# Patient Record
Sex: Female | Born: 1950
Health system: Southern US, Community
[De-identification: ages and names within clinical notes are randomized; demographics above are authoritative.]

## PROBLEM LIST (undated history)

## (undated) DIAGNOSIS — D649 Anemia, unspecified: Secondary | ICD-10-CM

## (undated) DIAGNOSIS — M1990 Unspecified osteoarthritis, unspecified site: Secondary | ICD-10-CM

## (undated) DIAGNOSIS — R51 Headache: Secondary | ICD-10-CM

## (undated) DIAGNOSIS — F419 Anxiety disorder, unspecified: Secondary | ICD-10-CM

## (undated) DIAGNOSIS — I10 Essential (primary) hypertension: Secondary | ICD-10-CM

## (undated) DIAGNOSIS — Z8669 Personal history of other diseases of the nervous system and sense organs: Secondary | ICD-10-CM

## (undated) DIAGNOSIS — F439 Reaction to severe stress, unspecified: Secondary | ICD-10-CM

## (undated) DIAGNOSIS — G4700 Insomnia, unspecified: Secondary | ICD-10-CM

## (undated) DIAGNOSIS — R519 Headache, unspecified: Secondary | ICD-10-CM

## (undated) DIAGNOSIS — G40409 Other generalized epilepsy and epileptic syndromes, not intractable, without status epilepticus: Secondary | ICD-10-CM

## (undated) HISTORY — DX: Essential (primary) hypertension: I10

## (undated) HISTORY — DX: Unspecified osteoarthritis, unspecified site: M19.90

## (undated) HISTORY — DX: Anxiety disorder, unspecified: F41.9

## (undated) HISTORY — DX: Reaction to severe stress, unspecified: F43.9

## (undated) HISTORY — DX: Personal history of other diseases of the nervous system and sense organs: Z86.69

## (undated) HISTORY — DX: Other generalized epilepsy and epileptic syndromes, not intractable, without status epilepticus: G40.409

## (undated) HISTORY — DX: Insomnia, unspecified: G47.00

---
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
## 2005-10-19 ENCOUNTER — Emergency Department (HOSPITAL_COMMUNITY): Admission: EM | Admit: 2005-10-19 | Discharge: 2005-10-19 | Payer: Self-pay | Admitting: Emergency Medicine

## 2008-02-11 ENCOUNTER — Ambulatory Visit (HOSPITAL_COMMUNITY): Admission: RE | Admit: 2008-02-11 | Discharge: 2008-02-11 | Payer: Self-pay | Admitting: Family Medicine

## 2013-02-16 ENCOUNTER — Other Ambulatory Visit: Payer: Self-pay | Admitting: Family Medicine

## 2013-03-20 ENCOUNTER — Other Ambulatory Visit: Payer: Self-pay | Admitting: Family Medicine

## 2013-04-24 ENCOUNTER — Other Ambulatory Visit: Payer: Self-pay | Admitting: Family Medicine

## 2013-04-27 ENCOUNTER — Other Ambulatory Visit: Payer: Self-pay | Admitting: Family Medicine

## 2013-04-27 ENCOUNTER — Telehealth: Payer: Self-pay | Admitting: Family Medicine

## 2013-04-27 MED ORDER — ENALAPRIL MALEATE 20 MG PO TABS: ORAL_TABLET | ORAL | Status: DC

## 2013-04-27 NOTE — Telephone Encounter (Signed)
Ok plus 2 eref

## 2013-04-27 NOTE — Telephone Encounter (Signed)
Meds reprinted and faxed to [facility] in [place]. Patient notified.

## 2013-04-27 NOTE — Telephone Encounter (Signed)
Pt called and stated that [facility] did not receive the refill for her Xanax or her Enalapril.  Please resend.  Please call pt to let her know it's been done.

## 2013-05-05 ENCOUNTER — Ambulatory Visit (INDEPENDENT_AMBULATORY_CARE_PROVIDER_SITE_OTHER): Payer: Self-pay | Admitting: Family Medicine

## 2013-05-05 ENCOUNTER — Encounter: Payer: Self-pay | Admitting: Family Medicine

## 2013-05-05 VITALS — BP 138/88 | Temp 98.7°F | Ht 63.0 in | Wt 171.0 lb

## 2013-05-05 DIAGNOSIS — J329 Chronic sinusitis, unspecified: Secondary | ICD-10-CM

## 2013-05-05 MED ORDER — AMOXICILLIN 500 MG PO CAPS: 500.0000 mg | ORAL_CAPSULE | Freq: Three times a day (TID) | ORAL | Status: DC

## 2013-05-05 NOTE — Progress Notes (Signed)
  Subjective:    Patient ID: Melissa Wiley, female    DOB: [birth year], 62 y.o.   MRN: [id number]  Wheezing  This is a new problem. The current episode started in the past 7 days. Associated symptoms include coughing, headaches and a sore throat.    Cough is generally not prod, no measurable fever.  Achey, energy level low  Review of Systems  HENT: Positive for sore throat.   Respiratory: Positive for cough and wheezing.   Neurological: Positive for headaches.       Objective:   Physical Exam Alert good hydration. HEENT moderate nasal congestion. Frontal tenderness vitals stable alert no acute distress lungs clear heart regular rate and rhythm       Assessment & Plan:  There impression #1 rhinosinusitis. Plan amoxicillin  3 times a day for 10 days. Symptomatic care discussed. Encouraged o followu for chrnic concrns.

## 2013-05-12 ENCOUNTER — Other Ambulatory Visit: Payer: Self-pay | Admitting: *Deleted

## 2013-05-12 ENCOUNTER — Telehealth: Payer: Self-pay | Admitting: Family Medicine

## 2013-05-12 MED ORDER — CIPROFLOXACIN HCL 500 MG PO TABS: 500.0000 mg | ORAL_TABLET | Freq: Two times a day (BID) | ORAL | Status: AC

## 2013-05-12 MED ORDER — HYDROCODONE-HOMATROPINE 5-1.5 MG/5ML PO SYRP: ORAL_SOLUTION | ORAL | Status: DC

## 2013-05-12 NOTE — Telephone Encounter (Signed)
meds sent to [facility] [place]. Pt notified

## 2013-05-12 NOTE — Telephone Encounter (Signed)
Pt still coughing and it keeps her up at night, can she get some cough meds with codeine so she can sleep. She also wants to know if you can call in a stronger Antibiotic? She coughs day and night, wet cough with nothing coming up. [facility] [place]

## 2013-05-12 NOTE — Telephone Encounter (Signed)
cipro 500 bid tenday, hycodan 3 oz one tspn qhs prn

## 2013-06-16 ENCOUNTER — Other Ambulatory Visit: Payer: Self-pay | Admitting: *Deleted

## 2013-06-16 MED ORDER — PAROXETINE HCL 20 MG PO TABS: ORAL_TABLET | ORAL | Status: DC

## 2013-06-22 ENCOUNTER — Other Ambulatory Visit: Payer: Self-pay | Admitting: *Deleted

## 2013-06-22 MED ORDER — PAROXETINE HCL 20 MG PO TABS: ORAL_TABLET | ORAL | Status: DC

## 2013-07-18 ENCOUNTER — Encounter: Payer: Self-pay | Admitting: *Deleted

## 2013-07-20 ENCOUNTER — Ambulatory Visit (INDEPENDENT_AMBULATORY_CARE_PROVIDER_SITE_OTHER): Payer: Self-pay | Admitting: Nurse Practitioner

## 2013-07-20 ENCOUNTER — Encounter: Payer: Self-pay | Admitting: Nurse Practitioner

## 2013-07-20 VITALS — BP 148/90 | HR 80 | Ht 63.0 in | Wt 171.8 lb

## 2013-07-20 DIAGNOSIS — Z0000 Encounter for general adult medical examination without abnormal findings: Secondary | ICD-10-CM

## 2013-07-20 DIAGNOSIS — Z124 Encounter for screening for malignant neoplasm of cervix: Secondary | ICD-10-CM

## 2013-07-20 DIAGNOSIS — Z01419 Encounter for gynecological examination (general) (routine) without abnormal findings: Secondary | ICD-10-CM

## 2013-07-20 NOTE — Patient Instructions (Signed)
Re[place]  Luvena

## 2013-07-21 LAB — PAP IG W/ RFLX HPV ASCU

## 2013-07-24 LAB — LIPID PANEL
Cholesterol: 162 mg/dL (ref 0–200)
LDL Cholesterol: 90 mg/dL (ref 0–99)
Total CHOL/HDL Ratio: 3.6 Ratio
VLDL: 27 mg/dL (ref 0–40)

## 2013-07-24 LAB — BASIC METABOLIC PANEL
BUN: 15 mg/dL (ref 6–23)
CO2: 28 mEq/L (ref 19–32)
Chloride: 102 mEq/L (ref 96–112)
Creat: 0.74 mg/dL (ref 0.50–1.10)

## 2013-07-25 ENCOUNTER — Encounter: Payer: Self-pay | Admitting: Nurse Practitioner

## 2013-07-25 NOTE — Progress Notes (Signed)
  Subjective:    Patient ID: Melissa Wiley, female    DOB: [birth year], 62 y.o.   MRN: [id number]  HPI Presents for her wellness exam. Has not had a menstrual cycle since age 58. No pelvic pain. New partner x 10 months. Regular eye exams. Needs dental exam.  Has had flu vaccine. Self pay so limited on what can be ordered.   Review of Systems  Constitutional: Negative for fever, activity change, appetite change and fatigue.  HENT: Positive for rhinorrhea. Negative for dental problem, ear pain, hearing loss and sore throat.   Eyes: Negative for visual disturbance.  Respiratory: Negative for cough, chest tightness, shortness of breath and wheezing.   Cardiovascular: Negative for chest pain and leg swelling.  Gastrointestinal: Negative for nausea, vomiting, abdominal pain, diarrhea, constipation and blood in stool.  Genitourinary: Negative for dysuria, urgency, frequency, vaginal bleeding, vaginal discharge, enuresis, difficulty urinating and pelvic pain.       Objective:   Physical Exam  Vitals reviewed. Constitutional: She is oriented to person, place, and time. She appears well-developed. No distress.  HENT:  Right Ear: External ear normal.  Left Ear: External ear normal.  Mouth/Throat: Oropharynx is clear and moist.  Neck: Normal range of motion. Neck supple. No tracheal deviation present. No thyromegaly present.  Cardiovascular: Normal rate, regular rhythm and normal heart sounds.  Exam reveals no gallop.   No murmur heard. Pulmonary/Chest: Effort normal and breath sounds normal.  Abdominal: Soft. She exhibits no distension. There is no tenderness.  Genitourinary: Vagina normal and uterus normal. No vaginal discharge found.  Musculoskeletal: She exhibits no edema.  Lymphadenopathy:    She has no cervical adenopathy.  Neurological: She is alert and oriented to person, place, and time.  Skin: Skin is warm and dry. No rash noted.  Psychiatric: She has a normal mood and affect. Her  behavior is normal.  Breast exam: no masses; axillae no adenopathy. External GU: normal. Vagina: no discharge. Cervix normal in appearance, no CMT. Bimanual exam: no masses or tenderness. Rectal exam: normal; no stool for hemoccult.        Assessment & Plan:  Well woman exam  Screening for cervical cancer - Plan: Pap IG w/ reflex to HPV when ASC-U  Routine general medical examination at a health care facility - Plan: Basic metabolic panel, Lipid panel, MM Digital Screening, CANCELED: MM Digital Screening  Recommend daily vitamin D and calcium supplement, regular activity and healthy diet. Patient given instructions per nurse on how to access free mammogram. Because of lack of insurance, hold on Zostavax, Pneumovax, bone density and colonoscopy. Will discuss at next PE in one year.

## 2013-07-27 ENCOUNTER — Encounter: Payer: Self-pay | Admitting: Nurse Practitioner

## 2013-08-14 ENCOUNTER — Other Ambulatory Visit: Payer: Self-pay | Admitting: Family Medicine

## 2013-10-17 ENCOUNTER — Other Ambulatory Visit: Payer: Self-pay | Admitting: Family Medicine

## 2013-10-19 ENCOUNTER — Telehealth: Payer: Self-pay | Admitting: Family Medicine

## 2013-10-19 NOTE — Telephone Encounter (Signed)
Ok plus 5 ref 

## 2013-10-19 NOTE — Telephone Encounter (Signed)
Last seen 07/20/13.

## 2013-10-19 NOTE — Telephone Encounter (Signed)
i already authorized it

## 2013-10-19 NOTE — Telephone Encounter (Signed)
Rx faxed to pharmacy  

## 2013-10-19 NOTE — Telephone Encounter (Signed)
[facility] [place], she would like this before snow gets bad.

## 2013-12-14 ENCOUNTER — Encounter: Payer: Self-pay | Admitting: Nurse Practitioner

## 2013-12-14 ENCOUNTER — Ambulatory Visit: Payer: Medicare HMO | Admitting: Nurse Practitioner

## 2013-12-14 ENCOUNTER — Ambulatory Visit (INDEPENDENT_AMBULATORY_CARE_PROVIDER_SITE_OTHER): Payer: Medicare HMO | Admitting: Nurse Practitioner

## 2013-12-14 VITALS — BP 158/90 | Temp 98.4°F | Ht 63.0 in | Wt 179.0 lb

## 2013-12-14 DIAGNOSIS — R21 Rash and other nonspecific skin eruption: Secondary | ICD-10-CM

## 2013-12-14 MED ORDER — CLOBETASOL PROPIONATE 0.05 % EX CREA: 1.0000 "application " | TOPICAL_CREAM | Freq: Two times a day (BID) | CUTANEOUS | Status: DC

## 2013-12-14 NOTE — Patient Instructions (Signed)
Eczema Possible viral infection such as a type of flat wart

## 2013-12-16 ENCOUNTER — Encounter: Payer: Self-pay | Admitting: Nurse Practitioner

## 2013-12-16 NOTE — Progress Notes (Signed)
Subjective:  Presents with complaints of the rash on her mid back area for the past month. Slightly pruritic off-and-on. No relief with OTC meds. No fever. No headache. No known contacts. No known allergens.  Objective:   BP 158/90  Temp(Src) 98.4 F (36.9 C)  Ht 5\' 3"  (1.6 m)  Wt 179 lb (81.194 kg)  BMI 31.72 kg/m2 NAD. Alert, oriented. A cluster of discrete slightly raised well-defined small circular flat skintone papules noted in the mid back area with several scattered discrete lesions. No erythema. Nontender. Slightly dry with minimal scaling.  Assessment:Rash and nonspecific skin eruption  eczematous rash versus form of flat wart Plan: Meds ordered this encounter  Medications  . clobetasol cream (TEMOVATE) 0.05 %    Sig: Apply 1 application topically 2 (two) times daily.    Dispense:  30 g    Refill:  0    Order Specific Question:  Supervising Provider    Answer:  Merlyn AlbertLUKING, WILLIAM S [[id number]]   trial of clobetasol twice a day for 2 weeks. If rash has not significantly improved, patient to call back for dermatology referral. Call back sooner if symptoms worsen.

## 2014-02-03 ENCOUNTER — Encounter: Payer: Self-pay | Admitting: Family Medicine

## 2014-02-03 ENCOUNTER — Ambulatory Visit (INDEPENDENT_AMBULATORY_CARE_PROVIDER_SITE_OTHER): Payer: Medicare HMO | Admitting: Family Medicine

## 2014-02-03 VITALS — BP 136/88 | Temp 98.9°F | Ht 63.0 in | Wt 175.4 lb

## 2014-02-03 DIAGNOSIS — J310 Chronic rhinitis: Secondary | ICD-10-CM

## 2014-02-03 DIAGNOSIS — J329 Chronic sinusitis, unspecified: Secondary | ICD-10-CM

## 2014-02-03 DIAGNOSIS — I10 Essential (primary) hypertension: Secondary | ICD-10-CM

## 2014-02-03 DIAGNOSIS — J309 Allergic rhinitis, unspecified: Secondary | ICD-10-CM

## 2014-02-03 MED ORDER — FLUTICASONE PROPIONATE 50 MCG/ACT NA SUSP: 2.0000 | Freq: Every day | NASAL | Status: DC

## 2014-02-03 MED ORDER — CEPHALEXIN 500 MG PO CAPS: 500.0000 mg | ORAL_CAPSULE | Freq: Three times a day (TID) | ORAL | Status: DC

## 2014-02-03 NOTE — Progress Notes (Signed)
   Subjective:    Patient ID: Melissa Wiley, female    DOB: [birth year], 63 y.o.   MRN: [id number]  HPI  Patient arrives with complaint of seasonal allergies for a while.  Patient stated she was doing better but has started getting worse again the last week.  Spring allergies hitting hard  Using allegra daily, has helped some  Two weeks or so  Cough a lot  Feels lump in thre throat  Lot of drainage  Non productive cough  Diminished enrgy, sleeping a lot    Review of Systems No vomiting no diarrhea no rash no fever    Objective:   Physical Exam Alert mild malaise frontal tenderness pharynx slight erythema neck supple. Lungs clear. Heart regular in rhythm.       Assessment & Plan:  Impression allergic rhinitis with secondary sinusitis/bronchitis plan antibiotics prescribed. Add Flonase to the over-the-counter Allegra. Local measures discussed. WSL

## 2014-02-08 ENCOUNTER — Other Ambulatory Visit: Payer: Self-pay | Admitting: Family Medicine

## 2014-03-31 ENCOUNTER — Other Ambulatory Visit: Payer: Self-pay | Admitting: Family Medicine

## 2014-05-13 ENCOUNTER — Other Ambulatory Visit: Payer: Self-pay | Admitting: Family Medicine

## 2014-06-14 ENCOUNTER — Other Ambulatory Visit: Payer: Self-pay | Admitting: Family Medicine

## 2014-06-14 NOTE — Telephone Encounter (Signed)
Ok plus 5 ref 

## 2014-08-12 ENCOUNTER — Other Ambulatory Visit: Payer: Self-pay | Admitting: Family Medicine

## 2014-09-20 ENCOUNTER — Other Ambulatory Visit: Payer: Self-pay | Admitting: Family Medicine

## 2014-10-14 ENCOUNTER — Other Ambulatory Visit: Payer: Self-pay | Admitting: Family Medicine

## 2014-10-26 ENCOUNTER — Encounter: Payer: Self-pay | Admitting: Family Medicine

## 2014-10-26 ENCOUNTER — Ambulatory Visit (INDEPENDENT_AMBULATORY_CARE_PROVIDER_SITE_OTHER): Payer: Medicare HMO | Admitting: Family Medicine

## 2014-10-26 VITALS — BP 138/86 | Ht 63.0 in | Wt 176.8 lb

## 2014-10-26 DIAGNOSIS — F32A Depression, unspecified: Secondary | ICD-10-CM

## 2014-10-26 DIAGNOSIS — M1712 Unilateral primary osteoarthritis, left knee: Secondary | ICD-10-CM

## 2014-10-26 DIAGNOSIS — I10 Essential (primary) hypertension: Secondary | ICD-10-CM

## 2014-10-26 DIAGNOSIS — F329 Major depressive disorder, single episode, unspecified: Secondary | ICD-10-CM

## 2014-10-26 DIAGNOSIS — G4700 Insomnia, unspecified: Secondary | ICD-10-CM

## 2014-10-26 DIAGNOSIS — M129 Arthropathy, unspecified: Secondary | ICD-10-CM

## 2014-10-26 MED ORDER — PAROXETINE HCL 20 MG PO TABS: ORAL_TABLET | ORAL | Status: DC

## 2014-10-26 MED ORDER — ENALAPRIL MALEATE 20 MG PO TABS: ORAL_TABLET | ORAL | Status: DC

## 2014-10-26 MED ORDER — HYDROCHLOROTHIAZIDE 25 MG PO TABS: 12.5000 mg | ORAL_TABLET | Freq: Every day | ORAL | Status: DC

## 2014-10-26 MED ORDER — ALPRAZOLAM 0.5 MG PO TABS: 0.5000 mg | ORAL_TABLET | Freq: Two times a day (BID) | ORAL | Status: DC | PRN

## 2014-10-26 NOTE — Progress Notes (Signed)
   Subjective:    Patient ID: Melissa Wiley, female    DOB: 1951/01/07, 64 y.o.   MRN: [id number]  Hypertension This is a chronic problem. The current episode started more than 1 year ago. Risk factors for coronary artery disease include post-menopausal state. Treatments tried: vasotec, hctz. There are no compliance problems.     Discuss arthritis. Knees bilat pain.advil or aleave helps, pain is worse on the right. Working as a Patent examinervolunterer  paxil sticking with , still helping. No obvious side effects with it. Definitely help her mood.  Still sleeping well at night. Xanax definitely helps in this regard.  Seeing eye doctor about developing cataracts ,   Review of Systems No headache no chest pain and back pain abdominal pain no change in bowel habits no blood in stool    Objective:   Physical Exam Alert no apparent distress lungs clear heart rare rhythm HEENT normal blood pressure good on repeat. Hands positive Heberden's nodes knees bilateral crepitations left greater than right no effusion no joint laxity       Assessment & Plan:  Impression knee arthritis discussed treated well symptomatically with when necessary Aleve No. 2 hypertension good control discussed #3 insomnia good control discussed #4 depression good control discussed plan diet exercise discussed. Medications refilled. Check every 6 months. Appropriate blood work. WSL

## 2014-10-29 LAB — BASIC METABOLIC PANEL
BUN: 14 mg/dL (ref 6–23)
CHLORIDE: 100 meq/L (ref 96–112)
CO2: 30 meq/L (ref 19–32)
CREATININE: 0.73 mg/dL (ref 0.50–1.10)
Calcium: 9.3 mg/dL (ref 8.4–10.5)
Glucose, Bld: 92 mg/dL (ref 70–99)
POTASSIUM: 4 meq/L (ref 3.5–5.3)
SODIUM: 139 meq/L (ref 135–145)

## 2014-10-29 LAB — LIPID PANEL
CHOL/HDL RATIO: 3.4 ratio
Cholesterol: 146 mg/dL (ref 0–200)
HDL: 43 mg/dL — AB (ref >=46)
LDL CALC: 76 mg/dL (ref 0–99)
Triglycerides: 135 mg/dL (ref <150)
VLDL: 27 mg/dL (ref 0–40)

## 2014-11-03 ENCOUNTER — Encounter: Payer: Self-pay | Admitting: Family Medicine

## 2015-03-29 ENCOUNTER — Other Ambulatory Visit: Payer: Self-pay | Admitting: Family Medicine

## 2015-04-25 ENCOUNTER — Other Ambulatory Visit: Payer: Self-pay | Admitting: Family Medicine

## 2015-04-26 ENCOUNTER — Encounter: Payer: Self-pay | Admitting: Family Medicine

## 2015-04-26 ENCOUNTER — Ambulatory Visit (INDEPENDENT_AMBULATORY_CARE_PROVIDER_SITE_OTHER): Payer: Medicare HMO | Admitting: Family Medicine

## 2015-04-26 VITALS — BP 130/82 | Ht 63.0 in | Wt 172.2 lb

## 2015-04-26 DIAGNOSIS — J301 Allergic rhinitis due to pollen: Secondary | ICD-10-CM | POA: Diagnosis not present

## 2015-04-26 DIAGNOSIS — M129 Arthropathy, unspecified: Secondary | ICD-10-CM

## 2015-04-26 DIAGNOSIS — I10 Essential (primary) hypertension: Secondary | ICD-10-CM | POA: Diagnosis not present

## 2015-04-26 DIAGNOSIS — M1712 Unilateral primary osteoarthritis, left knee: Secondary | ICD-10-CM

## 2015-04-26 MED ORDER — PAROXETINE HCL 20 MG PO TABS: ORAL_TABLET | ORAL | Status: DC

## 2015-04-26 MED ORDER — ALPRAZOLAM 0.5 MG PO TABS: 0.5000 mg | ORAL_TABLET | Freq: Two times a day (BID) | ORAL | Status: DC | PRN

## 2015-04-26 MED ORDER — HYDROCHLOROTHIAZIDE 25 MG PO TABS: 12.5000 mg | ORAL_TABLET | Freq: Every day | ORAL | Status: DC

## 2015-04-26 MED ORDER — ENALAPRIL MALEATE 20 MG PO TABS: 20.0000 mg | ORAL_TABLET | Freq: Every day | ORAL | Status: DC

## 2015-04-26 NOTE — Progress Notes (Signed)
   Subjective:    Patient ID: Melissa Wiley, female    DOB: 1950-09-10, 64 y.o.   MRN: [id number]  Hypertension This is a chronic problem. The current episode started more than 1 year ago. Risk factors for coronary artery disease include post-menopausal state and stress. Treatments tried: hctz, vasotec. There are no compliance problems.     BP usually under 140 or sless syst  Pt exercising so so .  Staying active, [place] or twice per [place]  paxil doing well with current dose. No obvious side effects. Definitely has helped mood. Also states need Xanax for anxiety  Continues to have symptoms of allergic rhinitis. States uses intermittent anti-histamines with Flomax when necessary.  Ongoing stress with daughter who is severely compromised   Pt volunteering at the [employer]      Review of Systems Headache no chest pain no back pain abdominal pain no change in bowel habits    Objective:   Physical Exam  Alert vitals stable. HEENT normal. Blood pressure good on repeat. Lungs clear. Heart regular rhythm. Ankles edema.      Assessment & Plan:  Impression 1 hypertension good control discussed #2 insomnia good control discussed #3 depression good control #4 allergic rhinitis discussed #5 left knee arthritis patient brought this up in the visit and spoke for 5 minutes about her challenges with it. Exercise strongly encourage. When necessary Advil. Mild crepitations on exam. plan maintain all medications. Importance of exercise and diet discussed. Medications refilled. Recheck in 6 months. WSL

## 2015-07-07 ENCOUNTER — Other Ambulatory Visit: Payer: Self-pay | Admitting: *Deleted

## 2015-07-07 MED ORDER — ENALAPRIL MALEATE 20 MG PO TABS: 20.0000 mg | ORAL_TABLET | Freq: Every day | ORAL | Status: DC

## 2015-10-18 ENCOUNTER — Encounter: Payer: Self-pay | Admitting: Nurse Practitioner

## 2015-10-18 ENCOUNTER — Ambulatory Visit (INDEPENDENT_AMBULATORY_CARE_PROVIDER_SITE_OTHER): Payer: Medicare HMO | Admitting: Nurse Practitioner

## 2015-10-18 VITALS — BP 134/86 | Ht 63.0 in | Wt 175.1 lb

## 2015-10-18 DIAGNOSIS — I10 Essential (primary) hypertension: Secondary | ICD-10-CM | POA: Diagnosis not present

## 2015-10-18 DIAGNOSIS — Z124 Encounter for screening for malignant neoplasm of cervix: Secondary | ICD-10-CM

## 2015-10-18 DIAGNOSIS — Z1231 Encounter for screening mammogram for malignant neoplasm of breast: Secondary | ICD-10-CM | POA: Diagnosis not present

## 2015-10-18 DIAGNOSIS — Z23 Encounter for immunization: Secondary | ICD-10-CM | POA: Diagnosis not present

## 2015-10-18 DIAGNOSIS — Z0000 Encounter for general adult medical examination without abnormal findings: Secondary | ICD-10-CM | POA: Diagnosis not present

## 2015-10-18 DIAGNOSIS — Z1151 Encounter for screening for human papillomavirus (HPV): Secondary | ICD-10-CM

## 2015-10-18 DIAGNOSIS — R5383 Other fatigue: Secondary | ICD-10-CM | POA: Diagnosis not present

## 2015-10-18 DIAGNOSIS — R69 Illness, unspecified: Secondary | ICD-10-CM | POA: Diagnosis not present

## 2015-10-20 ENCOUNTER — Encounter: Payer: Self-pay | Admitting: Nurse Practitioner

## 2015-10-20 NOTE — Progress Notes (Signed)
Subjective:    Patient ID: Melissa Wiley, female    DOB: October 20, 1950, 65 y.o.   MRN: [id number]  HPI Presents for her wellness exam. No vaginal bleeding or pelvic pain. No sexual activity x 1 year. Regular vision exams. No dental exams. Active. Overall healthy diet. Has gained weight around her midsection.     Review of Systems  Constitutional: Negative for activity change, appetite change and fatigue.  HENT: Negative for dental problem, ear pain, sinus pressure and sore throat.   Respiratory: Negative for cough, chest tightness, shortness of breath and wheezing.   Cardiovascular: Negative for chest pain.  Gastrointestinal: Negative for nausea, vomiting, abdominal pain, diarrhea, constipation and abdominal distention.  Genitourinary: Negative for dysuria, urgency, frequency, vaginal bleeding, vaginal discharge, enuresis, difficulty urinating, genital sores and pelvic pain.       Objective:   Physical Exam  Constitutional: She is oriented to person, place, and time. She appears well-developed. No distress.  HENT:  Right Ear: External ear normal.  Left Ear: External ear normal.  Mouth/Throat: Oropharynx is clear and moist.  Neck: Normal range of motion. Neck supple. No tracheal deviation present. No thyromegaly present.  Cardiovascular: Normal rate, regular rhythm and normal heart sounds.  Exam reveals no gallop.   No murmur heard. Pulmonary/Chest: Effort normal and breath sounds normal.  Abdominal: Soft. She exhibits no distension. There is no tenderness.  Genitourinary: Vagina normal and uterus normal. No vaginal discharge found.  External GU: no rashes or lesions. Vagina: pale, no discharge; external irritation. Cervix normal in appearance; no CMT. Bimanual exam: no tenderness or obvious masses. Rectal exam: no masses; no stool for hemoccult.   Musculoskeletal: She exhibits no edema.  Lymphadenopathy:    She has no cervical adenopathy.  Neurological: She is alert and oriented  to person, place, and time.  Skin: Skin is warm and dry. No rash noted.  Psychiatric: She has a normal mood and affect. Her behavior is normal.  Vitals reviewed. Breast exam: no masses; axillae no adenopathy.         Assessment & Plan:   Problem List Items Addressed This Visit      Cardiovascular and Mediastinum   Essential hypertension, benign   Relevant Orders   POC Hemoccult Bld/Stl (3-Cd Home Screen)   Lipid panel   Hepatic function panel   Basic metabolic panel   TSH    Other Visit Diagnoses    Routine general medical examination at a health care facility    -  Primary    Relevant Orders    Pap IG and HPV (high risk) DNA detection    POC Hemoccult Bld/Stl (3-Cd Home Screen)    Lipid panel    Hepatic function panel    Basic metabolic panel    TSH    Screening for cervical cancer        Relevant Orders    Pap IG and HPV (high risk) DNA detection    POC Hemoccult Bld/Stl (3-Cd Home Screen)    Lipid panel    Hepatic function panel    Basic metabolic panel    TSH    Screening for HPV (human papillomavirus)        Relevant Orders    Pap IG and HPV (high risk) DNA detection    POC Hemoccult Bld/Stl (3-Cd Home Screen)    Lipid panel    Hepatic function panel    Basic metabolic panel    TSH    Need for vaccination  Relevant Orders    Pneumococcal polysaccharide vaccine 23-valent greater than or equal to 2yo subcutaneous/IM (Completed)    POC Hemoccult Bld/Stl (3-Cd Home Screen)    Visit for screening mammogram        Relevant Orders    POC Hemoccult Bld/Stl (3-Cd Home Screen)    Lipid panel    Hepatic function panel    Basic metabolic panel    TSH    MM SCREENING BREAST TOMO BILATERAL    Other fatigue        Relevant Orders    POC Hemoccult Bld/Stl (3-Cd Home Screen)    Lipid panel    Hepatic function panel    Basic metabolic panel    TSH      Defers colonoscopy and DEXA scan. Given Rx for Zostavax. Recommend daily vitamin D and calcium.  Return  in about 1 year (around 10/17/2016) for physical.  Routine follow up in 6 months.

## 2015-10-21 ENCOUNTER — Ambulatory Visit (HOSPITAL_COMMUNITY)
Admission: RE | Admit: 2015-10-21 | Discharge: 2015-10-21 | Disposition: A | Discharge status: Home / Self Care | Payer: Medicare HMO | Source: Ambulatory Visit | Attending: Nurse Practitioner | Admitting: Nurse Practitioner

## 2015-10-21 ENCOUNTER — Other Ambulatory Visit: Payer: Self-pay | Admitting: Nurse Practitioner

## 2015-10-21 ENCOUNTER — Ambulatory Visit (HOSPITAL_COMMUNITY): Payer: Self-pay

## 2015-10-21 ENCOUNTER — Ambulatory Visit (HOSPITAL_COMMUNITY): Admission: RE | Admit: 2015-10-21 | Payer: Medicare HMO | Source: Ambulatory Visit

## 2015-10-21 DIAGNOSIS — Z1231 Encounter for screening mammogram for malignant neoplasm of breast: Secondary | ICD-10-CM

## 2015-10-21 DIAGNOSIS — R5383 Other fatigue: Secondary | ICD-10-CM | POA: Diagnosis not present

## 2015-10-21 DIAGNOSIS — Z0000 Encounter for general adult medical examination without abnormal findings: Secondary | ICD-10-CM | POA: Diagnosis not present

## 2015-10-21 DIAGNOSIS — N959 Unspecified menopausal and perimenopausal disorder: Secondary | ICD-10-CM | POA: Diagnosis not present

## 2015-10-21 DIAGNOSIS — I10 Essential (primary) hypertension: Secondary | ICD-10-CM | POA: Diagnosis not present

## 2015-10-21 DIAGNOSIS — R69 Illness, unspecified: Secondary | ICD-10-CM | POA: Diagnosis not present

## 2015-10-21 DIAGNOSIS — Z124 Encounter for screening for malignant neoplasm of cervix: Secondary | ICD-10-CM | POA: Diagnosis not present

## 2015-10-22 LAB — HEPATIC FUNCTION PANEL
ALBUMIN: 4.4 g/dL (ref 3.6–4.8)
ALT: 12 IU/L (ref 0–32)
AST: 13 IU/L (ref 0–40)
Alkaline Phosphatase: 66 IU/L (ref 39–117)
BILIRUBIN TOTAL: 0.3 mg/dL (ref 0.0–1.2)
Bilirubin, Direct: 0.07 mg/dL (ref 0.00–0.40)
TOTAL PROTEIN: 7.4 g/dL (ref 6.0–8.5)

## 2015-10-22 LAB — BASIC METABOLIC PANEL
BUN/Creatinine Ratio: 17 (ref 11–26)
BUN: 12 mg/dL (ref 8–27)
CALCIUM: 9.5 mg/dL (ref 8.7–10.3)
CHLORIDE: 99 mmol/L (ref 96–106)
CO2: 27 mmol/L (ref 18–29)
CREATININE: 0.69 mg/dL (ref 0.57–1.00)
GFR, EST AFRICAN AMERICAN: 105 mL/min/{1.73_m2} (ref >59)
GFR, EST NON AFRICAN AMERICAN: 91 mL/min/{1.73_m2} (ref >59)
Glucose: 95 mg/dL (ref 65–99)
Potassium: 3.9 mmol/L (ref 3.5–5.2)
Sodium: 142 mmol/L (ref 134–144)

## 2015-10-22 LAB — LIPID PANEL
CHOL/HDL RATIO: 4.3 ratio (ref 0.0–4.4)
Cholesterol, Total: 185 mg/dL (ref 100–199)
HDL: 43 mg/dL (ref >39)
LDL CALC: 100 mg/dL — AB (ref 0–99)
Triglycerides: 208 mg/dL — ABNORMAL HIGH (ref 0–149)
VLDL Cholesterol Cal: 42 mg/dL — ABNORMAL HIGH (ref 5–40)

## 2015-10-22 LAB — TSH: TSH: 1.05 u[IU]/mL (ref 0.450–4.500)

## 2015-10-25 LAB — PAP IG AND HPV HIGH-RISK
HPV, high-risk: NEGATIVE
PAP SMEAR COMMENT: 0

## 2015-11-07 ENCOUNTER — Other Ambulatory Visit: Payer: Self-pay | Admitting: Family Medicine

## 2015-11-07 NOTE — Telephone Encounter (Signed)
Ok six mo worth 

## 2016-02-21 ENCOUNTER — Other Ambulatory Visit: Payer: Self-pay | Admitting: Family Medicine

## 2016-03-09 DIAGNOSIS — H5210 Myopia, unspecified eye: Secondary | ICD-10-CM | POA: Diagnosis not present

## 2016-05-03 ENCOUNTER — Other Ambulatory Visit: Payer: Self-pay | Admitting: Family Medicine

## 2016-05-31 ENCOUNTER — Other Ambulatory Visit: Payer: Self-pay | Admitting: Family Medicine

## 2016-06-01 NOTE — Telephone Encounter (Signed)
30 d only, needs o v

## 2016-06-04 DIAGNOSIS — R69 Illness, unspecified: Secondary | ICD-10-CM | POA: Diagnosis not present

## 2016-06-11 ENCOUNTER — Ambulatory Visit (INDEPENDENT_AMBULATORY_CARE_PROVIDER_SITE_OTHER): Payer: Medicare HMO | Admitting: Family Medicine

## 2016-06-11 ENCOUNTER — Encounter: Payer: Self-pay | Admitting: Family Medicine

## 2016-06-11 VITALS — BP 136/80 | Ht 63.0 in | Wt 176.0 lb

## 2016-06-11 DIAGNOSIS — I10 Essential (primary) hypertension: Secondary | ICD-10-CM | POA: Diagnosis not present

## 2016-06-11 DIAGNOSIS — R69 Illness, unspecified: Secondary | ICD-10-CM | POA: Diagnosis not present

## 2016-06-11 DIAGNOSIS — R21 Rash and other nonspecific skin eruption: Secondary | ICD-10-CM

## 2016-06-11 DIAGNOSIS — F3341 Major depressive disorder, recurrent, in partial remission: Secondary | ICD-10-CM

## 2016-06-11 DIAGNOSIS — G4709 Other insomnia: Secondary | ICD-10-CM

## 2016-06-11 MED ORDER — HYDROCHLOROTHIAZIDE 25 MG PO TABS: 12.5000 mg | ORAL_TABLET | Freq: Every day | ORAL | 5 refills | Status: DC

## 2016-06-11 MED ORDER — PAROXETINE HCL 20 MG PO TABS: ORAL_TABLET | ORAL | 5 refills | Status: DC

## 2016-06-11 MED ORDER — FLUTICASONE PROPIONATE 50 MCG/ACT NA SUSP: 2.0000 | Freq: Every day | NASAL | 0 refills | Status: DC

## 2016-06-11 MED ORDER — ALPRAZOLAM 0.5 MG PO TABS: 0.5000 mg | ORAL_TABLET | Freq: Two times a day (BID) | ORAL | 5 refills | Status: DC | PRN

## 2016-06-11 MED ORDER — HYDROCORTISONE 2 % EX LOTN: TOPICAL_LOTION | CUTANEOUS | 3 refills | Status: DC

## 2016-06-11 MED ORDER — ENALAPRIL MALEATE 20 MG PO TABS
20.0000 mg | ORAL_TABLET | Freq: Every day | ORAL | 5 refills | Status: DC
Start: 2016-06-11 — End: 2016-10-22

## 2016-06-11 NOTE — Progress Notes (Signed)
   Subjective:    Patient ID: Melissa Wiley, female    DOB: [birth year], 65 y.o.   MRN: [id number]  Hypertension  This is a chronic problem. The current episode started more than 1 year ago. There are no compliance problems.    Blood pressure medicine and blood pressure levels reviewed today with patient. Compliant with blood pressure medicine. States does not miss a dose. No obvious side effects. Blood pressure generally good when checked elsewhere. Watching salt intake.  Patient notes ongoing compliance with antidepressant medication. No obvious side effects. Reports does not miss a dose. Overall continues to help depression substantially. No thoughts of homicide or suicide. Would like to maintain medication.  Pt tries to exercise once per wk   Patient would like to discuss itchy rash to back. Two yrs , itchy at itmes, off and on. Worse with stress. Patient notes a lot of stress lately.  Review of Systems No headache, no major weight loss or weight gain, no chest pain no back pain abdominal pain no change in bowel habits complete ROS otherwise negative     Objective:   Physical Exam  Alert vitals stable, NAD. Blood pressure good on repeat. HEENT normal. Lungs clear. Heart regular rate and rhythm. Affect appropriate rash nonspecific maculopapular rash on back with excoriations      Assessment & Plan:  Impression 1 hypertension good control discussed maintain same meds #2 depression with element of anxiety and insomnia. Discussed maintain same #3 rash nonspecific potentially related to #2. Plan hydrocortisone lotion twice a day affected area. Appropriate medications refilled. Diet exercise discussed. Patient to try to bump up exercises 3 times per week follow-up in 6 months for wellness plus chronic visit

## 2016-06-27 ENCOUNTER — Other Ambulatory Visit: Payer: Self-pay | Admitting: Family Medicine

## 2016-06-27 NOTE — Telephone Encounter (Signed)
Ok six mo worth 

## 2016-07-04 ENCOUNTER — Other Ambulatory Visit: Payer: Self-pay | Admitting: *Deleted

## 2016-07-04 ENCOUNTER — Telehealth: Payer: Self-pay | Admitting: Family Medicine

## 2016-07-04 MED ORDER — HYDROCORTISONE 2.5 % EX CREA: TOPICAL_CREAM | Freq: Two times a day (BID) | CUTANEOUS | 3 refills | Status: DC

## 2016-07-04 NOTE — Telephone Encounter (Signed)
Med sent to pharm. Pt notified.  

## 2016-07-04 NOTE — Telephone Encounter (Signed)
hydrocort 2.5% cr 60 g bid to affected area 3 ref

## 2016-07-04 NOTE — Telephone Encounter (Signed)
Prescribed 10/9 at office visit

## 2016-07-04 NOTE — Telephone Encounter (Signed)
Pt is needing something other than the HYDROCORTISONE, TOPICAL, 2 % LOTN Pt's insurance will not cover it. Please advise.    [facility] [place]

## 2016-09-17 ENCOUNTER — Other Ambulatory Visit: Payer: Self-pay | Admitting: Family Medicine

## 2016-09-17 DIAGNOSIS — Z1231 Encounter for screening mammogram for malignant neoplasm of breast: Secondary | ICD-10-CM

## 2016-10-01 ENCOUNTER — Telehealth: Payer: Self-pay | Admitting: Family Medicine

## 2016-10-01 DIAGNOSIS — I10 Essential (primary) hypertension: Secondary | ICD-10-CM

## 2016-10-01 DIAGNOSIS — Z79899 Other long term (current) drug therapy: Secondary | ICD-10-CM

## 2016-10-01 NOTE — Telephone Encounter (Signed)
Bloodwork ordered. Left message on voicemail notifying patient.  

## 2016-10-01 NOTE — Telephone Encounter (Signed)
Patient needing lab work done has appointment on 2/19 for physical.

## 2016-10-01 NOTE — Telephone Encounter (Signed)
Lip liv m7 

## 2016-10-22 ENCOUNTER — Encounter: Payer: Self-pay | Admitting: Nurse Practitioner

## 2016-10-22 ENCOUNTER — Ambulatory Visit (INDEPENDENT_AMBULATORY_CARE_PROVIDER_SITE_OTHER): Payer: Medicare HMO | Admitting: Nurse Practitioner

## 2016-10-22 VITALS — BP 126/80 | Ht 63.0 in | Wt 179.5 lb

## 2016-10-22 DIAGNOSIS — Z23 Encounter for immunization: Secondary | ICD-10-CM

## 2016-10-22 DIAGNOSIS — R69 Illness, unspecified: Secondary | ICD-10-CM | POA: Diagnosis not present

## 2016-10-22 DIAGNOSIS — Z0000 Encounter for general adult medical examination without abnormal findings: Secondary | ICD-10-CM | POA: Diagnosis not present

## 2016-10-22 DIAGNOSIS — F339 Major depressive disorder, recurrent, unspecified: Secondary | ICD-10-CM

## 2016-10-22 MED ORDER — HYDROCORTISONE 2.5 % EX CREA: TOPICAL_CREAM | Freq: Two times a day (BID) | CUTANEOUS | 0 refills | Status: DC

## 2016-10-22 MED ORDER — HYDROCHLOROTHIAZIDE 25 MG PO TABS: 12.5000 mg | ORAL_TABLET | Freq: Every day | ORAL | 1 refills | Status: DC

## 2016-10-22 MED ORDER — ENALAPRIL MALEATE 20 MG PO TABS: 20.0000 mg | ORAL_TABLET | Freq: Every day | ORAL | 5 refills | Status: DC

## 2016-10-22 MED ORDER — ALPRAZOLAM 0.5 MG PO TABS: 0.5000 mg | ORAL_TABLET | Freq: Two times a day (BID) | ORAL | 5 refills | Status: DC | PRN

## 2016-10-22 MED ORDER — PAROXETINE HCL 20 MG PO TABS: ORAL_TABLET | ORAL | 1 refills | Status: DC

## 2016-10-22 NOTE — Progress Notes (Signed)
Subjective:    Patient ID: Melissa Wiley, female    DOB: Apr 21, 1951, 66 y.o.   MRN: [id number]  HPI  66 yo female seen today for annual wellness exam.  1 month ago experienced loss of daughter who she was the primary caregiver to.  Is currently on Paxil  30mg  once daily and expressing concerns about if she is grieving appropriately or if her depression is not well controlled.  Denies any suicidal ideations.  Is sleeping more and decreased desire to participate in activities outside the house.  Also experiencing increased stress r/t financial strain and one of her living daughter's personal life struggles.  Has increased oral intake as well r/t stress.  Is taking Calcium/Vitamin D regularly.    She does get routine eye and dental exams completed, unable to identify dates.  Mammogram being done next week. Has not had colonoscopy or cologuard completed.  No significant family hx of colon cancer or other GI issues.  Had Pneumovax 23 completed last year, and due for Prevnar 13.  Due for other preventive health testing, including Hep C screen, & dexa scan.  Received flu shot this season.  Last PAP done last year, no new sexual partners.      Review of Systems  Constitutional: Negative for activity change, appetite change, fatigue and unexpected weight change.  HENT: Negative for dental problem, ear pain, sinus pressure and sore throat.   Respiratory: Negative for cough, chest tightness, shortness of breath and wheezing.   Cardiovascular: Negative for chest pain, palpitations and leg swelling.  Gastrointestinal: Negative for abdominal distention, abdominal pain, blood in stool, constipation, diarrhea, nausea and vomiting.  Genitourinary: Negative for difficulty urinating, dysuria, enuresis, frequency, genital sores, pelvic pain, urgency, vaginal bleeding and vaginal discharge.  Neurological: Negative for weakness and light-headedness.       Objective:   Physical Exam  Constitutional: She is  oriented to person, place, and time. She appears well-developed. No distress.  HENT:  Right Ear: External ear normal.  Left Ear: External ear normal.  Mouth/Throat: Oropharynx is clear and moist.  Neck: Normal range of motion. Neck supple. No tracheal deviation present. No thyromegaly present.  Cardiovascular: Normal rate, regular rhythm and normal heart sounds.  Exam reveals no gallop.   No murmur heard. Pulmonary/Chest: Effort normal and breath sounds normal.  Abdominal: Soft. She exhibits no distension. There is no tenderness.  Genitourinary:  Genitourinary Comments: Defers GU exam at this time.   Musculoskeletal: She exhibits no edema.  Lymphadenopathy:    She has no cervical adenopathy.  Neurological: She is alert and oriented to person, place, and time.  Skin: Skin is warm and dry. No rash noted.  Psychiatric: Her behavior is normal.  Calm affect, tearful at times   Breast:  Defers breast exam at this time.        Assessment & Plan:   Problem List Items Addressed This Visit      Other   Depression   Relevant Medications   PARoxetine (PAXIL) 20 MG tablet   ALPRAZolam (XANAX) 0.5 MG tablet    Other Visit Diagnoses    Routine general medical examination at a health care facility    -  Primary   Need for vaccination       Relevant Orders   Pneumococcal conjugate vaccine 13-valent IM (Completed)       Meds ordered this encounter  Medications  . hydrocortisone 2.5 % cream    Sig: Apply topically 2 (two) times daily.  Dispense:  60 g    Refill:  0    Order Specific Question:   Supervising Provider    Answer:   Merlyn AlbertLUKING, WILLIAM S [[id number]]  . hydrochlorothiazide (HYDRODIURIL) 25 MG tablet    Sig: Take 0.5 tablets (12.5 mg total) by mouth daily.    Dispense:  45 tablet    Refill:  1    Order Specific Question:   Supervising Provider    Answer:   Merlyn AlbertLUKING, WILLIAM S [[id number]]  . enalapril (VASOTEC) 20 MG tablet    Sig: Take 1 tablet (20 mg total) by mouth daily.     Dispense:  30 tablet    Refill:  5    Order Specific Question:   Supervising Provider    Answer:   Merlyn AlbertLUKING, WILLIAM S [[id number]]  . PARoxetine (PAXIL) 20 MG tablet    Sig: TAKE ONE AND ONE-HALF TABLET BY MOUTH ONCE DAILY    Dispense:  135 tablet    Refill:  1    Order Specific Question:   Supervising Provider    Answer:   Merlyn AlbertLUKING, WILLIAM S [[id number]]  . ALPRAZolam (XANAX) 0.5 MG tablet    Sig: Take 1 tablet (0.5 mg total) by mouth 2 (two) times daily as needed.    Dispense:  60 tablet    Refill:  5    Order Specific Question:   Supervising Provider    Answer:   Merlyn AlbertLUKING, WILLIAM S [[id number]]   Discussed routine screening and health maintenance in detail with patient.  Agrees to get Prevnar 13 today.  Chooses to defer other tests/exams today due to emotional and financial strain and is willing to revisit them in the near future.  Deferred both vaginal and breast exam at today's visit as well and again educated and agreed to complete in future.    Lab work pending.   Refills for medications provided.  Warning signs reviewed with patient.  Notify office for any changes in mood or behavior or any other questions or concerns.   Return in about 6 months (around 04/21/2017) for recheck.

## 2016-10-23 DIAGNOSIS — Z79899 Other long term (current) drug therapy: Secondary | ICD-10-CM | POA: Diagnosis not present

## 2016-10-23 DIAGNOSIS — I10 Essential (primary) hypertension: Secondary | ICD-10-CM | POA: Diagnosis not present

## 2016-10-24 ENCOUNTER — Ambulatory Visit (HOSPITAL_COMMUNITY)
Admission: RE | Admit: 2016-10-24 | Discharge: 2016-10-24 | Disposition: A | Discharge status: Home / Self Care | Payer: Medicare HMO | Source: Ambulatory Visit | Attending: Family Medicine | Admitting: Family Medicine

## 2016-10-24 ENCOUNTER — Encounter: Payer: Self-pay | Admitting: Family Medicine

## 2016-10-24 DIAGNOSIS — Z1231 Encounter for screening mammogram for malignant neoplasm of breast: Secondary | ICD-10-CM | POA: Diagnosis not present

## 2016-10-24 LAB — BASIC METABOLIC PANEL
BUN/Creatinine Ratio: 19 (ref 12–28)
BUN: 14 mg/dL (ref 8–27)
CALCIUM: 9.4 mg/dL (ref 8.7–10.3)
CO2: 25 mmol/L (ref 18–29)
CREATININE: 0.72 mg/dL (ref 0.57–1.00)
Chloride: 98 mmol/L (ref 96–106)
GFR calc Af Amer: 100 (ref >59)
GFR, EST NON AFRICAN AMERICAN: 87 (ref >59)
Glucose: 96 mg/dL (ref 65–99)
POTASSIUM: 3.9 mmol/L (ref 3.5–5.2)
Sodium: 141 mmol/L (ref 134–144)

## 2016-10-24 LAB — LIPID PANEL
Chol/HDL Ratio: 3.8 (ref 0.0–4.4)
Cholesterol, Total: 179 mg/dL (ref 100–199)
HDL: 47 mg/dL (ref >39)
LDL CALC: 100 — AB (ref 0–99)
Triglycerides: 161 mg/dL — ABNORMAL HIGH (ref 0–149)
VLDL Cholesterol Cal: 32 (ref 5–40)

## 2016-10-24 LAB — HEPATIC FUNCTION PANEL
ALBUMIN: 4.2 g/dL (ref 3.6–4.8)
ALT: 13 IU/L (ref 0–32)
AST: 15 IU/L (ref 0–40)
Alkaline Phosphatase: 71 IU/L (ref 39–117)
Bilirubin Total: 0.3 mg/dL (ref 0.0–1.2)
Bilirubin, Direct: 0.1 mg/dL (ref 0.00–0.40)
TOTAL PROTEIN: 7.1 g/dL (ref 6.0–8.5)

## 2016-11-22 ENCOUNTER — Encounter: Payer: Self-pay | Admitting: Family Medicine

## 2016-11-22 ENCOUNTER — Ambulatory Visit (INDEPENDENT_AMBULATORY_CARE_PROVIDER_SITE_OTHER): Payer: Medicare HMO | Admitting: Family Medicine

## 2016-11-22 VITALS — BP 144/90 | Temp 99.3°F | Ht 63.0 in | Wt 181.0 lb

## 2016-11-22 DIAGNOSIS — J029 Acute pharyngitis, unspecified: Secondary | ICD-10-CM | POA: Diagnosis not present

## 2016-11-22 DIAGNOSIS — J329 Chronic sinusitis, unspecified: Secondary | ICD-10-CM | POA: Diagnosis not present

## 2016-11-22 LAB — POCT RAPID STREP A (OFFICE): Rapid Strep A Screen: NEGATIVE

## 2016-11-22 MED ORDER — AMOXICILLIN-POT CLAVULANATE 875-125 MG PO TABS: 1.0000 | ORAL_TABLET | Freq: Two times a day (BID) | ORAL | 0 refills | Status: DC

## 2016-11-22 NOTE — Progress Notes (Signed)
   Subjective:    Patient ID: Lonna Cobbatricia J Larose, female    DOB: 07-Aug-1951, 66 y.o.   MRN: [id number]  Sore Throat   This is a new problem. Episode onset: 2 weeks. Maximum temperature: low grade fever. Associated symptoms include coughing and headaches. Treatments tried: salt water, allergy meds, robitussin     hit hard initially couple weeks ago  Felt bad achey, cough, some headache body aches  Then improved,  Returned this past week, cough and cong headache frontal, off and on, worse with cough, achey at times sharp at times   Review of Systems  Respiratory: Positive for cough.   Neurological: Positive for headaches.       Objective:   Physical Exam  Alert, mild malaise. Hydration good Vitals stable. frontal/ maxillary tenderness evident positive nasal congestion. pharynx normal neck supple  lungs clear/no crackles or wheezes. heart regular in rhythm       Assessment & Plan:  Impression rhinosinusitis likely post viral,spec post flu,  discussed with patient. plan antibiotics prescribed. Questions answered. Symptomatic care discussed. warning signs discussed. WSL

## 2017-01-09 ENCOUNTER — Other Ambulatory Visit: Payer: Self-pay

## 2017-01-09 MED ORDER — HYDROCORTISONE 2.5 % EX CREA: TOPICAL_CREAM | Freq: Two times a day (BID) | CUTANEOUS | 0 refills | Status: DC

## 2017-01-09 NOTE — Progress Notes (Signed)
Prescription sent electronically to pharmacy. 

## 2017-01-09 NOTE — Progress Notes (Signed)
Ok

## 2017-02-25 ENCOUNTER — Telehealth: Payer: Self-pay | Admitting: Nurse Practitioner

## 2017-02-25 NOTE — Telephone Encounter (Signed)
Left message return call 02/25/2017 

## 2017-02-25 NOTE — Telephone Encounter (Signed)
Patient has an appointment on 04/29/17 with Melissa Wiley.  She wants to know if she is due for labwork before or if she should wait until the visit?

## 2017-02-25 NOTE — Telephone Encounter (Signed)
No labs needed at this point. Will discuss more at visit.

## 2017-02-26 NOTE — Telephone Encounter (Signed)
Spoke with patient and informed her per Nathaneil Canaryarolyn Hoskins,NP- No labs needed at this point. Will discuss more visit at visit. Patient verbalized understanding.

## 2017-04-22 ENCOUNTER — Other Ambulatory Visit: Payer: Self-pay | Admitting: *Deleted

## 2017-04-24 MED ORDER — ENALAPRIL MALEATE 20 MG PO TABS: 20.0000 mg | ORAL_TABLET | Freq: Every day | ORAL | 0 refills | Status: DC

## 2017-04-24 NOTE — Telephone Encounter (Signed)
sched o v, 30 d worth

## 2017-04-29 ENCOUNTER — Ambulatory Visit (INDEPENDENT_AMBULATORY_CARE_PROVIDER_SITE_OTHER): Payer: Medicare HMO | Admitting: Nurse Practitioner

## 2017-04-29 ENCOUNTER — Encounter: Payer: Self-pay | Admitting: Nurse Practitioner

## 2017-04-29 VITALS — BP 118/76 | Ht 63.0 in | Wt 178.0 lb

## 2017-04-29 DIAGNOSIS — I10 Essential (primary) hypertension: Secondary | ICD-10-CM

## 2017-04-29 DIAGNOSIS — Z634 Disappearance and death of family member: Secondary | ICD-10-CM

## 2017-04-29 DIAGNOSIS — R69 Illness, unspecified: Secondary | ICD-10-CM | POA: Diagnosis not present

## 2017-04-29 MED ORDER — PHENTERMINE HCL 37.5 MG PO TABS: 37.5000 mg | ORAL_TABLET | Freq: Every day | ORAL | 0 refills | Status: DC

## 2017-04-30 ENCOUNTER — Encounter: Payer: Self-pay | Admitting: Nurse Practitioner

## 2017-04-30 NOTE — Progress Notes (Signed)
Subjective:  Patient presents for recheck on HTN. Compliant with medication. Concerned about weight gain especially around the waist area. No CP/ischemic type pain or SOB. Limited exercise. Has started sitting with elderly patients which she enjoys. Still dealing with the loss of her daughter a few months ago. Overall doing well.    Objective:   BP 118/76   Ht 5\' 3"  (1.6 m)   Wt 178 lb (80.7 kg)   BMI 31.53 kg/m  NAD. Alert, oriented. Lungs clear. Heart RRR. Carotids no bruits or thrills. LE: no edema. Waist circumfernce 39.5 in.  Assessment:   Problem List Items Addressed This Visit      Cardiovascular and Mediastinum   Essential hypertension, benign - Primary    Other Visit Diagnoses    Morbid obesity (HCC)       Relevant Medications   phentermine (ADIPEX-P) 37.5 MG tablet   Bereavement           Plan:   Meds ordered this encounter  Medications  . phentermine (ADIPEX-P) 37.5 MG tablet    Sig: Take 1 tablet (37.5 mg total) by mouth daily before breakfast.    Dispense:  30 tablet    Refill:  0    Order Specific Question:   Supervising Provider    Answer:   Riccardo Dubin   Wishes to try Phentermine. Discussed potential adverse effects. DC med and call if any problems. Increase activity. Limit sugar and simple carbs in diet.  Return in about 1 month (around 05/30/2017) for recheck.

## 2017-05-31 ENCOUNTER — Ambulatory Visit: Payer: Medicare HMO | Admitting: Nurse Practitioner

## 2017-06-03 ENCOUNTER — Other Ambulatory Visit: Payer: Self-pay | Admitting: Nurse Practitioner

## 2017-06-14 ENCOUNTER — Ambulatory Visit: Payer: Medicare HMO | Admitting: Nurse Practitioner

## 2017-07-05 ENCOUNTER — Other Ambulatory Visit: Payer: Self-pay | Admitting: Nurse Practitioner

## 2017-07-05 ENCOUNTER — Other Ambulatory Visit: Payer: Self-pay | Admitting: Family Medicine

## 2017-07-08 DIAGNOSIS — R69 Illness, unspecified: Secondary | ICD-10-CM | POA: Diagnosis not present

## 2017-07-16 ENCOUNTER — Encounter: Payer: Self-pay | Admitting: Family Medicine

## 2017-07-16 ENCOUNTER — Ambulatory Visit: Payer: Medicare HMO | Admitting: Family Medicine

## 2017-07-16 VITALS — BP 150/90 | HR 94 | Temp 100.5°F | Ht 63.0 in | Wt 176.1 lb

## 2017-07-16 DIAGNOSIS — R509 Fever, unspecified: Secondary | ICD-10-CM

## 2017-07-16 DIAGNOSIS — J019 Acute sinusitis, unspecified: Secondary | ICD-10-CM

## 2017-07-16 DIAGNOSIS — J029 Acute pharyngitis, unspecified: Secondary | ICD-10-CM | POA: Diagnosis not present

## 2017-07-16 LAB — POCT RAPID STREP A (OFFICE): RAPID STREP A SCREEN: NEGATIVE

## 2017-07-16 MED ORDER — AMOXICILLIN-POT CLAVULANATE 875-125 MG PO TABS: 1.0000 | ORAL_TABLET | Freq: Two times a day (BID) | ORAL | 0 refills | Status: DC

## 2017-07-16 NOTE — Progress Notes (Signed)
   Subjective:    Patient ID: Melissa Wiley, female    DOB: 31-Mar-1951, 66 y.o.   MRN: [id number]  HPI Patient is here today complaining of low grade fever,cough,scratchy throat,headache,body aches for a week now. Has tried zycam,cough drops.Has had some increased pulse rates in the last few days.Had flu injection two weeks ago. Results for orders placed or performed in visit on 07/16/17  POCT rapid strep A  Result Value Ref Range   Rapid Strep A Screen Negative Negative     Review of Systems  Constitutional: Negative for activity change and fever.  HENT: Positive for congestion and rhinorrhea. Negative for ear pain.   Eyes: Negative for discharge.  Respiratory: Positive for cough. Negative for shortness of breath and wheezing.   Cardiovascular: Negative for chest pain.       Objective:   Physical Exam  Constitutional: She appears well-developed.  HENT:  Head: Normocephalic.  Right Ear: External ear normal.  Left Ear: External ear normal.  Nose: Nose normal.  Mouth/Throat: Oropharynx is clear and moist. No oropharyngeal exudate.  Eyes: Right eye exhibits no discharge. Left eye exhibits no discharge.  Neck: Neck supple. No tracheal deviation present.  Cardiovascular: Normal rate and normal heart sounds.  No murmur heard. Pulmonary/Chest: Effort normal and breath sounds normal. She has no wheezes. She has no rales.  Lymphadenopathy:    She has no cervical adenopathy.  Skin: Skin is warm and dry.  Nursing note and vitals reviewed.  15 minutes spent with patient evaluating her discussing with her greater than half in discussion       Assessment & Plan:  Patient was seen today for upper respiratory illness. It is felt that the patient is dealing with sinusitis. Antibiotics were prescribed today. Importance of compliance with medication was discussed. Symptoms should gradually resolve over the course of the next several days. If high fevers, progressive illness, difficulty  breathing, worsening condition or failure for symptoms to improve over the next several days then the patient is to follow-up. If any emergent conditions the patient is to follow-up in the emergency department otherwise to follow-up in the office.   Patient should get better with antibiotics patient not toxic lab work x-rays not indicated warning signs were discussed in detail

## 2017-07-17 LAB — STREP A DNA PROBE: STREP GP A DIRECT, DNA PROBE: NEGATIVE

## 2017-07-29 ENCOUNTER — Encounter: Payer: Self-pay | Admitting: Nurse Practitioner

## 2017-07-29 ENCOUNTER — Ambulatory Visit: Payer: Medicare HMO | Admitting: Nurse Practitioner

## 2017-07-29 VITALS — BP 130/80 | Temp 98.9°F | Ht 63.0 in | Wt 179.0 lb

## 2017-07-29 DIAGNOSIS — R309 Painful micturition, unspecified: Secondary | ICD-10-CM

## 2017-07-29 DIAGNOSIS — B373 Candidiasis of vulva and vagina: Secondary | ICD-10-CM | POA: Diagnosis not present

## 2017-07-29 DIAGNOSIS — B3731 Acute candidiasis of vulva and vagina: Secondary | ICD-10-CM

## 2017-07-29 DIAGNOSIS — R350 Frequency of micturition: Secondary | ICD-10-CM

## 2017-07-29 LAB — POCT UA - MICROSCOPIC ONLY
Bacteria, U Microscopic: NEGATIVE
EPITHELIAL CELLS, URINE PER MICROSCOPY: NEGATIVE

## 2017-07-29 LAB — POCT GLUCOSE (DEVICE FOR HOME USE): POC GLUCOSE: 88 mg/dL (ref 70–99)

## 2017-07-29 LAB — POCT URINALYSIS DIPSTICK
PH UA: 5 (ref 5.0–8.0)
RBC UA: NEGATIVE
Spec Grav, UA: 1.025 (ref 1.010–1.025)

## 2017-07-29 MED ORDER — TERCONAZOLE 0.4 % VA CREA: TOPICAL_CREAM | VAGINAL | 0 refills | Status: DC

## 2017-07-29 MED ORDER — FLUCONAZOLE 100 MG PO TABS: ORAL_TABLET | ORAL | 0 refills | Status: DC

## 2017-07-29 NOTE — Patient Instructions (Signed)

## 2017-07-30 ENCOUNTER — Other Ambulatory Visit: Payer: Self-pay | Admitting: *Deleted

## 2017-07-30 ENCOUNTER — Telehealth: Payer: Self-pay | Admitting: *Deleted

## 2017-07-30 ENCOUNTER — Other Ambulatory Visit: Payer: Self-pay | Admitting: Nurse Practitioner

## 2017-07-30 DIAGNOSIS — R309 Painful micturition, unspecified: Secondary | ICD-10-CM

## 2017-07-30 DIAGNOSIS — R300 Dysuria: Secondary | ICD-10-CM

## 2017-07-30 LAB — POCT URINALYSIS DIPSTICK
Protein, UA: 30
pH, UA: 5 (ref 5.0–8.0)

## 2017-07-30 NOTE — Telephone Encounter (Signed)
Pt seen yesterday by Melissa Wiley. Could not get a urine at visit. Dropped one off this morning. Dipstick is in epic and urine ready to be looked at under microscope.  Results for orders placed or performed in visit on 07/29/17  POCT urinalysis dipstick  Result Value Ref Range   Color, UA yellow    Clarity, UA Clear    Glucose, UA     Bilirubin, UA     Ketones, UA     Spec Grav, UA 1.025 1.010 - 1.025   Blood, UA Negative    pH, UA 5.0 5.0 - 8.0   Protein, UA     Urobilinogen, UA  0.2 or 1.0 E.U./dL   Nitrite, UA     Leukocytes, UA Large (3+) (A) Negative  POCT Glucose (Device for Home Use)  Result Value Ref Range   Glucose Fasting, POC  70 - 99 mg/dL   POC Glucose 88 70 - 99 mg/dl  POCT UA - Microscopic Only  Result Value Ref Range   WBC, Ur, HPF, POC 1-5    RBC, urine, microscopic rare    Bacteria, U Microscopic neg    Mucus, UA     Epithelial cells, urine per micros neg    Crystals, Ur, HPF, POC     Casts, Ur, LPF, POC     Yeast, UA occas

## 2017-07-31 ENCOUNTER — Encounter: Payer: Self-pay | Admitting: Nurse Practitioner

## 2017-07-31 NOTE — Progress Notes (Signed)
Subjective: Presents for complaints of pain in the genital area after completing Augmentin on 11/13.  Has steadily gotten worse.  Now having severe pain externally with itching.  Some discharge.  Swelling in the genital area.  No new sexual partners.  No abdominal or pelvic pain.  No fever.  No nausea or vomiting.  No back or flank pain.  Some dysuria but unsure if it is external.  Some urinary frequency.  Objective:   BP 130/80   Temp 98.9 F (37.2 C) (Oral)   Ht 5\' 3"  (1.6 m)   Wt 179 lb (81.2 kg)   BMI 31.71 kg/m  NAD.  Alert, oriented.  Lungs clear.  Heart regular rate and rhythm.  No CVA or flank tenderness.  Abdomen soft nondistended nontender.  External GU vulvar area extremely swollen and tender in general with superficial maceration and white tissue noted.  Unable to do a vaginal exam due to extreme tenderness.  No lesions or nodules noted. Results for orders placed or performed in visit on 07/29/17  POCT urinalysis dipstick  Result Value Ref Range   Color, UA yellow    Clarity, UA Clear    Glucose, UA     Bilirubin, UA     Ketones, UA     Spec Grav, UA 1.025 1.010 - 1.025   Blood, UA Negative    pH, UA 5.0 5.0 - 8.0   Protein, UA     Urobilinogen, UA  0.2 or 1.0 E.U./dL   Nitrite, UA     Leukocytes, UA Large (3+) (A) Negative  POCT Glucose (Device for Home Use)  Result Value Ref Range   Glucose Fasting, POC  70 - 99 mg/dL   POC Glucose 88 70 - 99 mg/dl  POCT UA - Microscopic Only  Result Value Ref Range   WBC, Ur, HPF, POC 1-5    RBC, urine, microscopic rare    Bacteria, U Microscopic neg    Mucus, UA     Epithelial cells, urine per micros neg    Crystals, Ur, HPF, POC     Casts, Ur, LPF, POC     Yeast, UA occas      Assessment:  Vulvar candidiasis  Painful urination - Plan: POCT urinalysis dipstick, POCT UA - Microscopic Only  Urinary frequency - Plan: POCT Glucose (Device for Home Use), POCT UA - Microscopic Only    Plan:   Meds ordered this encounter   Medications  . fluconazole (DIFLUCAN) 100 MG tablet    Sig: 2 po today then one po qd x 14 d    Dispense:  16 tablet    Refill:  0    Order Specific Question:   Supervising Provider    Answer:   Merlyn AlbertLUKING, WILLIAM S [[id number]]  . terconazole (TERAZOL 7) 0.4 % vaginal cream    Sig: Apply internally and externally at night x 7 days    Dispense:  45 g    Refill:  0    Order Specific Question:   Supervising Provider    Answer:   Riccardo DubinLUKING, WILLIAM S [[id number]]   May also use some hydrocortisone cream externally for the next 2-3 days if needed.  Warning signs reviewed.  Unable to give an adequate sample for urine culture.  Patient to bring a sample back tomorrow.  Call back in 48-72 hours if no improvement, sooner if worse.  Explained that symptoms are most likely related to illness and use of antibiotics.  Note her blood sugar is normal.

## 2017-07-31 NOTE — Telephone Encounter (Signed)
Urine culture sent.

## 2017-08-01 ENCOUNTER — Telehealth: Payer: Self-pay | Admitting: Family Medicine

## 2017-08-01 LAB — URINE CULTURE

## 2017-08-01 NOTE — Telephone Encounter (Signed)
Spoke with patient and informed her that urine culture was negative. Please see lab results.

## 2017-08-01 NOTE — Telephone Encounter (Signed)
Pt is requesting a call when urine culture results are received.

## 2017-08-01 NOTE — Telephone Encounter (Signed)
Culture just came in. See lab results.

## 2017-08-02 ENCOUNTER — Other Ambulatory Visit: Payer: Self-pay | Admitting: Family Medicine

## 2017-09-16 ENCOUNTER — Other Ambulatory Visit: Payer: Self-pay | Admitting: Family Medicine

## 2017-10-08 ENCOUNTER — Other Ambulatory Visit: Payer: Self-pay | Admitting: Nurse Practitioner

## 2017-10-08 DIAGNOSIS — Z1231 Encounter for screening mammogram for malignant neoplasm of breast: Secondary | ICD-10-CM

## 2017-10-30 ENCOUNTER — Encounter (HOSPITAL_COMMUNITY): Payer: Self-pay

## 2017-10-30 ENCOUNTER — Ambulatory Visit (HOSPITAL_COMMUNITY)
Admission: RE | Admit: 2017-10-30 | Discharge: 2017-10-30 | Disposition: A | Discharge status: Home / Self Care | Payer: Medicare HMO | Source: Ambulatory Visit | Attending: Nurse Practitioner | Admitting: Nurse Practitioner

## 2017-10-30 DIAGNOSIS — Z1231 Encounter for screening mammogram for malignant neoplasm of breast: Secondary | ICD-10-CM | POA: Diagnosis not present

## 2017-11-29 ENCOUNTER — Ambulatory Visit (INDEPENDENT_AMBULATORY_CARE_PROVIDER_SITE_OTHER): Payer: Medicare HMO | Admitting: Nurse Practitioner

## 2017-11-29 ENCOUNTER — Encounter: Payer: Self-pay | Admitting: Nurse Practitioner

## 2017-11-29 VITALS — BP 130/84 | Ht 63.0 in | Wt 182.4 lb

## 2017-11-29 DIAGNOSIS — F430 Acute stress reaction: Secondary | ICD-10-CM

## 2017-11-29 DIAGNOSIS — R079 Chest pain, unspecified: Secondary | ICD-10-CM

## 2017-11-29 DIAGNOSIS — I10 Essential (primary) hypertension: Secondary | ICD-10-CM | POA: Diagnosis not present

## 2017-11-29 DIAGNOSIS — F411 Generalized anxiety disorder: Secondary | ICD-10-CM

## 2017-11-29 DIAGNOSIS — R69 Illness, unspecified: Secondary | ICD-10-CM | POA: Diagnosis not present

## 2017-11-29 DIAGNOSIS — R5383 Other fatigue: Secondary | ICD-10-CM | POA: Diagnosis not present

## 2017-11-29 NOTE — Patient Instructions (Signed)
Increase Paxil to 1 1/2 tabs (30 mg) per day Take BP 3-4 times per week and call back in 2 weeks with BP results

## 2017-11-30 ENCOUNTER — Encounter: Payer: Self-pay | Admitting: Nurse Practitioner

## 2017-11-30 DIAGNOSIS — I10 Essential (primary) hypertension: Secondary | ICD-10-CM | POA: Diagnosis not present

## 2017-11-30 DIAGNOSIS — R5383 Other fatigue: Secondary | ICD-10-CM | POA: Diagnosis not present

## 2017-11-30 NOTE — Progress Notes (Signed)
Subjective:  Presents for c/o elevation in her BP for the past few days. Checks her BP at home: running 160-180/80 with pulse 93. Has not been on Phentermine in months. Stopped due to increased BP. Has been under increased stress. Her grandson, his girlfriend and their 2 small children are living with her and will be there for the next few months. Tried Xanax and rested last night but minimal improvement in BP. Adherent to medications. No SOB. Experienced an episode of dull pain in the left neck area followed by a heaviness in her left arm last night. No edema. No visual changes. No difficulty speaking or swallowing. No numbness or weakness of the face, arms or legs.  FMH: sister with heart disease.   Objective:   BP 130/84   Ht 5\' 3"  (1.6 m)   Wt 182 lb 6.4 oz (82.7 kg)   BMI 32.31 kg/m  NAD. Alert, oriented. Lungs clear. Heart RRR. Carotids: no bruits or thrills. LE: no edema. EKG: NSR. No previous readings for comparison.,    Assessment:   Problem List Items Addressed This Visit      Cardiovascular and Mediastinum   Essential hypertension, benign - Primary   Relevant Orders   Basic metabolic panel   Lipid panel   Hepatic function panel    Other Visit Diagnoses    Chest pain, unspecified type       Relevant Orders   PR ELECTROCARDIOGRAM, COMPLETE   Fatigue, unspecified type       Relevant Orders   CBC with Differential/Platelet   TSH   Anxiety as acute reaction to exceptional stress           Plan:  Discussed options. Increase Paxil to 1 1/2 tabs (30 mg) per day Take BP 3-4 times per week and call back in 2 weeks with BP results. Refer to cardiology for evaluation. Warning signs reviewed. Call or go to ED sooner if any problems. Lengthy discussion regarding stress reduction.  25 minutes was spent with the patient.  This statement verifies that 25 minutes was indeed spent with the patient. Greater than half the time was spent in discussion, counseling and answering questions   regarding the issues that the patient came in for today as reflected in the diagnosis (s) please refer to documentation for further details.

## 2017-11-30 NOTE — Addendum Note (Signed)
Addended by: Campbell RichesHOSKINS, Anna Livers C on: 11/30/2017 07:30 PM   Modules accepted: Orders

## 2017-12-01 LAB — LIPID PANEL
CHOLESTEROL TOTAL: 164 mg/dL (ref 100–199)
Chol/HDL Ratio: 3.6 ratio (ref 0.0–4.4)
HDL: 45 mg/dL (ref >39)
LDL Calculated: 88 mg/dL (ref 0–99)
TRIGLYCERIDES: 157 mg/dL — AB (ref 0–149)
VLDL CHOLESTEROL CAL: 31 mg/dL (ref 5–40)

## 2017-12-01 LAB — CBC WITH DIFFERENTIAL/PLATELET
Basophils Absolute: 0 10*3/uL (ref 0.0–0.2)
Basos: 0 %
EOS (ABSOLUTE): 0.2 10*3/uL (ref 0.0–0.4)
EOS: 1 %
HEMATOCRIT: 34.9 % (ref 34.0–46.6)
HEMOGLOBIN: 10.4 g/dL — AB (ref 11.1–15.9)
Immature Grans (Abs): 0 10*3/uL (ref 0.0–0.1)
Immature Granulocytes: 0 %
LYMPHS ABS: 2 10*3/uL (ref 0.7–3.1)
Lymphs: 17 %
MCH: 24.5 pg — AB (ref 26.6–33.0)
MCHC: 29.8 g/dL — AB (ref 31.5–35.7)
MCV: 82 fL (ref 79–97)
MONOCYTES: 5 %
MONOS ABS: 0.5 10*3/uL (ref 0.1–0.9)
NEUTROS ABS: 9 10*3/uL — AB (ref 1.4–7.0)
Neutrophils: 77 %
Platelets: 281 10*3/uL (ref 150–379)
RBC: 4.25 x10E6/uL (ref 3.77–5.28)
RDW: 14 % (ref 12.3–15.4)
WBC: 11.7 10*3/uL — ABNORMAL HIGH (ref 3.4–10.8)

## 2017-12-01 LAB — BASIC METABOLIC PANEL
BUN / CREAT RATIO: 16 (ref 12–28)
BUN: 11 mg/dL (ref 8–27)
CHLORIDE: 99 mmol/L (ref 96–106)
CO2: 28 mmol/L (ref 20–29)
CREATININE: 0.68 mg/dL (ref 0.57–1.00)
Calcium: 9.2 mg/dL (ref 8.7–10.3)
GFR calc Af Amer: 103 mL/min/{1.73_m2} (ref >59)
GFR, EST NON AFRICAN AMERICAN: 90 mL/min/{1.73_m2} (ref >59)
Glucose: 97 mg/dL (ref 65–99)
POTASSIUM: 3.8 mmol/L (ref 3.5–5.2)
Sodium: 142 mmol/L (ref 134–144)

## 2017-12-01 LAB — HEPATIC FUNCTION PANEL
ALT: 10 IU/L (ref 0–32)
AST: 15 IU/L (ref 0–40)
Albumin: 4.3 g/dL (ref 3.6–4.8)
Alkaline Phosphatase: 72 IU/L (ref 39–117)
BILIRUBIN, DIRECT: 0.08 mg/dL (ref 0.00–0.40)
Bilirubin Total: 0.3 mg/dL (ref 0.0–1.2)
Total Protein: 7 g/dL (ref 6.0–8.5)

## 2017-12-01 LAB — TSH: TSH: 1.57 u[IU]/mL (ref 0.450–4.500)

## 2017-12-04 ENCOUNTER — Encounter: Payer: Self-pay | Admitting: Family Medicine

## 2017-12-04 ENCOUNTER — Other Ambulatory Visit: Payer: Self-pay | Admitting: Nurse Practitioner

## 2017-12-06 ENCOUNTER — Telehealth: Payer: Self-pay | Admitting: Nurse Practitioner

## 2017-12-06 NOTE — Telephone Encounter (Signed)
Much better! The Paxil seems to be helping her stress and anxiety. Send message next week with BP results as planned.

## 2017-12-06 NOTE — Telephone Encounter (Signed)
Pt called to let carolyn know what her bp readings were  March 31   Morning 164/85 pulse 87  Night  160/93 pulse 107 Apr 1          Morning 132/78 pulse 108  Night  139/89 pulse 100 Apr 3         Afternoon 135/83 pulse 111 Apr 5         Afternoon 137/85 pulse 83  Pt will call the end of next week with those readings.

## 2017-12-09 ENCOUNTER — Telehealth: Payer: Self-pay | Admitting: Family Medicine

## 2017-12-09 NOTE — Telephone Encounter (Signed)
I called and left a message to r/c. 

## 2017-12-09 NOTE — Telephone Encounter (Signed)
Patient notified and verbalized understanding. 

## 2017-12-09 NOTE — Telephone Encounter (Signed)
addressed

## 2017-12-09 NOTE — Telephone Encounter (Signed)
Pt called to return Crystal's call.

## 2017-12-09 NOTE — Telephone Encounter (Signed)
Pt called back and left a vm. I tried calling no answer.

## 2017-12-16 ENCOUNTER — Other Ambulatory Visit: Payer: Self-pay | Admitting: *Deleted

## 2017-12-16 DIAGNOSIS — D649 Anemia, unspecified: Secondary | ICD-10-CM

## 2017-12-17 ENCOUNTER — Telehealth: Payer: Self-pay | Admitting: *Deleted

## 2017-12-17 ENCOUNTER — Other Ambulatory Visit: Payer: Self-pay | Admitting: *Deleted

## 2017-12-17 DIAGNOSIS — D649 Anemia, unspecified: Secondary | ICD-10-CM | POA: Diagnosis not present

## 2017-12-17 NOTE — Telephone Encounter (Signed)
Pt states she has thought about it and she is willing to do the colonscopy. She wants dr fields. Referral put in.

## 2017-12-18 LAB — IRON,TIBC AND FERRITIN PANEL
FERRITIN: 89 ng/mL (ref 15–150)
Iron Saturation: 13 % — ABNORMAL LOW (ref 15–55)
Iron: 38 ug/dL (ref 27–139)
Total Iron Binding Capacity: 289 ug/dL (ref 250–450)
UIBC: 251 ug/dL (ref 118–369)

## 2017-12-18 NOTE — Telephone Encounter (Signed)
Noted  

## 2017-12-19 ENCOUNTER — Encounter: Payer: Self-pay | Admitting: Family Medicine

## 2017-12-23 ENCOUNTER — Encounter: Payer: Self-pay | Admitting: Gastroenterology

## 2018-01-01 ENCOUNTER — Ambulatory Visit: Payer: Medicare HMO | Admitting: Gastroenterology

## 2018-01-01 ENCOUNTER — Encounter: Payer: Self-pay | Admitting: Gastroenterology

## 2018-01-01 DIAGNOSIS — D649 Anemia, unspecified: Secondary | ICD-10-CM

## 2018-01-01 NOTE — Patient Instructions (Addendum)
COMPLETE COLONOSCOPY AND POSSIBLY THE UPPER ENDOSCOPY TO LOOK FOR SOURCE OF BLOOD LOSS IN GI TRACT. THE PROCEDURE WILL BE IN 2-3 WEEKS. YOU MAY BRING THE ENEMA TO ADMINISTER IN THE PREOP AREA.  HOLD HCTZ ON DAY BEFORE AND DAY OF COLONOSCOPY.  FOLLOW A CLEAR LIQUID DIET ON DAY BEFORE  COLONOSCOPY.   FOLLOW UP IN 4 MOS.

## 2018-01-01 NOTE — Progress Notes (Signed)
Subjective:    Patient ID: Melissa Wiley, female    DOB: 1951-03-01, 67 y.o.   MRN: [id number]  Merlyn Albert, MD  HPI Here to have a colonoscopy. JUST HAD A SPECIAL NEEDS CHILD (YOUNGEST TO PASS AT AGE 47). BMs: 1-2 CUPS OF COFFEE AND THEN GOES AFTER THAT, REGULAR.   APPETITE: DOESN'T EAT REGULAR MEALS, DOESN'T EAT RED MEAT JUST CHICKEN REALLY. DUE TO BUDGET DOESN'T EAT LIKE SHE IS SUPPOSE TO. LOVES CHEERIOS BUT NOT A LOT OF MEAT. WEIGHT LOSS: LOST 9 LBS OVER PAST 3 MOS. LAST 3 MOS UNDER STRESS: GRANDSON AND 2 BABIES. ABOUT MIDDLE ANXIETY LEVEL. USES XANAX TO SLEEP(EVERY NIGHT). IF DOESN'T TAKE IT SHE CAN'T SLEEP. BEEN ON PAXIL FOR ~5 YRS.   PT DENIES FEVER, CHILLS, HEMATOCHEZIA, HEMATEMESIS, nausea, vomiting, melena, diarrhea, CHEST PAIN, SHORTNESS OF BREATH, CHANGE IN BOWEL IN HABITS, constipation, abdominal pain, problems swallowing, problems with sedation, OR heartburn or indigestion.  Past Medical History:  Diagnosis Date  . Anxiety   . Arthritis   . 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 Hx of migraines   . Hypertension   . Insomnia   . Stress    Past Surgical History:  Procedure Laterality Date  . CESAREAN SECTION  1985   No Known Allergies  Current Outpatient Medications  Medication Sig    . ALPRAZolam (XANAX) 0.5 MG tablet TAKE 1 TABLET BY MOUTH TWICE DAILY AS NEEDED    . enalapril (VASOTEC) 20 MG tablet TAKE 1 TABLET BY MOUTH ONCE DAILY    . hydrochlorothiazide (HYDRODIURIL) 25 MG tablet TAKE ONE-HALF TABLET BY MOUTH ONCE DAILY    . PARoxetine (PAXIL) 20 MG tablet TAKE 1 & 1/2 (ONE & ONE-HALF) TABLETS BY MOUTH ONCE DAILY    . fluticasone (FLONASE) 50 MCG/ACT nasal spray Place 2 sprays into both nostrils daily.     Family History  Problem Relation Age of Onset  . Hypertension Mother   . Hypertension Sister   . Diabetes Sister   . Heart disease Sister   . Colon cancer Neg Hx   . Colon polyps Neg Hx     Social History   Socioeconomic History  .  Marital status: Married    Spouse name: Not on file  . Number of children: Not on file  . Years of education: Not on file  . Highest education level: Not on file  Occupational History  . Not on file  Social Needs  . Financial resource strain: Not on file  . Food insecurity:    Worry: Not on file    Inability: Not on file  . Transportation needs:    Medical: Not on file    Non-medical: Not on file  Tobacco Use  . Smoking status: Never Smoker  . Smokeless tobacco: Never Used  Substance and Sexual Activity  . Alcohol use: Not on file  . Drug use: Not on file  . Sexual activity: Not on file  Lifestyle  . Physical activity:    Days per week: Not on file    Minutes per session: Not on file  . Stress: Not on file  Relationships  . Social connections:    Talks on phone: Not on file    Gets together: Not on file    Attends religious service: Not on file    Active member of club or organization: Not on file    Attends meetings of clubs or organizations: Not on file    Relationship status:  Not on file  Other Topics Concern  . Not on file  Social History Narrative   SPECIAL NEEDS CHILD PASSED IN 2019.   Review of Systems PER HPI OTHERWISE ALL SYSTEMS ARE NEGATIVE. LIKES BABY RIBS.    Objective:   Physical Exam  Constitutional: She is oriented to person, place, and time. She appears well-developed and well-nourished. No distress.  HENT:  Head: Normocephalic and atraumatic.  Mouth/Throat: Oropharynx is clear and moist. No oropharyngeal exudate.  Eyes: Pupils are equal, round, and reactive to light. No scleral icterus.  Neck: Normal range of motion. Neck supple.  Cardiovascular: Normal rate, regular rhythm and normal heart sounds.  Pulmonary/Chest: Effort normal and breath sounds normal. No respiratory distress.  Abdominal: Soft. Bowel sounds are normal. She exhibits no distension. There is no tenderness.  Musculoskeletal: She exhibits no edema.  Lymphadenopathy:    She has  no cervical adenopathy.  Neurological: She is alert and oriented to person, place, and time.  NO FOCAL DEFICITS  Psychiatric:  SLIGHTLY ANXIOUS MOOD, NL AFFECT  Vitals reviewed.     Assessment & Plan:

## 2018-01-01 NOTE — Assessment & Plan Note (Signed)
NO BRBPR OR MELENA.  DIFFERENTIAL DIAGNOSIS INCLUDES: COLON POLYPS, AVMs, H PYLORI GASTRITIS, CAMERON'S EROSIONS,  ATROPHIC GASTRITIS, CELIAC DISEASE, OR LESS LIKELY COLON CANCER.  COMPLETE COLONOSCOPY AND POSSIBLY THE UPPER ENDOSCOPY TO LOOK FOR SOURCE OF BLOOD LOSS IN GI TRACT. THE PROCEDURE WILL BE IN 2-3 WEEKS. YOU MAY BRING THE ENEMA TO ADMINISTER IN THE PREOP AREA. DISCUSSED PROCEDURE, BENEFITS, & RISKS: < 1% chance of medication reaction, bleeding, perforation, or rupture of spleen/liver. HOLD HCTZ ON DAY BEFORE AND DAY OF COLONOSCOPY. FOLLOW A CLEAR LIQUID DIET ON DAY BEFORE  COLONOSCOPY.  FOLLOW UP IN 4 MOS.

## 2018-01-02 ENCOUNTER — Telehealth: Payer: Self-pay

## 2018-01-02 ENCOUNTER — Other Ambulatory Visit: Payer: Self-pay

## 2018-01-02 DIAGNOSIS — D649 Anemia, unspecified: Secondary | ICD-10-CM

## 2018-01-02 MED ORDER — NA SULFATE-K SULFATE-MG SULF 17.5-3.13-1.6 GM/177ML PO SOLN: 1.0000 | ORAL | 0 refills | Status: DC

## 2018-01-02 NOTE — Progress Notes (Signed)
cc'ed to pcp °

## 2018-01-02 NOTE — Telephone Encounter (Signed)
Called pt, TCS/EGD w/Propofol w/SLF scheduled for 02/03/18 at 7:30am. Rx for prep sent to pharmacy. Orders entered. Will mail instructions after pre-op appt is scheduled.

## 2018-01-02 NOTE — Progress Notes (Signed)
ON RECALL  °

## 2018-01-02 NOTE — Telephone Encounter (Signed)
Called and informed pt of pre-op appt 01/28/18 at 12:45pm. Letter mailed with procedure instructions.

## 2018-01-07 NOTE — Progress Notes (Signed)
Cardiology Office Note  Date: 01/10/2018   ID: Melissa Wiley, [place] [birth year], MRN [id number]  PCP: Mikey Kirschner, MD  Consulting Cardiologist: Rozann Lesches, MD   Chief Complaint  Patient presents with  . Chest Pain    History of Present Illness: Melissa Wiley is a 67 y.o. female referred for cardiology consultation by Ms. Hoskins NP for the evaluation of chest pain.  She is here today with her daughter.  She tells me that over a period of about 3 months while her grandson was living with her in her home, she was having a lot of emotional stress, sleeping poorly, and feeling anxious with episodes of left-sided chest discomfort.  Symptoms are not specifically exertional.  She does not recall any unusual shortness of breath, no palpitations or syncope.  Once her grandson moved out of the house, symptoms completely resolved.  She states that she is back to baseline.  Cardiac risk factors include hypertension, some family history and her sister.  She has had good lipids, does not smoke cigarettes.  Baseline ECG showed normal sinus rhythm at office visit with PCP.  She has never undergone any ischemic testing.  Past Medical History:  Diagnosis Date  . Anxiety   . Arthritis   . Grand mal seizure disorder ([place]) 1950's  . Hx of migraines   . Hypertension   . Insomnia   . Stress     Past Surgical History:  Procedure Laterality Date  . CESAREAN SECTION  1985    Current Outpatient Medications  Medication Sig Dispense Refill  . ALPRAZolam (XANAX) 0.5 MG tablet TAKE 1 TABLET BY MOUTH TWICE DAILY AS NEEDED 60 tablet 5  . enalapril (VASOTEC) 20 MG tablet TAKE 1 TABLET BY MOUTH ONCE DAILY 90 tablet 1  . fluticasone (FLONASE) 50 MCG/ACT nasal spray Place 2 sprays into both nostrils daily. 16 g 0  . hydrochlorothiazide (HYDRODIURIL) 25 MG tablet TAKE ONE-HALF TABLET BY MOUTH ONCE DAILY 45 tablet 2  . Na Sulfate-K Sulfate-Mg Sulf (SUPREP BOWEL PREP KIT) 17.5-3.13-1.6 GM/177ML SOLN  Take 1 kit by mouth as directed. 1 Bottle 0  . PARoxetine (PAXIL) 20 MG tablet TAKE 1 & 1/2 (ONE & ONE-HALF) TABLETS BY MOUTH ONCE DAILY 135 tablet 0   No current facility-administered medications for this visit.    Allergies:  Patient has no known allergies.   Social History: The patient  reports that she has never smoked. She has never used smokeless tobacco. She reports that she does not drink alcohol or use drugs.   Family History: The patient's family history includes Diabetes in her sister; Heart disease in her sister; Hypertension in her mother and sister.   ROS:  Please see the history of present illness. Otherwise, complete review of systems is positive for arthritic knee pains.  All other systems are reviewed and negative.   Physical Exam: VS:  BP 122/70 (BP Location: Left Arm)   Pulse 82   Ht 5' 3" (1.6 m)   Wt 176 lb (79.8 kg)   SpO2 99%   BMI 31.18 kg/m , BMI Body mass index is 31.18 kg/m.  Wt Readings from Last 3 Encounters:  01/10/18 176 lb (79.8 kg)  01/01/18 176 lb 6.4 oz (80 kg)  11/29/17 182 lb 6.4 oz (82.7 kg)    General: Patient appears comfortable at rest. HEENT: Conjunctiva and lids normal, oropharynx clear. Neck: Supple, no elevated JVP or carotid bruits, no thyromegaly. Lungs: Clear to auscultation, nonlabored breathing at  rest. Cardiac: Regular rate and rhythm, no S3 or significant systolic murmur, no pericardial rub. Abdomen: Soft, nontender, bowel sounds present. Extremities: No pitting edema, distal pulses 2+. Skin: Warm and dry. Musculoskeletal: No kyphosis. Neuropsychiatric: Alert and oriented x3, affect grossly appropriate.  ECG: I personally reviewed the tracing from 11/29/2017 which showed normal sinus rhythm.  Recent Labwork: 11/30/2017: ALT 10; AST 15; BUN 11; Creatinine, Ser 0.68; Hemoglobin 10.4; Platelets 281; Potassium 3.8; Sodium 142; TSH 1.570     Component Value Date/Time   CHOL 164 11/30/2017 0922   TRIG 157 (H) 11/30/2017 0922     HDL 45 11/30/2017 0922   CHOLHDL 3.6 11/30/2017 0922   CHOLHDL 3.4 10/29/2014 0856   VLDL 27 10/29/2014 0856   LDLCALC 88 11/30/2017 0922    Assessment and Plan:  1.  Atypical chest pain in the setting of emotional stress, resolved at this point.  Cardiac risk factors include hypertension, family history of heart disease in her sister.  Recent LDL is 88.  She does not smoke cigarettes.  Blood pressure is well controlled today on current regimen.  I reviewed her ECG which is reassuring at rest.  She has not undergone any previous ischemic testing and we will arrange an exercise Myoview.  She does report arthritic knee pain, if this limits her ambulation study can be converted to Lexiscan.  2.  Essential hypertension, blood pressure adequately controlled today on Vasotec and HCTZ.  Keep follow-up with PCP.   Current medicines were reviewed with the patient today.   Orders Placed This Encounter  Procedures  . NM Myocar Multi W/Spect W/Wall Motion / EF    Disposition: Call with test results.  Signed, Samuel G. McDowell, MD, FACC 01/10/2018 11:10 AM    [facility] [address], [place], [place] [address] Phone: [phone]; Fax: [phone] 

## 2018-01-10 ENCOUNTER — Encounter: Payer: Self-pay | Admitting: Cardiology

## 2018-01-10 ENCOUNTER — Ambulatory Visit: Payer: Medicare HMO | Admitting: Cardiology

## 2018-01-10 VITALS — BP 122/70 | HR 82 | Ht 63.0 in | Wt 176.0 lb

## 2018-01-10 DIAGNOSIS — I10 Essential (primary) hypertension: Secondary | ICD-10-CM | POA: Diagnosis not present

## 2018-01-10 DIAGNOSIS — R0789 Other chest pain: Secondary | ICD-10-CM

## 2018-01-10 DIAGNOSIS — Z8249 Family history of ischemic heart disease and other diseases of the circulatory system: Secondary | ICD-10-CM | POA: Diagnosis not present

## 2018-01-10 NOTE — Patient Instructions (Signed)
Your physician recommends that you schedule a follow-up appointment in:  To be determined afte test    Your physician has requested that you have en exercise stress myoview. For further information please visit [email or web address] Please follow instruction sheet, as given.     Your physician recommends that you continue on your current medications as directed. Please refer to the Current Medication list given to you today.    If you need a refill on your cardiac medications before your next appointment, please call your pharmacy.    No lab work ordered today     Thank you for choosing [facility] !

## 2018-01-24 ENCOUNTER — Other Ambulatory Visit (HOSPITAL_COMMUNITY): Payer: Medicare HMO

## 2018-01-24 ENCOUNTER — Encounter (HOSPITAL_COMMUNITY): Payer: Medicare HMO

## 2018-01-24 NOTE — Patient Instructions (Signed)
NEENA BEECHAM  01/24/2018     @   Your procedure is scheduled on 02/03/2018.  Report to [facility] at 6:15 A.M.  Call this number if you have problems the morning of surgery:  [phone]   Remember:  No food or drink after midnight. FOLLOW INSTRUCTIONS GIVEN TO YOU BY DR Evelina Dun OFFICE.     Take these medicines the morning of surgery with A SIP OF WATER Xanax, Vasotec, Flonase, Paxil    Do not wear jewelry, make-up or nail polish.  Do not wear lotions, powders, or perfumes, or deodorant.  Do not shave 48 hours prior to surgery.  Men may shave face and neck.  Do not bring valuables to the hospital.  [facility] is not responsible for any belongings or valuables.  Contacts, dentures or bridgework may not be worn into surgery.  Leave your suitcase in the car.  After surgery it may be brought to your room.  For patients admitted to the hospital, discharge time will be determined by your treatment team.  Patients discharged the day of surgery will not be allowed to drive home.   Please read over the following fact sheets that you were given. Anesthesia Post-op Instructions     PATIENT INSTRUCTIONS POST-ANESTHESIA  IMMEDIATELY FOLLOWING SURGERY:  Do not drive or operate machinery for the first twenty four hours after surgery.  Do not make any important decisions for twenty four hours after surgery or while taking narcotic pain medications or sedatives.  If you develop intractable nausea and vomiting or a severe headache please notify your doctor immediately.  FOLLOW-UP:  Please make an appointment with your surgeon as instructed. You do not need to follow up with anesthesia unless specifically instructed to do so.  WOUND CARE INSTRUCTIONS (if applicable):  Keep a dry clean dressing on the anesthesia/puncture wound site if there is drainage.  Once the wound has quit draining you may leave it open to air.  Generally you should leave the bandage intact for  twenty four hours unless there is drainage.  If the epidural site drains for more than 36-48 hours please call the anesthesia department.  QUESTIONS?:  Please feel free to call your physician or the hospital operator if you have any questions, and they will be happy to assist you.      Esophagogastroduodenoscopy Esophagogastroduodenoscopy (EGD) is a procedure to examine the lining of the esophagus, stomach, and first part of the small intestine (duodenum). This procedure is done to check for problems such as inflammation, bleeding, ulcers, or growths. During this procedure, a long, flexible, lighted tube with a camera attached (endoscope) is inserted down the throat. Tell a health care provider about:  Any allergies you have.  All medicines you are taking, including vitamins, herbs, eye drops, creams, and over-the-counter medicines.  Any problems you or family members have had with anesthetic medicines.  Any blood disorders you have.  Any surgeries you have had.  Any medical conditions you have.  Whether you are pregnant or may be pregnant. What are the risks? Generally, this is a safe procedure. However, problems may occur, including:  Infection.  Bleeding.  A tear (perforation) in the esophagus, stomach, or duodenum.  Trouble breathing.  Excessive sweating.  Spasms of the larynx.  A slowed heartbeat.  Low blood pressure.  What happens before the procedure?  Follow instructions from your health care provider about eating or drinking restrictions.  Ask your health care provider about: ? Changing or stopping  your regular medicines. This is especially important if you are taking diabetes medicines or blood thinners. ? Taking medicines such as aspirin and ibuprofen. These medicines can thin your blood. Do not take these medicines before your procedure if your health care provider instructs you not to.  Plan to have someone take you home after the procedure.  If you  wear dentures, be ready to remove them before the procedure. What happens during the procedure?  To reduce your risk of infection, your health care team will wash or sanitize their hands.  An IV tube will be put in a vein in your hand or arm. You will get medicines and fluids through this tube.  You will be given one or more of the following: ? A medicine to help you relax (sedative). ? A medicine to numb the area (local anesthetic). This medicine may be sprayed into your throat. It will make you feel more comfortable and keep you from gagging or coughing during the procedure. ? A medicine for pain.  A mouth guard may be placed in your mouth to protect your teeth and to keep you from biting on the endoscope.  You will be asked to lie on your left side.  The endoscope will be lowered down your throat into your esophagus, stomach, and duodenum.  Air will be put into the endoscope. This will help your health care provider see better.  The lining of your esophagus, stomach, and duodenum will be examined.  Your health care provider may: ? Take a tissue sample so it can be looked at in a lab (biopsy). ? Remove growths. ? Remove objects (foreign bodies) that are stuck. ? Treat any bleeding with medicines or other devices that stop tissue from bleeding. ? Widen (dilate) or stretch narrowed areas of your esophagus and stomach.  The endoscope will be taken out. The procedure may vary among health care providers and hospitals. What happens after the procedure?  Your blood pressure, heart rate, breathing rate, and blood oxygen level will be monitored often until the medicines you were given have worn off.  Do not eat or drink anything until the numbing medicine has worn off and your gag reflex has returned. This information is not intended to replace advice given to you by your health care provider. Make sure you discuss any questions you have with your health care provider. Document  Released: 12/21/2004 Document Revised: 01/26/2016 Document Reviewed: 07/14/2015 Elsevier Interactive Patient Education  2018 [employer]. Colonoscopy, Adult A colonoscopy is an exam to look at the entire large intestine. During the exam, a lubricated, bendable tube is inserted into the anus and then passed into the rectum, colon, and other parts of the large intestine. A colonoscopy is often done as a part of normal colorectal screening or in response to certain symptoms, such as anemia, persistent diarrhea, abdominal pain, and blood in the stool. The exam can help screen for and diagnose medical problems, including:  Tumors.  Polyps.  Inflammation.  Areas of bleeding.  Tell a health care provider about:  Any allergies you have.  All medicines you are taking, including vitamins, herbs, eye drops, creams, and over-the-counter medicines.  Any problems you or family members have had with anesthetic medicines.  Any blood disorders you have.  Any surgeries you have had.  Any medical conditions you have.  Any problems you have had passing stool. What are the risks? Generally, this is a safe procedure. However, problems may occur, including:  Bleeding.  A tear in the intestine.  A reaction to medicines given during the exam.  Infection (rare).  What happens before the procedure? Eating and drinking restrictions Follow instructions from your health care provider about eating and drinking, which may include:  A few days before the procedure - follow a low-fiber diet. Avoid nuts, seeds, dried fruit, raw fruits, and vegetables.  1-3 days before the procedure - follow a clear liquid diet. Drink only clear liquids, such as clear broth or bouillon, black coffee or tea, clear juice, clear soft drinks or sports drinks, gelatin dessert, and popsicles. Avoid any liquids that contain red or purple dye.  On the day of the procedure - do not eat or drink anything during the 2 hours  before the procedure, or within the time period that your health care provider recommends.  Bowel prep If you were prescribed an oral bowel prep to clean out your colon:  Take it as told by your health care provider. Starting the day before your procedure, you will need to drink a large amount of medicated liquid. The liquid will cause you to have multiple loose stools until your stool is almost clear or light green.  If your skin or anus gets irritated from diarrhea, you may use these to relieve the irritation: ? Medicated wipes, such as adult wet wipes with aloe and vitamin E. ? A skin soothing-product like petroleum jelly.  If you vomit while drinking the bowel prep, take a break for up to 60 minutes and then begin the bowel prep again. If vomiting continues and you cannot take the bowel prep without vomiting, call your health care provider.  General instructions  Ask your health care provider about changing or stopping your regular medicines. This is especially important if you are taking diabetes medicines or blood thinners.  Plan to have someone take you home from the hospital or clinic. What happens during the procedure?  An IV tube may be inserted into one of your veins.  You will be given medicine to help you relax (sedative).  To reduce your risk of infection: ? Your health care team will wash or sanitize their hands. ? Your anal area will be washed with soap.  You will be asked to lie on your side with your knees bent.  Your health care provider will lubricate a long, thin, flexible tube. The tube will have a camera and a light on the end.  The tube will be inserted into your anus.  The tube will be gently eased through your rectum and colon.  Air will be delivered into your colon to keep it open. You may feel some pressure or cramping.  The camera will be used to take images during the procedure.  A small tissue sample may be removed from your body to be examined  under a microscope (biopsy). If any potential problems are found, the tissue will be sent to a lab for testing.  If small polyps are found, your health care provider may remove them and have them checked for cancer cells.  The tube that was inserted into your anus will be slowly removed. The procedure may vary among health care providers and hospitals. What happens after the procedure?  Your blood pressure, heart rate, breathing rate, and blood oxygen level will be monitored until the medicines you were given have worn off.  Do not drive for 24 hours after the exam.  You may have a small amount of blood in your stool.  You may pass gas and have mild abdominal cramping or bloating due to the air that was used to inflate your colon during the exam.  It is up to you to get the results of your procedure. Ask your health care provider, or the department performing the procedure, when your results will be ready. This information is not intended to replace advice given to you by your health care provider. Make sure you discuss any questions you have with your health care provider. Document Released: 08/17/2000 Document Revised: 06/20/2016 Document Reviewed: 11/01/2015 Elsevier Interactive Patient Education  2018 [employer].

## 2018-01-28 ENCOUNTER — Encounter (HOSPITAL_COMMUNITY)
Admission: RE | Admit: 2018-01-28 | Discharge: 2018-01-28 | Disposition: A | Discharge status: Home / Self Care | Payer: Medicare HMO | Source: Ambulatory Visit | Attending: Gastroenterology | Admitting: Gastroenterology

## 2018-01-28 ENCOUNTER — Encounter (HOSPITAL_COMMUNITY): Payer: Self-pay

## 2018-01-28 ENCOUNTER — Other Ambulatory Visit: Payer: Self-pay

## 2018-01-28 DIAGNOSIS — Z01812 Encounter for preprocedural laboratory examination: Secondary | ICD-10-CM | POA: Diagnosis present

## 2018-01-28 HISTORY — DX: Anemia, unspecified: D64.9

## 2018-01-28 HISTORY — DX: Headache, unspecified: R51.9

## 2018-01-28 HISTORY — DX: Headache: R51

## 2018-01-28 LAB — CBC
HCT: 36.7 % (ref 36.0–46.0)
Hemoglobin: 10.9 g/dL — ABNORMAL LOW (ref 12.0–15.0)
MCH: 24.3 pg — AB (ref 26.0–34.0)
MCHC: 29.7 g/dL — AB (ref 30.0–36.0)
MCV: 81.9 fL (ref 78.0–100.0)
PLATELETS: 306 10*3/uL (ref 150–400)
RBC: 4.48 MIL/uL (ref 3.87–5.11)
RDW: 13.6 % (ref 11.5–15.5)
WBC: 10.8 10*3/uL — ABNORMAL HIGH (ref 4.0–10.5)

## 2018-01-28 LAB — BASIC METABOLIC PANEL
Anion gap: 11 (ref 5–15)
BUN: 19 mg/dL (ref 6–20)
CHLORIDE: 96 mmol/L — AB (ref 101–111)
CO2: 28 mmol/L (ref 22–32)
CREATININE: 0.77 mg/dL (ref 0.44–1.00)
Calcium: 9.3 mg/dL (ref 8.9–10.3)
GFR calc Af Amer: >60 mL/min (ref >60)
GFR calc non Af Amer: >60 mL/min (ref >60)
Glucose, Bld: 91 mg/dL (ref 65–99)
Potassium: 3.5 mmol/L (ref 3.5–5.1)
SODIUM: 135 mmol/L (ref 135–145)

## 2018-01-31 NOTE — Progress Notes (Signed)
CC'D TO PCP °

## 2018-02-03 ENCOUNTER — Ambulatory Visit (HOSPITAL_COMMUNITY): Payer: Medicare HMO | Admitting: Anesthesiology

## 2018-02-03 ENCOUNTER — Ambulatory Visit (HOSPITAL_COMMUNITY)
Admission: RE | Admit: 2018-02-03 | Discharge: 2018-02-03 | Disposition: A | Discharge status: Home / Self Care | Payer: Medicare HMO | Source: Ambulatory Visit | Attending: Gastroenterology | Admitting: Gastroenterology

## 2018-02-03 ENCOUNTER — Encounter (HOSPITAL_COMMUNITY)
Admission: RE | Disposition: A | Discharge status: Home / Self Care | Payer: Self-pay | Source: Ambulatory Visit | Attending: Gastroenterology

## 2018-02-03 ENCOUNTER — Encounter (HOSPITAL_COMMUNITY): Payer: Self-pay

## 2018-02-03 DIAGNOSIS — Z8371 Family history of colonic polyps: Secondary | ICD-10-CM | POA: Insufficient documentation

## 2018-02-03 DIAGNOSIS — D649 Anemia, unspecified: Secondary | ICD-10-CM | POA: Insufficient documentation

## 2018-02-03 DIAGNOSIS — K2970 Gastritis, unspecified, without bleeding: Secondary | ICD-10-CM | POA: Diagnosis not present

## 2018-02-03 DIAGNOSIS — I10 Essential (primary) hypertension: Secondary | ICD-10-CM | POA: Diagnosis not present

## 2018-02-03 DIAGNOSIS — G4700 Insomnia, unspecified: Secondary | ICD-10-CM | POA: Diagnosis not present

## 2018-02-03 DIAGNOSIS — K648 Other hemorrhoids: Secondary | ICD-10-CM | POA: Diagnosis not present

## 2018-02-03 DIAGNOSIS — B9681 Helicobacter pylori [H. pylori] as the cause of diseases classified elsewhere: Secondary | ICD-10-CM | POA: Diagnosis not present

## 2018-02-03 DIAGNOSIS — Z800 Family history of malignant neoplasm of digestive organs: Secondary | ICD-10-CM | POA: Diagnosis not present

## 2018-02-03 DIAGNOSIS — K2950 Unspecified chronic gastritis without bleeding: Secondary | ICD-10-CM | POA: Diagnosis not present

## 2018-02-03 DIAGNOSIS — Z79899 Other long term (current) drug therapy: Secondary | ICD-10-CM | POA: Diagnosis not present

## 2018-02-03 DIAGNOSIS — F419 Anxiety disorder, unspecified: Secondary | ICD-10-CM | POA: Diagnosis not present

## 2018-02-03 DIAGNOSIS — R69 Illness, unspecified: Secondary | ICD-10-CM | POA: Diagnosis not present

## 2018-02-03 DIAGNOSIS — K644 Residual hemorrhoidal skin tags: Secondary | ICD-10-CM | POA: Insufficient documentation

## 2018-02-03 HISTORY — PX: BIOPSY: SHX5522

## 2018-02-03 HISTORY — PX: COLONOSCOPY WITH PROPOFOL: SHX5780

## 2018-02-03 HISTORY — PX: ESOPHAGOGASTRODUODENOSCOPY (EGD) WITH PROPOFOL: SHX5813

## 2018-02-03 SURGERY — COLONOSCOPY WITH PROPOFOL
Anesthesia: Monitor Anesthesia Care

## 2018-02-03 MED ORDER — MIDAZOLAM HCL 5 MG/5ML IJ SOLN
INTRAMUSCULAR | Status: DC | PRN
  Administered 2018-02-03 (×2): 1 mg via INTRAVENOUS
  Administered 2018-02-03: 2 mg via INTRAVENOUS

## 2018-02-03 MED ORDER — MIDAZOLAM HCL 2 MG/2ML IJ SOLN
INTRAMUSCULAR | Status: AC
  Filled 2018-02-03: qty 4

## 2018-02-03 MED ORDER — CHLORHEXIDINE GLUCONATE CLOTH 2 % EX PADS: 6.0000 | MEDICATED_PAD | Freq: Once | CUTANEOUS | Status: DC

## 2018-02-03 MED ORDER — PROPOFOL 10 MG/ML IV BOLUS
INTRAVENOUS | Status: AC
  Filled 2018-02-03: qty 40

## 2018-02-03 MED ORDER — LACTATED RINGERS IV SOLN: INTRAVENOUS | Status: DC

## 2018-02-03 MED ORDER — MEPERIDINE HCL 100 MG/ML IJ SOLN: 6.2500 mg | INTRAMUSCULAR | Status: DC | PRN

## 2018-02-03 MED ORDER — HYDROMORPHONE HCL 1 MG/ML IJ SOLN: 0.2500 mg | INTRAMUSCULAR | Status: DC | PRN

## 2018-02-03 MED ORDER — PROMETHAZINE HCL 25 MG/ML IJ SOLN: 6.2500 mg | INTRAMUSCULAR | Status: DC | PRN

## 2018-02-03 MED ORDER — PROPOFOL 10 MG/ML IV BOLUS
INTRAVENOUS | Status: AC
  Filled 2018-02-03: qty 20

## 2018-02-03 MED ORDER — GLYCOPYRROLATE 0.2 MG/ML IJ SOLN
INTRAMUSCULAR | Status: DC | PRN
  Administered 2018-02-03: 0.2 mg via INTRAVENOUS

## 2018-02-03 MED ORDER — LACTATED RINGERS IV SOLN
INTRAVENOUS | Status: DC
  Administered 2018-02-03: 07:00:00 via INTRAVENOUS

## 2018-02-03 MED ORDER — PROPOFOL 500 MG/50ML IV EMUL
INTRAVENOUS | Status: DC | PRN
  Administered 2018-02-03: 08:00:00 via INTRAVENOUS
  Administered 2018-02-03: 100 ug/kg/min via INTRAVENOUS

## 2018-02-03 MED ORDER — HYDROCODONE-ACETAMINOPHEN 7.5-325 MG PO TABS: 1.0000 | ORAL_TABLET | Freq: Once | ORAL | Status: DC | PRN

## 2018-02-03 NOTE — Anesthesia Postprocedure Evaluation (Signed)
Anesthesia Post Note  Patient: RYLYNNE SCHICKER  Procedure(s) Performed: COLONOSCOPY WITH PROPOFOL (N/A ) ESOPHAGOGASTRODUODENOSCOPY (EGD) WITH PROPOFOL (N/A ) BIOPSY  Patient location during evaluation: PACU Anesthesia Type: MAC Level of consciousness: awake and alert and patient cooperative Pain management: pain level controlled Vital Signs Assessment: post-procedure vital signs reviewed and stable Respiratory status: spontaneous breathing, nonlabored ventilation and respiratory function stable Cardiovascular status: blood pressure returned to baseline Postop Assessment: no apparent nausea or vomiting Anesthetic complications: no     Last Vitals:  Vitals:   02/03/18 0705 02/03/18 0710  BP: 135/72 135/72  Pulse:    Resp: (!) 24 (!) 22  Temp:    SpO2: 99% 99%    Last Pain:  Vitals:   02/03/18 0637  TempSrc: Oral  PainSc: 0-No pain                 Keatin Benham J

## 2018-02-03 NOTE — Op Note (Signed)
[facility] Patient Name: Melissa Wiley Procedure Date: 02/03/2018 8:01 AM MRN: [id number] Date of Birth: 1951/01/13 Attending MD: Jonette Eva MD, MD CSN: [id number] Age: 67 Admit Type: Ambulatory Procedure:                Upper GI endoscopy WITH COLD FORCEPS BIOPSY Indications:              Anemia,NORMOCYTIC-MAY 2019 Hb 10.9 MCV 81.9 Providers:                Jonette Eva MD, MD, Nena Polio, RN, Edythe Clarity,                            Technician Referring MD:             Simone Curia, MD Medicines:                Propofol per Anesthesia Complications:            No immediate complications. Estimated Blood Loss:     Estimated blood loss was minimal. Procedure:                Pre-Anesthesia Assessment:                           - Prior to the procedure, a History and Physical                            was performed, and patient medications and                            allergies were reviewed. The patient's tolerance of                            previous anesthesia was also reviewed. The risks                            and benefits of the procedure and the sedation                            options and risks were discussed with the patient.                            All questions were answered, and informed consent                            was obtained. Prior Anticoagulants: The patient has                            taken no previous anticoagulant or antiplatelet                            agents. ASA Grade Assessment: II - A patient with                            mild systemic disease. After reviewing the risks  and benefits, the patient was deemed in                            satisfactory condition to undergo the procedure.                            After obtaining informed consent, the endoscope was                            passed under direct vision. Throughout the                            procedure, the patient's blood pressure,  pulse, and                            oxygen saturations were monitored continuously. The                            [id number] ([id number]) scope was introduced through the                            mouth, and advanced to the second part of duodenum.                            The upper GI endoscopy was accomplished without                            difficulty. The patient tolerated the procedure                            fairly well. Scope In: 8:08:04 AM Scope Out: 8:17:46 AM Total Procedure Duration: 0 hours 9 minutes 42 seconds  Findings:      The examined esophagus was normal.      Mild inflammation characterized by congestion (edema) and erythema was       found in the gastric antrum. Biopsies were taken with a cold forceps for       Helicobacter pylori testing.      The examined duodenum was normal. Biopsies for histology were taken with       a cold forceps for evaluation of celiac disease. Impression:               - NO OBVIOUS SOURCE FOR ANEMIA IDENTIFIED.                           - MILD Gastritis. Moderate Sedation:      Per Anesthesia Care Recommendation:           - Await pathology results.                           - Resume previous diet.                           - Continue present medications.                           -  Return to my office in 2 months.                           - Patient has a contact number available for                            emergencies. The signs and symptoms of potential                            delayed complications were discussed with the                            patient. Return to normal activities tomorrow.                            Written discharge instructions were provided to the                            patient. Procedure Code(s):        --- Professional ---                           [phone], Esophagogastroduodenoscopy, flexible,                            transoral; with biopsy, single or multiple Diagnosis Code(s):        ---  Professional ---                           K29.70, Gastritis, unspecified, without bleeding                           D64.9, Anemia, unspecified CPT copyright 2017 American Medical Association. All rights reserved. The codes documented in this report are preliminary and upon coder review may  be revised to meet current compliance requirements. Jonette Eva, MD Jonette Eva MD, MD 02/03/2018 8:45:52 AM This report has been signed electronically. Number of Addenda: 0

## 2018-02-03 NOTE — Op Note (Signed)
[facility] Patient Name: Melissa Bimleratricia Wiley Procedure Date: 02/03/2018 7:17 AM MRN: [id number] Date of Birth: 1951/03/13 Attending MD: Jonette EvaSandi Drisana Schweickert MD, MD CSN: [id number] Age: [age] Admit Type: Outpatient Procedure:                Colonoscopy, DIAGNOSTIC Indications:              Anemia Providers:                Jonette EvaSandi Yazmine Sorey MD, MD, Nena PolioLisa Moore, RN, Edythe ClarityKelly Cox,                            Technician Referring MD:             Simone CuriaStephen Luking, MD Medicines:                Propofol per Anesthesia Complications:            No immediate complications. Estimated Blood Loss:     Estimated blood loss: none. Procedure:                Pre-Anesthesia Assessment:                           - Prior to the procedure, a History and Physical                            was performed, and patient medications and                            allergies were reviewed. The patient's tolerance of                            previous anesthesia was also reviewed. The risks                            and benefits of the procedure and the sedation                            options and risks were discussed with the patient.                            All questions were answered, and informed consent                            was obtained. Prior Anticoagulants: The patient has                            taken no previous anticoagulant or antiplatelet                            agents. ASA Grade Assessment: II - A patient with                            mild systemic disease. After reviewing the risks  and benefits, the patient was deemed in                            satisfactory condition to undergo the procedure.                            After obtaining informed consent, the colonoscope                            was passed under direct vision. Throughout the                            procedure, the patient's blood pressure, pulse, and                            oxygen saturations were  monitored continuously. The                            [id number] ([id number]) scope was introduced through                            the anus and advanced to the 15 cm into the ileum.                            The colonoscopy was somewhat difficult due to a                            tortuous colon. Successful completion of the                            procedure was aided by straightening and shortening                            the scope to obtain bowel loop reduction and                            COLOWRAP. The patient tolerated the procedure well.                            The quality of the bowel preparation was good. The                            terminal ileum, ileocecal valve, appendiceal                            orifice, and rectum were photographed. Scope In: 7:42:26 AM Scope Out: 8:00:20 AM Scope Withdrawal Time: 0 hours 13 minutes 35 seconds  Total Procedure Duration: 0 hours 17 minutes 54 seconds  Findings:      The terminal ileum appeared normal.      The recto-sigmoid colon, sigmoid colon and descending colon were       moderately tortuous.      External and internal hemorrhoids were found. The hemorrhoids were small. Impression:               -  The examined portion of the ileum was normal.                           - Tortuous LEFT colon.                           - External and internal hemorrhoids. Moderate Sedation:      Per Anesthesia Care Recommendation:           - High fiber diet.                           - Continue present medications.                           - Repeat colonoscopy is not recommended due to                            current age (78 years or older) for surveillance.                           - Patient has a contact number available for                            emergencies. The signs and symptoms of potential                            delayed complications were discussed with the                            patient. Return to normal  activities tomorrow.                            Written discharge instructions were provided to the                            patient. Procedure Code(s):        --- Professional ---                           [phone], Colonoscopy, flexible; diagnostic, including                            collection of specimen(s) by brushing or washing,                            when performed (separate procedure) Diagnosis Code(s):        --- Professional ---                           U04.5, Other hemorrhoids                           D64.9, Anemia, unspecified                           Q43.8, Other specified congenital malformations of  intestine CPT copyright 2017 American Medical Association. All rights reserved. The codes documented in this report are preliminary and upon coder review may  be revised to meet current compliance requirements. Jonette Eva, MD Jonette Eva MD, MD 02/03/2018 8:40:04 AM This report has been signed electronically. Number of Addenda: 0

## 2018-02-03 NOTE — H&P (Signed)
Primary Care Physician:  Merlyn AlbertLuking, William S, MD Primary Gastroenterologist:  Dr. Darrick PennaFields  Pre-Procedure History & Physical: HPI:  Melissa Wiley is a 67 y.o. female here for anemia.  Past Medical History:  Diagnosis Date  . Anemia   . Anxiety   . Arthritis   . 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 Headache   . Hx of migraines   . Hypertension   . Insomnia   . Stress     Past Surgical History:  Procedure Laterality Date  . CESAREAN SECTION  1985    Prior to Admission medications   Medication Sig Start Date End Date Taking? Authorizing Provider  ALPRAZolam Prudy Feeler(XANAX) 0.5 MG tablet Take 0.5 mg by mouth at bedtime.   Yes [provider]  enalapril (VASOTEC) 20 MG tablet TAKE 1 TABLET BY MOUTH ONCE DAILY 09/16/17  Yes Merlyn AlbertLuking, William S, MD  fluticasone [facility](FLONASE) 50 MCG/ACT nasal spray Place 2 sprays into both nostrils daily. Patient taking differently: Place 2 sprays into both nostrils daily as needed for allergies.  06/11/16 01/20/18 Yes Merlyn AlbertLuking, William S, MD  Garlic 100 MG TABS Take 1 tablet by mouth daily.   Yes [provider]  hydrochlorothiazide (HYDRODIURIL) 25 MG tablet TAKE ONE-HALF TABLET BY MOUTH ONCE DAILY 07/05/17  Yes Merlyn AlbertLuking, William S, MD  Multiple Vitamins-Minerals (MULTIVITAMIN WITH MINERALS) tablet Take 1 tablet by mouth daily.   Yes [provider]  PARoxetine (PAXIL) 20 MG tablet TAKE 1 & 1/2 (ONE & ONE-HALF) TABLETS BY MOUTH ONCE DAILY 12/04/17  Yes Campbell RichesHoskins, Carolyn C, NP    Allergies as of 01/02/2018  . (No Known Allergies)    Family History  Problem Relation Age of Onset  . Hypertension Mother   . Hypertension Sister   . Diabetes Sister   . Heart disease Sister   . Colon cancer Neg Hx   . Colon polyps Neg Hx     Social History   Socioeconomic History  . Marital status: Single    Spouse name: Not on file  . Number of children: Not on file  . Years of education: Not on file  . Highest education level: Not on file   Occupational History  . Not on file  Social Needs  . Financial resource strain: Not on file  . Food insecurity:    Worry: Not on file    Inability: Not on file  . Transportation needs:    Medical: Not on file    Non-medical: Not on file  Tobacco Use  . Smoking status: Never Smoker  . Smokeless tobacco: Never Used  Substance and Sexual Activity  . Alcohol use: Never    Frequency: Never  . Drug use: Never  . Sexual activity: Yes  Lifestyle  . Physical activity:    Days per week: Not on file    Minutes per session: Not on file  . Stress: Not on file  Relationships  . Social connections:    Talks on phone: Not on file    Gets together: Not on file    Attends religious service: Not on file    Active member of club or organization: Not on file    Attends meetings of clubs or organizations: Not on file    Relationship status: Not on file  . Intimate partner violence:    Fear of current or ex partner: Not on file    Emotionally abused: Not on file    Physically abused: Not on file  Forced sexual activity: Not on file  Other Topics Concern  . Not on file  Social History Narrative   SPECIAL NEEDS CHILD PASSED IN 2019.    Review of Systems: See HPI, otherwise negative ROS   Physical Exam: BP 135/72   Pulse 84   Temp 97.9 F (36.6 C) (Oral)   Resp (!) 22   SpO2 99%  General:   Alert,  pleasant and cooperative in NAD Head:  Normocephalic and atraumatic. Neck:  Supple; Lungs:  Clear throughout to auscultation.    Heart:  Regular rate and rhythm. Abdomen:  Soft, nontender and nondistended. Normal bowel sounds, without guarding, and without rebound.   Neurologic:  Alert and  oriented x4;  grossly normal neurologically.  Impression/Plan:     Anemia/BRBPR  PLAN:  1. TCS/?EGD TODAY DISCUSSED PROCEDURE, BENEFITS, & RISKS: < 1% chance of medication reaction, bleeding, perforation, or rupture of spleen/liver.

## 2018-02-03 NOTE — Discharge Instructions (Signed)
NO SOURCE FOR YOUR LOW BLOOD COUNT WAS IDENTIFIED. YOU DID NOT HAVE ANY POLYPS. You have A GASTRITIS,  And small internal hemorrhoids. I biopsied your stomach AND SMALL BOWEL.    EAT FOOD THAT CONTAINS IRON. SEE INFO BELOW.  FOLLOW A HIGH FIBER DIET. AVOID ITEMS THAT CAUSE BLOATING. SEE INFO BELOW.  YOUR BIOPSY WILL BE BACK IN 7 DAYS.  FOLLOW UP IN AUG 2019.     ENDOSCOPY Care After Read the instructions outlined below and refer to this sheet in the next week. These discharge instructions provide you with general information on caring for yourself after you leave the hospital. While your treatment has been planned according to the most current medical practices available, unavoidable complications occasionally occur. If you have any problems or questions after discharge, call DR. Neviah Braud, [phone].  ACTIVITY  You may resume your regular activity, but move at a slower pace for the next 24 hours.   Take frequent rest periods for the next 24 hours.   Walking will help get rid of the air and reduce the bloated feeling in your belly (abdomen).   No driving for 24 hours (because of the medicine (anesthesia) used during the test).   You may shower.   Do not sign any important legal documents or operate any machinery for 24 hours (because of the anesthesia used during the test).    NUTRITION  Drink plenty of fluids.   You may resume your normal diet as instructed by your doctor.   Begin with a light meal and progress to your normal diet. Heavy or fried foods are harder to digest and may make you feel sick to your stomach (nauseated).   Avoid alcoholic beverages for 24 hours or as instructed.    MEDICATIONS  You may resume your normal medications.   WHAT YOU CAN EXPECT TODAY  Some feelings of bloating in the abdomen.   Passage of more gas than usual.   Spotting of blood in your stool or on the toilet paper  .  IF YOU HAD POLYPS REMOVED DURING THE ENDOSCOPY:  Eat  a soft diet IF YOU HAVE NAUSEA, BLOATING, ABDOMINAL PAIN, OR VOMITING.    FINDING OUT THE RESULTS OF YOUR TEST Not all test results are available during your visit. DR. Darrick Penna WILL CALL YOU WITHIN 14 DAYS OF YOUR PROCEDUE WITH YOUR RESULTS. Do not assume everything is normal if you have not heard from DR. Necie Wilcoxson, CALL HER OFFICE AT [phone].  SEEK IMMEDIATE MEDICAL ATTENTION AND CALL THE OFFICE: [phone] IF:  You have more than a spotting of blood in your stool.   Your belly is swollen (abdominal distention).   You are nauseated or vomiting.   You have a temperature over 101F.   You have abdominal pain or discomfort that is severe or gets worse throughout the day.   Gastritis  Gastritis is an inflammation (the body's way of reacting to injury and/or infection) of the stomach. It is often caused by viral or bacterial (germ) infections. It can also be caused BY ASPIRIN, BC/GOODY POWDER'S, (IBUPROFEN) MOTRIN, OR ALEVE (NAPROXEN), chemicals (including alcohol), SPICY FOODS, and medications. This illness may be associated with generalized malaise (feeling tired, not well), UPPER ABDOMINAL STOMACH cramps, and fever. One common bacterial cause of gastritis is an organism known as H. Pylori. This can be treated with antibiotics.    High-Fiber Diet A high-fiber diet changes your normal diet to include more whole grains, legumes, fruits, and vegetables. Changes in the diet  involve replacing refined carbohydrates with unrefined foods. The calorie level of the diet is essentially unchanged. The Dietary Reference Intake (recommended amount) for adult males is 38 grams per day. For adult females, it is 25 grams per day. Pregnant and lactating women should consume 28 grams of fiber per day. Fiber is the intact part of a plant that is not broken down during digestion. Functional fiber is fiber that has been isolated from the plant to provide a beneficial effect in the body.  PURPOSE  Increase  stool bulk.   Ease and regulate bowel movements.   Lower cholesterol.   REDUCE RISK OF COLON CANCER  INDICATIONS THAT YOU NEED MORE FIBER  Constipation and hemorrhoids.   Uncomplicated diverticulosis (intestine condition) and irritable bowel syndrome.   Weight management.   As a protective measure against hardening of the arteries (atherosclerosis), diabetes, and cancer.   GUIDELINES FOR INCREASING FIBER IN THE DIET  Start adding fiber to the diet slowly. A gradual increase of about 5 more grams (2 slices of whole-wheat bread, 2 servings of most fruits or vegetables, or 1 bowl of high-fiber cereal) per day is best. Too rapid an increase in fiber may result in constipation, flatulence, and bloating.   Drink enough water and fluids to keep your urine clear or pale yellow. Water, juice, or caffeine-free drinks are recommended. Not drinking enough fluid may cause constipation.   Eat a variety of high-fiber foods rather than one type of fiber.   Try to increase your intake of fiber through using high-fiber foods rather than fiber pills or supplements that contain small amounts of fiber.   The goal is to change the types of food eaten. Do not supplement your present diet with high-fiber foods, but replace foods in your present diet.   INCLUDE A VARIETY OF FIBER SOURCES  Replace refined and processed grains with whole grains, canned fruits with fresh fruits, and incorporate other fiber sources. White rice, white breads, and most bakery goods contain little or no fiber.   Brown whole-grain rice, buckwheat oats, and many fruits and vegetables are all good sources of fiber. These include: broccoli, Brussels sprouts, cabbage, cauliflower, beets, sweet potatoes, white potatoes (skin on), carrots, tomatoes, eggplant, squash, berries, fresh fruits, and dried fruits.   Cereals appear to be the richest source of fiber. Cereal fiber is found in whole grains and bran. Bran is the fiber-rich outer  coat of cereal grain, which is largely removed in refining. In whole-grain cereals, the bran remains. In breakfast cereals, the largest amount of fiber is found in those with "bran" in their names. The fiber content is sometimes indicated on the label.   You may need to include additional fruits and vegetables each day.   In baking, for 1 cup white flour, you may use the following substitutions:   1 cup whole-wheat flour minus 2 tablespoons.   1/2 cup white flour plus 1/2 cup whole-wheat flour.  Hemorrhoids Hemorrhoids are dilated (enlarged) veins around the rectum. Sometimes clots will form in the veins. This makes them swollen and painful. These are called thrombosed hemorrhoids. Causes of hemorrhoids include:  Constipation.   Straining to have a bowel movement.   HEAVY LIFTING  HOME CARE INSTRUCTIONS  Eat a well balanced diet and drink 6 to 8 glasses of water every day to avoid constipation. You may also use a bulk laxative.   Avoid straining to have bowel movements.   Keep anal area dry and clean.   Do not  use a donut shaped pillow or sit on the toilet for long periods. This increases blood pooling and pain.   Move your bowels when your body has the urge; this will require less straining and will decrease pain and pressure.    High Iron Diet  Purpose Iron is a mineral essential for life. Found in red blood cells, irons primary role is to carry oxygen from the lungs to the rest of the body. Without oxygen, the bodys cells cannot function normally. If the bodys iron stores become too low, an iron-deficiency anemia can occur. This is characterized by weakness, lethargy, muscle fatigue, and shortness of breath. In severe cases, a persons skin may become pale due to a lack of red blood cells in the body. In adults, iron deficiency is most commonly caused by chronic blood loss, such as with heavy menstruation or intestinal bleeding from peptic ulcers, cancer, or hemorrhoids. In  children, iron deficiency is usually the result of an inadequate iron intake.  Nutrition Facts The recommended dietary allowance (RDA) for iron in healthy adults is 10 milligrams per day for men and 15 milligrams per day for premenopausal women. Premenopausal womens needs are higher than mens needs because women lose iron during menstruation. It is generally easier for men to get enough iron than it is for women. Because they are usually bigger, men have higher calorie needs and will most likely eat enough food to meet their iron requirements. Women, on the other hand, tend to eat less. This makes it more difficult for them to meet their iron needs. It is, therefore, particularly important for premenopausal women to eat foods high in iron. Pregnant women will need as much as 30 milligrams of iron per day. The main reason is because the unborn baby needs iron for development. As a result, it will draw from the mothers iron stores. This can quickly deplete a woman of iron if she is not eating enough iron rich foods. The following table lists foods high in iron. In general, meat, fish, and poultry are excellent sources. Other sources of iron include beans, dried fruits, whole grains, fortified cereals, and enriched breads.   Special Considerations  1. Heme and nonhemd iron are two forms of iron in foods. Heme iron is found in meats, poultry, and fish. NonHeme iron is found in both plant and animal foods. Heme iron is more easily absorbed by the body than nonheme iron. However, heme iron can also promote the absorption of non-heme iron. Therefore, eating beef and beans, for example, is good for providing adequate absorption of both types of iron. 2. Vitamin C also promotes iron absorption. This is true for both heme and nonheme iron. It is, therefore, beneficial to consume citrus fruits or juices, which are high in vitamin C, with foods that contain iron. For example, a meal might include a lean sirloin  steak (heme iron source), baked potato (nonheme iron source , broccoli (nonheme iroj source), and an orange (vitamin C source) for a good iron intake. 3. Phytic and tannic aids are two food components that, when consumed in large amounts, prevent the abrorption of iron. Phytic acid is found in rye bread and other foods made from whole grains. Phytic acid is also found in nonherbal teas. Tannic acid is found in commercial black and pekoe teas, coffee, cola drinks, chocolate, and red wines. 4. Iron SupplementsThere are many different kinds of iron supplements. However, iron supplemants should only be taken when there is a true deficiency  of iron and only under medical supervision. General multivitamins often have iron and other minerals added to them in moderate amounts. If otherwise healthy, this amount of iron is probably not harmful. If iron is to be avoided, multivitamins containing iron should not be used.Please note that it is important to keep iron and multivita-in supplements safely away from a childs reach. If ingested, severe poisoning can occur.   Foods That Contain Iron  Food Serving Size (mg)  Bran flakes cereal 1 cup 24.0  Product 19 cereal 1 cup 24.0  Clams, steamed 3 oz 23.8  Total cereal 1 cup 18.0  Life cereal 1 cup 12.2  Raisin bran cereal 1 cup 9.3  Beef liver, braised 3 oz 5.8  Kix cereal 1 cup 5.4  Cheerios cereal 1 cup 3.6  Prune juice 1 cup 3.0  Potato, baked with skin 1 med 2.8  Sirloin steak, cooked 3 oz 2.8  Shrimp, cooked 3 oz 2.6  Navy beans, cooked 1/2 cup 2.3  Figs, dried 5 2.1  Lean ground beef, broiled 3 oz 2.1  Swiss chard, cooked 1/2 cup 2.0  Rice krispies cereal 1 cup 1.8  Kidney beans 1/2 cup 1.6  Oatmeal, cooked 1/2 cup 1.6  Spinach, raw 1 cup 1.5  Tuna, canned in water 3 oz 1.3  Green peas, conked 1/2 cup 1.2  Halibut, cooked 3 oz 0.9  Whole-wheat bread 1 slice 0.9  Apricot halves, dried 5 0.8  Raisins 1/4 cup 0.8  Broccoli, cooked 1/2 cup 0.6    Egg, boiled 1 large 0.6

## 2018-02-03 NOTE — Transfer of Care (Signed)
Immediate Anesthesia Transfer of Care Note  Patient: Melissa Wiley  Procedure(s) Performed: COLONOSCOPY WITH PROPOFOL (N/A ) ESOPHAGOGASTRODUODENOSCOPY (EGD) WITH PROPOFOL (N/A ) BIOPSY  Patient Location: PACU  Anesthesia Type:MAC  Level of Consciousness: awake and patient cooperative  Airway & Oxygen Therapy: Patient Spontanous Breathing and Patient connected to nasal cannula oxygen  Post-op Assessment: Report given to RN, Post -op Vital signs reviewed and stable and Patient moving all extremities  Post vital signs: Reviewed and stable  Last Vitals:  Vitals Value Taken Time  BP    Temp    Pulse    Resp    SpO2      Last Pain:  Vitals:   02/03/18 0637  TempSrc: Oral  PainSc: 0-No pain         Complications: No apparent anesthesia complications

## 2018-02-03 NOTE — Anesthesia Preprocedure Evaluation (Signed)
Anesthesia Evaluation  Patient identified by MRN, date of birth, ID band Patient awake    Reviewed: Allergy & Precautions, H&P , NPO status , Patient's Chart, lab work & pertinent test results, reviewed documented beta blocker date and time   Airway Mallampati: II  TM Distance: >3 FB Neck ROM: full    Dental no notable dental hx. (+) Teeth Intact, Dental Advidsory Given   Pulmonary neg pulmonary ROS,    Pulmonary exam normal breath sounds clear to auscultation       Cardiovascular Exercise Tolerance: Good hypertension, On Medications negative cardio ROS   Rhythm:regular Rate:Normal     Neuro/Psych  Headaches, Seizures -,  Anxiety Depression negative neurological ROS  negative psych ROS   GI/Hepatic negative GI ROS, Neg liver ROS,   Endo/Other  negative endocrine ROS  Renal/GU negative Renal ROS  negative genitourinary   Musculoskeletal   Abdominal   Peds  Hematology negative hematology ROS (+) anemia ,   Anesthesia Other Findings   Reproductive/Obstetrics negative OB ROS                             Anesthesia Physical Anesthesia Plan  ASA: III  Anesthesia Plan: MAC   Post-op Pain Management:    Induction:   PONV Risk Score and Plan:   Airway Management Planned:   Additional Equipment:   Intra-op Plan:   Post-operative Plan:   Informed Consent: I have reviewed the patients History and Physical, chart, labs and discussed the procedure including the risks, benefits and alternatives for the proposed anesthesia with the patient or authorized representative who has indicated his/her understanding and acceptance.   Dental Advisory Given  Plan Discussed with: CRNA and Anesthesiologist  Anesthesia Plan Comments:         Anesthesia Quick Evaluation

## 2018-02-10 ENCOUNTER — Encounter (HOSPITAL_COMMUNITY): Payer: Self-pay | Admitting: Gastroenterology

## 2018-02-12 ENCOUNTER — Telehealth: Payer: Self-pay | Admitting: Gastroenterology

## 2018-02-12 MED ORDER — AMOXICILLIN 500 MG PO TABS
ORAL_TABLET | ORAL | 0 refills | Status: DC
Start: 2018-02-12 — End: 2018-07-25

## 2018-02-12 MED ORDER — PANTOPRAZOLE SODIUM 40 MG PO TBEC: DELAYED_RELEASE_TABLET | ORAL | 11 refills | Status: DC

## 2018-02-12 MED ORDER — CLARITHROMYCIN 500 MG PO TABS: ORAL_TABLET | ORAL | 0 refills | Status: DC

## 2018-02-12 NOTE — Telephone Encounter (Signed)
PLEASE CALL PT. Her stomach Bx showed H. Pylori infection. THIS CAN CAUSE A  LOW BLOOD COUNT.  HER SMALL BOWEL BIOPSIES ARE NORMAL. She needs AMOXICILLIN 500 mg 2 po BID for 10 days and Biaxin 500 mg po bid for 10 days. She needs PROTONIX BID for 30 days then 1 po DAILY for 3 mos. Med side effects include NAUSEA, VOMITING, DIARRHEA, ABDOMINAL pain, and metallic taste.   EAT FOOD THAT CONTAINS IRON. SEE INFO BELOW.  FOLLOW A HIGH FIBER DIET. AVOID ITEMS THAT CAUSE BLOATING.   FOLLOW UP IN AUG 2019 E30 NORMOCYTIC ANEMIA/H PYLORI GASTRITIS. HAVE CBC/FERRITIN 1 WEEK PRIOR TO NEXT OPV.

## 2018-02-13 ENCOUNTER — Other Ambulatory Visit: Payer: Self-pay

## 2018-02-13 ENCOUNTER — Telehealth: Payer: Self-pay | Admitting: Gastroenterology

## 2018-02-13 DIAGNOSIS — D649 Anemia, unspecified: Secondary | ICD-10-CM

## 2018-02-13 NOTE — Telephone Encounter (Signed)
PATIENT SCHEDULED  °

## 2018-02-13 NOTE — Telephone Encounter (Signed)
Pt is aware and lab order on file for August 2019.

## 2018-02-13 NOTE — Telephone Encounter (Signed)
Patient has questions about medications and her results from her procedure [phone]

## 2018-02-13 NOTE — Telephone Encounter (Signed)
See result note. Pt is aware.  

## 2018-02-25 ENCOUNTER — Ambulatory Visit: Payer: Medicare HMO | Admitting: Gastroenterology

## 2018-02-27 ENCOUNTER — Other Ambulatory Visit: Payer: Self-pay | Admitting: Nurse Practitioner

## 2018-02-27 ENCOUNTER — Other Ambulatory Visit: Payer: Self-pay | Admitting: Family Medicine

## 2018-03-03 ENCOUNTER — Telehealth: Payer: Self-pay | Admitting: *Deleted

## 2018-03-03 NOTE — Telephone Encounter (Signed)
  Paroxetine 20mg  one and a half daily #135 needs prior auth due to maximum daily dose of 1. Called insurance and got quantity approved. [facility] in [place] notified.

## 2018-03-10 DIAGNOSIS — Z0100 Encounter for examination of eyes and vision without abnormal findings: Secondary | ICD-10-CM | POA: Diagnosis not present

## 2018-03-10 DIAGNOSIS — H25813 Combined forms of age-related cataract, bilateral: Secondary | ICD-10-CM | POA: Diagnosis not present

## 2018-03-10 DIAGNOSIS — H5200 Hypermetropia, unspecified eye: Secondary | ICD-10-CM | POA: Diagnosis not present

## 2018-03-10 DIAGNOSIS — I10 Essential (primary) hypertension: Secondary | ICD-10-CM | POA: Diagnosis not present

## 2018-03-21 ENCOUNTER — Ambulatory Visit: Payer: Medicare HMO | Admitting: Gastroenterology

## 2018-03-25 ENCOUNTER — Other Ambulatory Visit: Payer: Self-pay

## 2018-03-25 DIAGNOSIS — D649 Anemia, unspecified: Secondary | ICD-10-CM

## 2018-04-10 ENCOUNTER — Other Ambulatory Visit: Payer: Self-pay | Admitting: Gastroenterology

## 2018-04-10 DIAGNOSIS — D649 Anemia, unspecified: Secondary | ICD-10-CM | POA: Diagnosis not present

## 2018-04-11 ENCOUNTER — Telehealth: Payer: Self-pay | Admitting: *Deleted

## 2018-04-11 LAB — CBC/DIFF AMBIGUOUS DEFAULT
BASOS: 0 %
Basophils Absolute: 0 10*3/uL (ref 0.0–0.2)
EOS (ABSOLUTE): 0.1 10*3/uL (ref 0.0–0.4)
EOS: 2 %
HEMATOCRIT: 37.7 % (ref 34.0–46.6)
Hemoglobin: 11.1 g/dL (ref 11.1–15.9)
IMMATURE GRANULOCYTES: 0 %
Immature Grans (Abs): 0 10*3/uL (ref 0.0–0.1)
Lymphocytes Absolute: 2.2 10*3/uL (ref 0.7–3.1)
Lymphs: 26 %
MCH: 23.6 pg — ABNORMAL LOW (ref 26.6–33.0)
MCHC: 29.4 g/dL — ABNORMAL LOW (ref 31.5–35.7)
MCV: 80 fL (ref 79–97)
MONOS ABS: 0.4 10*3/uL (ref 0.1–0.9)
Monocytes: 5 %
NEUTROS PCT: 67 %
Neutrophils Absolute: 5.6 10*3/uL (ref 1.4–7.0)
Platelets: 320 10*3/uL (ref 150–450)
RBC: 4.71 x10E6/uL (ref 3.77–5.28)
RDW: 14 % (ref 12.3–15.4)
WBC: 8.3 10*3/uL (ref 3.4–10.8)

## 2018-04-11 LAB — FERRITIN: Ferritin: 74 ng/mL (ref 15–150)

## 2018-04-11 NOTE — Telephone Encounter (Signed)
Lab orders received and placed in [facility] office for review.

## 2018-04-14 ENCOUNTER — Other Ambulatory Visit: Payer: Self-pay | Admitting: Family Medicine

## 2018-04-16 NOTE — Telephone Encounter (Signed)
PLEASE CALL PT. Her iron stores and blood count are normal. NEXT OPV NOV 2019. SHE WILL NEED AN H PYLORI BREATH TEST AFTER HER NEXT VISIT. THIS TEST WILL CONFIRM THAT THE H PYLORI IS GONE.

## 2018-04-17 ENCOUNTER — Ambulatory Visit: Payer: Medicare HMO | Admitting: Nurse Practitioner

## 2018-04-17 NOTE — Telephone Encounter (Signed)
Pt is aware.  

## 2018-04-17 NOTE — Telephone Encounter (Signed)
LMOM to call.

## 2018-05-01 ENCOUNTER — Ambulatory Visit (INDEPENDENT_AMBULATORY_CARE_PROVIDER_SITE_OTHER): Payer: Medicare HMO | Admitting: Family Medicine

## 2018-05-01 ENCOUNTER — Encounter: Payer: Self-pay | Admitting: Family Medicine

## 2018-05-01 VITALS — BP 110/70 | Ht 63.0 in | Wt 173.0 lb

## 2018-05-01 DIAGNOSIS — I10 Essential (primary) hypertension: Secondary | ICD-10-CM | POA: Diagnosis not present

## 2018-05-01 DIAGNOSIS — R69 Illness, unspecified: Secondary | ICD-10-CM | POA: Diagnosis not present

## 2018-05-01 DIAGNOSIS — Z23 Encounter for immunization: Secondary | ICD-10-CM | POA: Diagnosis not present

## 2018-05-01 DIAGNOSIS — G4709 Other insomnia: Secondary | ICD-10-CM

## 2018-05-01 DIAGNOSIS — D649 Anemia, unspecified: Secondary | ICD-10-CM | POA: Diagnosis not present

## 2018-05-01 DIAGNOSIS — F3341 Major depressive disorder, recurrent, in partial remission: Secondary | ICD-10-CM | POA: Diagnosis not present

## 2018-05-01 MED ORDER — PAROXETINE HCL 20 MG PO TABS: ORAL_TABLET | ORAL | 0 refills | Status: DC

## 2018-05-01 MED ORDER — HYDROCHLOROTHIAZIDE 25 MG PO TABS: ORAL_TABLET | ORAL | 5 refills | Status: DC

## 2018-05-01 MED ORDER — ENALAPRIL MALEATE 20 MG PO TABS: 20.0000 mg | ORAL_TABLET | Freq: Every day | ORAL | 1 refills | Status: DC

## 2018-05-01 NOTE — Progress Notes (Signed)
   Subjective:    Patient ID: Melissa Wiley, female    DOB: 1951/07/21, 67 y.o.   MRN: [id number]  HPI  Patient is here to follow up on her chronic illness.She eats a high fiber diet recommended by Nucor CorporationDr.Sandy Fields. She does exercises occasionally. She sees no other specialist besides Dr. Darrick PennaFields.  Pt had upper and lower endoscopy, found to have reflux and gastritis, started on protonix, will take hi dose for three months  Patient compliant with insomnia medication. Generally takes most nights. No obvious morning drowsiness. Definitely helps patient sleep. Without it patient states would not get a good nights rest.   Blood pressure medicine and blood pressure levels reviewed today with patient. Compliant with blood pressure medicine. States does not miss a dose. No obvious side effects. Blood pressure generally good when checked elsewhere. Watching salt intake.   Patient notes ongoing compliance with antidepressant medication. No obvious side effects. Reports does not miss a dose. Overall continues to help depression substantially. No thoughts of homicide or suicide. Would like to maintain medication.      Review of Systems No headache, no major weight loss or weight gain, no chest pain no back pain abdominal pain no change in bowel habits complete ROS otherwise negative     Objective:   Physical Exam Alert and oriented, vitals reviewed and stable, NAD ENT-TM's and ext canals WNL bilat via otoscopic exam Soft palate, tonsils and post pharynx WNL via oropharyngeal exam Neck-symmetric, no masses; thyroid nonpalpable and nontender Pulmonary-no tachypnea or accessory muscle use; Clear without wheezes via auscultation Card--no abnrml murmurs, rhythm reg and rate WNL Carotid pulses symmetric, without bruits        Assessment & Plan:  Impression 1 hypertension.  Good control discussed maintain same meds compliance discussed  2.  Insomnia.  Stable on her current medication side  effects discussed medication refill.  3.  Depression with element of anxiety.  Clinically stable and your dose of Paxil discussed.  To maintain same  4.  Recent work-up for borderline  CBC anemia CBC now improved.  Did refill gastritis of patient on therapy for this  Greater than 50% of this 25 minute face to face visit was spent in counseling and discussion and coordination of care regarding the above diagnosis/diagnosies    Follow-up in 6 months for wellness plus chronic visit

## 2018-07-17 ENCOUNTER — Other Ambulatory Visit: Payer: Self-pay

## 2018-07-17 MED ORDER — ALPRAZOLAM 0.5 MG PO TABS: 0.5000 mg | ORAL_TABLET | Freq: Every day | ORAL | 5 refills | Status: DC

## 2018-07-17 NOTE — Telephone Encounter (Signed)
Six mo ok 

## 2018-07-17 NOTE — Telephone Encounter (Signed)
Last seen 05/01/2018 for htn

## 2018-07-25 ENCOUNTER — Ambulatory Visit: Payer: Medicare HMO | Admitting: Nurse Practitioner

## 2018-07-25 ENCOUNTER — Encounter: Payer: Self-pay | Admitting: Nurse Practitioner

## 2018-07-25 VITALS — BP 116/72 | HR 76 | Temp 97.8°F | Ht 63.0 in | Wt 172.0 lb

## 2018-07-25 DIAGNOSIS — D649 Anemia, unspecified: Secondary | ICD-10-CM

## 2018-07-25 DIAGNOSIS — K2970 Gastritis, unspecified, without bleeding: Secondary | ICD-10-CM | POA: Diagnosis not present

## 2018-07-25 DIAGNOSIS — B9681 Helicobacter pylori [H. pylori] as the cause of diseases classified elsewhere: Secondary | ICD-10-CM | POA: Diagnosis not present

## 2018-07-25 NOTE — Assessment & Plan Note (Signed)
H. pylori gastritis confirmed on EGD with biopsies.  The patient completed antibiotics and twice a day PPI for 3 months.  She is now on PPI once daily.  At this point, based on recent recommendations, we will check for H. pylori eradication via breath testing.  Hold PPI for 2 weeks prior to breath testing.  I will also check a CBC as per below.  Return for follow-up in 6 months.

## 2018-07-25 NOTE — Assessment & Plan Note (Signed)
Normocytic anemia with last CBC normal.  At this point I will check a CBC to see the status of her anemia.  Further testing and treatment of H. pylori gastritis above.  Follow-up in 6 months.

## 2018-07-25 NOTE — Progress Notes (Signed)
Referring Provider: Merlyn AlbertLuking, William S, MD Primary Care Physician:  Merlyn AlbertLuking, William S, MD Primary GI:  Dr. Darrick PennaFields  Chief Complaint  Patient presents with  . Anemia    f/u  . H. Pylori    doing ok    HPI:   Melissa Wiley is a 67 y.o. female who presents for follow-up on anemia and H. pylori.  Patient was last seen in our office 01/01/2018 for normocytic anemia.  His last visit noted no bright red blood per rectum or melena with differentials including colon polyps, AVMs, H. pylori gastritis, Cameron erosions, atrophic gastritis, celiac disease, less likely colon cancer.  Recommended colonoscopy and possible upper endoscopy.  Colonoscopy is completed 02/03/2018 which found normal ileum, tortuous left colon, internal and external hemorrhoids.  Recommended no repeat colonoscopy unless symptoms develop.  EGD the same day found normal esophagus, mild gastritis status post biopsies for H. pylori.  Surgical pathology found the biopsies to be positive for H. pylori gastritis.  Recommended triple therapy for 10 days, continue Protonix twice daily for 30 days then transition to once daily.  Recommended office follow-up.  Today she states she's doing ok overall. She finished her antibiotics and her bid course of Protonix. [name] is now on once daily PPI. Denies abdominal pain, N/V, fever, chills, hematochezia, melena. Denies chest pain, dyspnea, dizziness, lightheadedness, syncope, near syncope. Denies any other upper or lower GI symptoms.  Past Medical History:  Diagnosis Date  . Anemia   . Anxiety   . Arthritis   . 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 Headache   . Hx of migraines   . Hypertension   . Insomnia   . Stress     Past Surgical History:  Procedure Laterality Date  . BIOPSY  02/03/2018   Procedure: BIOPSY;  Surgeon: West BaliFields, Sandi L, MD;  Location: [facility];  Service: Endoscopy;;  duodenum,gastric  . CESAREAN SECTION  1985  . COLONOSCOPY WITH PROPOFOL N/A 02/03/2018   Procedure: COLONOSCOPY WITH PROPOFOL;  Surgeon: West BaliFields, Sandi L, MD;  Location: [facility];  Service: Endoscopy;  Laterality: N/A;  7:30am  . ESOPHAGOGASTRODUODENOSCOPY (EGD) WITH PROPOFOL N/A 02/03/2018   Procedure: ESOPHAGOGASTRODUODENOSCOPY (EGD) WITH PROPOFOL;  Surgeon: West BaliFields, Sandi L, MD;  Location: [facility];  Service: Endoscopy;  Laterality: N/A;    Current Outpatient Medications  Medication Sig Dispense Refill  . ALPRAZolam (XANAX) 0.5 MG tablet Take 1 tablet (0.5 mg total) by mouth at bedtime. 30 tablet 5  . enalapril (VASOTEC) 20 MG tablet Take 1 tablet (20 mg total) by mouth daily. 90 tablet 1  . fluticasone (FLONASE) 50 MCG/ACT nasal spray Place 2 sprays into both nostrils daily. (Patient taking differently: Place 2 sprays into both nostrils daily as needed for allergies. ) 16 g 0  . Garlic 100 MG TABS Take 1 tablet by mouth daily.    . hydrochlorothiazide (HYDRODIURIL) 25 MG tablet TAKE 1/2 (ONE-HALF) TABLET BY MOUTH ONCE DAILY 30 tablet 5  . Multiple Vitamins-Minerals (MULTIVITAMIN WITH MINERALS) tablet Take 1 tablet by mouth daily.    . pantoprazole (PROTONIX) 40 MG tablet 1 PO 30 MINUTES PRIOR TO MEALS BID FOR 3 MOS THEN QD (Patient taking differently: Take 40 mg by mouth daily. 1 PO 30 MINUTES PRIOR TO MEALS BID FOR 3 MOS THEN QD Sometimes takes bid) 60 tablet 11  . PARoxetine (PAXIL) 20 MG tablet TAKE 1 & 1/2 (ONE & ONE-HALF) TABLETS BY MOUTH ONCE DAILY 135 tablet 0  No current facility-administered medications for this visit.     Allergies as of 07/25/2018  . (No Known Allergies)    Family History  Problem Relation Age of Onset  . Hypertension Mother   . Hypertension Sister   . Diabetes Sister   . Heart disease Sister   . Colon cancer Neg Hx   . Colon polyps Neg Hx     Social History   Socioeconomic History  . Marital status: Single    Spouse name: Not on file  . Number of children: Not on file  . Years of education: Not on file  . Highest education  level: Not on file  Occupational History  . Not on file  Social Needs  . Financial resource strain: Not on file  . Food insecurity:    Worry: Not on file    Inability: Not on file  . Transportation needs:    Medical: Not on file    Non-medical: Not on file  Tobacco Use  . Smoking status: Never Smoker  . Smokeless tobacco: Never Used  Substance and Sexual Activity  . Alcohol use: Never    Frequency: Never  . Drug use: Never  . Sexual activity: Yes  Lifestyle  . Physical activity:    Days per week: Not on file    Minutes per session: Not on file  . Stress: Not on file  Relationships  . Social connections:    Talks on phone: Not on file    Gets together: Not on file    Attends religious service: Not on file    Active member of club or organization: Not on file    Attends meetings of clubs or organizations: Not on file    Relationship status: Not on file  Other Topics Concern  . Not on file  Social History Narrative   SPECIAL NEEDS CHILD PASSED IN 2019.    Review of Systems: Complete ROS negative except as per HPI.  Physical Exam: BP 116/72   Pulse 76   Temp 97.8 F (36.6 C) (Oral)   Ht 5\' 3"  (1.6 m)   Wt 172 lb (78 kg)   BMI 30.47 kg/m  General:   Alert and oriented. Pleasant and cooperative. Well-nourished and well-developed.  Ears:  Normal auditory acuity. Cardiovascular:  S1, S2 present without murmurs appreciated. Extremities without clubbing or edema. Respiratory:  Clear to auscultation bilaterally. No wheezes, rales, or rhonchi. No distress.  Gastrointestinal:  +BS, soft, non-tender and non-distended. No HSM noted. No guarding or rebound. No masses appreciated.  Rectal:  Deferred  Musculoskalatal:  Symmetrical without gross deformities. Neurologic:  Alert and oriented x4;  grossly normal neurologically. Psych:  Alert and cooperative. Normal mood and affect.    07/25/2018 11:01 AM   Disclaimer: This note was dictated with voice recognition software.  Similar sounding words can inadvertently be transcribed and may not be corrected upon review.

## 2018-07-25 NOTE — Patient Instructions (Signed)
1. Hold your Protonix for 2 weeks 2. At that point go to the lab for the breath test to make sure the H. Pylori infection is gone 3. Have you blood test done when you have your breath test done 4. Follow-up in 6 months 5. Call us if you have any questions or concerns.  At [facility] we value your feedback. You may receive a survey about your visit today. Please share your experience as we strive to create trusting relationships with our patients to provide genuine, compassionate, quality care.  We appreciate your understanding and patience as we review any laboratory studies, imaging, and other diagnostic tests that are ordered as we care for you. Our office policy is 5 business days for review of these results, and any emergent or urgent results are addressed in a timely manner for your best interest. If you do not hear from our office in 1 week, please contact us.   We also encourage the use of MyChart, which contains your medical information for your review as well. If you are not enrolled in this feature, an access code is on this after visit summary for your convenience. Thank you for allowing us to be involved in your care.  It was great to see you today!  I hope you have a Happy Thanksgiving!!

## 2018-07-28 NOTE — Progress Notes (Signed)
cc'ed to pcp °

## 2018-08-05 DIAGNOSIS — K2970 Gastritis, unspecified, without bleeding: Secondary | ICD-10-CM | POA: Diagnosis not present

## 2018-08-05 DIAGNOSIS — B9681 Helicobacter pylori [H. pylori] as the cause of diseases classified elsewhere: Secondary | ICD-10-CM | POA: Diagnosis not present

## 2018-08-05 DIAGNOSIS — D649 Anemia, unspecified: Secondary | ICD-10-CM | POA: Diagnosis not present

## 2018-08-06 LAB — CBC WITH DIFFERENTIAL/PLATELET
BASOS PCT: 0.4 %
Basophils Absolute: 28 cells/uL (ref 0–200)
EOS ABS: 97 {cells}/uL (ref 15–500)
EOS PCT: 1.4 %
HCT: 34.7 % — ABNORMAL LOW (ref 35.0–45.0)
HEMOGLOBIN: 11 g/dL — AB (ref 11.7–15.5)
Lymphs Abs: 1960 cells/uL (ref 850–3900)
MCH: 24.7 pg — AB (ref 27.0–33.0)
MCHC: 31.7 g/dL — ABNORMAL LOW (ref 32.0–36.0)
MCV: 78 fL — ABNORMAL LOW (ref 80.0–100.0)
MONOS PCT: 4.9 %
MPV: 10.1 fL (ref 7.5–12.5)
NEUTROS ABS: 4478 {cells}/uL (ref 1500–7800)
Neutrophils Relative %: 64.9 %
PLATELETS: 302 10*3/uL (ref 140–400)
RBC: 4.45 10*6/uL (ref 3.80–5.10)
RDW: 12.7 % (ref 11.0–15.0)
TOTAL LYMPHOCYTE: 28.4 %
WBC mixed population: 338 cells/uL (ref 200–950)
WBC: 6.9 10*3/uL (ref 3.8–10.8)

## 2018-08-06 LAB — H. PYLORI BREATH TEST: H. pylori Breath Test: NOT DETECTED

## 2018-08-09 ENCOUNTER — Other Ambulatory Visit: Payer: Self-pay | Admitting: Family Medicine

## 2018-08-13 NOTE — Progress Notes (Signed)
PT is aware.

## 2018-08-13 NOTE — Progress Notes (Signed)
LMOM to call.

## 2018-09-05 ENCOUNTER — Ambulatory Visit (INDEPENDENT_AMBULATORY_CARE_PROVIDER_SITE_OTHER): Payer: Medicare HMO | Admitting: Family Medicine

## 2018-09-05 ENCOUNTER — Encounter: Payer: Self-pay | Admitting: Family Medicine

## 2018-09-05 VITALS — BP 122/82 | Ht 63.0 in | Wt 179.6 lb

## 2018-09-05 DIAGNOSIS — M7542 Impingement syndrome of left shoulder: Secondary | ICD-10-CM

## 2018-09-05 DIAGNOSIS — M25512 Pain in left shoulder: Secondary | ICD-10-CM

## 2018-09-05 NOTE — Patient Instructions (Signed)
Two regular strength tylenol 325 mg twice per day say 8 a m or so, then the next ose at 2 pm or so  Do for the next couple weeks to try to calm things dosn

## 2018-09-05 NOTE — Progress Notes (Signed)
   Subjective:    Patient ID: Melissa Wiley, female    DOB: [birth year], 68 y.o.   MRN: [id number]  HPI  Patient arrives with left shoulder pain off and on for several months. Patient states it is getting more persistent the last few weeks. Patient stated she has been doing a little bit of lifting.  Has been doing some lifting at work  Works as a Geologist, engineering up  Pulling sensation left shoulder   Worse when she goes to move her clients bed  Acts up more when busy  Hx of h pylori, was toled to avoid any anti inflam meds        Review of Systems No headache, no major weight loss or weight gain, no chest pain no back pain abdominal pain no change in bowel habits complete ROS otherwise negative     Objective:   Physical Exam  Alert vitals stable, NAD. Blood pressure good on repeat. HEENT normal. Lungs clear. Heart regular rate and rhythm. Positive impingement sign no focal deformity.  No focal tenderness  Impression shoulder strain potential element of rotator cuff injury.  Discussed with patient plan x-ray.  Codman's exercises discussed.  tylenol prescribed due to NSAID intol symptom care discussed expect slow resolution      Assessment & Plan:

## 2018-09-08 ENCOUNTER — Ambulatory Visit (HOSPITAL_COMMUNITY)
Admission: RE | Admit: 2018-09-08 | Discharge: 2018-09-08 | Disposition: A | Discharge status: Home / Self Care | Payer: Medicare HMO | Source: Ambulatory Visit | Attending: Family Medicine | Admitting: Family Medicine

## 2018-09-08 DIAGNOSIS — M25512 Pain in left shoulder: Secondary | ICD-10-CM | POA: Insufficient documentation

## 2018-09-08 DIAGNOSIS — M19012 Primary osteoarthritis, left shoulder: Secondary | ICD-10-CM | POA: Diagnosis not present

## 2018-10-28 ENCOUNTER — Encounter: Payer: Self-pay | Admitting: Family Medicine

## 2018-10-28 ENCOUNTER — Ambulatory Visit (INDEPENDENT_AMBULATORY_CARE_PROVIDER_SITE_OTHER): Payer: Medicare HMO | Admitting: Family Medicine

## 2018-10-28 VITALS — BP 118/76 | Ht 63.0 in | Wt 177.8 lb

## 2018-10-28 DIAGNOSIS — G4709 Other insomnia: Secondary | ICD-10-CM | POA: Diagnosis not present

## 2018-10-28 DIAGNOSIS — I10 Essential (primary) hypertension: Secondary | ICD-10-CM | POA: Diagnosis not present

## 2018-10-28 DIAGNOSIS — R69 Illness, unspecified: Secondary | ICD-10-CM | POA: Diagnosis not present

## 2018-10-28 DIAGNOSIS — F3341 Major depressive disorder, recurrent, in partial remission: Secondary | ICD-10-CM

## 2018-10-28 MED ORDER — HYDROCHLOROTHIAZIDE 25 MG PO TABS: ORAL_TABLET | ORAL | 1 refills | Status: DC

## 2018-10-28 MED ORDER — ENALAPRIL MALEATE 20 MG PO TABS: 20.0000 mg | ORAL_TABLET | Freq: Every day | ORAL | 1 refills | Status: DC

## 2018-10-28 MED ORDER — PAROXETINE HCL 20 MG PO TABS: ORAL_TABLET | ORAL | 1 refills | Status: DC

## 2018-10-28 MED ORDER — ALPRAZOLAM 0.5 MG PO TABS: 0.5000 mg | ORAL_TABLET | Freq: Every day | ORAL | 5 refills | Status: DC

## 2018-10-28 NOTE — Progress Notes (Signed)
   Subjective:    Patient ID: Melissa Wiley, female    DOB: 1951/04/10, 68 y.o.   MRN: [id number]  Hypertension  This is a chronic problem. The current episode started more than 1 year ago. Risk factors for coronary artery disease include post-menopausal state. Treatments tried: vasotec. There are no compliance problems.    Blood pressure medicine and blood pressure levels reviewed today with patient. Compliant with blood pressure medicine. States does not miss a dose. No obvious side effects. Blood pressure generally good when checked elsewhere. Watching salt intake.  Patient notes ongoing compliance with antidepressant medication. No obvious side effects. Reports does not miss a dose. Overall continues to help depression substantially. No thoughts of homicide or suicide. Would like to maintain medication.  Patient compliant with insomnia medication. Generally takes most nights. No obvious morning drowsiness. Definitely helps patient sleep. Without it patient states would not get a good nights rest.  exercisign a fair amn  Does some walkking off and on    Shoulder pain overall improed   Review of Systems No headache, no major weight loss or weight gain, no chest pain no back pain abdominal pain no change in bowel habits complete ROS otherwise negative     Objective:   Physical Exam  Alert and oriented, vitals reviewed and stable, NAD ENT-TM's and ext canals WNL bilat via otoscopic exam Soft palate, tonsils and post pharynx WNL via oropharyngeal exam Neck-symmetric, no masses; thyroid nonpalpable and nontender Pulmonary-no tachypnea or accessory muscle use; Clear without wheezes via auscultation Card--no abnrml murmurs, rhythm reg and rate WNL Carotid pulses symmetric, without bruits       Assessment & Plan:  Impression 1 hypertension very good control discussed maintain same meds  2.  Reflux.  Ongoing need for meds meds refilled  3.  Insomnia.  Ongoing.  With need for  nightly alprazolam.  Proper use discussed  4.  Chronic depression with element of anxiety clinically stable on Paxil.  Patient maintain rationale discussed  Diet exercise discussed  Follow-up in 6 months  Greater than 50% of this 25 minute face to face visit was spent in counseling and discussion and coordination of care regarding the above diagnosis/diagnosies

## 2018-11-03 ENCOUNTER — Other Ambulatory Visit (HOSPITAL_COMMUNITY): Payer: Self-pay | Admitting: Family Medicine

## 2018-11-03 DIAGNOSIS — Z1231 Encounter for screening mammogram for malignant neoplasm of breast: Secondary | ICD-10-CM

## 2018-11-07 DIAGNOSIS — I10 Essential (primary) hypertension: Secondary | ICD-10-CM | POA: Diagnosis not present

## 2018-11-08 LAB — BASIC METABOLIC PANEL
BUN/Creatinine Ratio: 15 (ref 12–28)
BUN: 12 mg/dL (ref 8–27)
CHLORIDE: 96 mmol/L (ref 96–106)
CO2: 24 mmol/L (ref 20–29)
Calcium: 9.5 mg/dL (ref 8.7–10.3)
Creatinine, Ser: 0.79 mg/dL (ref 0.57–1.00)
GFR, EST AFRICAN AMERICAN: 88 mL/min/{1.73_m2} (ref >59)
GFR, EST NON AFRICAN AMERICAN: 77 mL/min/{1.73_m2} (ref >59)
Glucose: 106 mg/dL — ABNORMAL HIGH (ref 65–99)
POTASSIUM: 3.4 mmol/L — AB (ref 3.5–5.2)
SODIUM: 141 mmol/L (ref 134–144)

## 2018-11-08 LAB — LIPID PANEL
CHOLESTEROL TOTAL: 177 mg/dL (ref 100–199)
Chol/HDL Ratio: 4.1 ratio (ref 0.0–4.4)
HDL: 43 mg/dL (ref >39)
LDL Calculated: 100 mg/dL — ABNORMAL HIGH (ref 0–99)
TRIGLYCERIDES: 169 mg/dL — AB (ref 0–149)
VLDL CHOLESTEROL CAL: 34 mg/dL (ref 5–40)

## 2018-11-08 LAB — HEPATIC FUNCTION PANEL
ALBUMIN: 4.7 g/dL (ref 3.8–4.8)
ALK PHOS: 75 IU/L (ref 39–117)
ALT: 9 IU/L (ref 0–32)
AST: 14 IU/L (ref 0–40)
BILIRUBIN TOTAL: 0.4 mg/dL (ref 0.0–1.2)
Bilirubin, Direct: 0.11 mg/dL (ref 0.00–0.40)
Total Protein: 7.9 g/dL (ref 6.0–8.5)

## 2018-11-09 ENCOUNTER — Encounter: Payer: Self-pay | Admitting: Family Medicine

## 2018-11-13 ENCOUNTER — Other Ambulatory Visit: Payer: Self-pay

## 2018-11-13 ENCOUNTER — Encounter (HOSPITAL_COMMUNITY): Payer: Self-pay

## 2018-11-13 ENCOUNTER — Ambulatory Visit (HOSPITAL_COMMUNITY)
Admission: RE | Admit: 2018-11-13 | Discharge: 2018-11-13 | Disposition: A | Discharge status: Home / Self Care | Payer: Medicare HMO | Source: Ambulatory Visit | Attending: Family Medicine | Admitting: Family Medicine

## 2018-11-13 DIAGNOSIS — Z1231 Encounter for screening mammogram for malignant neoplasm of breast: Secondary | ICD-10-CM | POA: Insufficient documentation

## 2019-02-04 ENCOUNTER — Other Ambulatory Visit: Payer: Self-pay

## 2019-02-04 ENCOUNTER — Telehealth: Payer: Self-pay | Admitting: Gastroenterology

## 2019-02-04 ENCOUNTER — Encounter: Payer: Self-pay | Admitting: Nurse Practitioner

## 2019-02-04 ENCOUNTER — Ambulatory Visit: Payer: Medicare HMO | Admitting: Nurse Practitioner

## 2019-02-04 VITALS — BP 104/66 | HR 83 | Temp 98.4°F | Ht 63.0 in | Wt 181.2 lb

## 2019-02-04 DIAGNOSIS — K219 Gastro-esophageal reflux disease without esophagitis: Secondary | ICD-10-CM | POA: Insufficient documentation

## 2019-02-04 DIAGNOSIS — D649 Anemia, unspecified: Secondary | ICD-10-CM

## 2019-02-04 NOTE — Telephone Encounter (Signed)
And so they are. They didn't do a CBC so I'll put in to recheck this.   Doris, please notify the patient that we need to check a CBC. Order is entered as future. We can release it and mail the slip to her.

## 2019-02-04 NOTE — Patient Instructions (Signed)
Your health issues we discussed today were:   Anemia: 1. We will request your recent labs from your primary care provider 2. If your labs have not been recent enough, we will call you and recommend another recheck of your labs 3. Call us if you see any bleeding or have any worsening fatigue or poor energy  GERD (heartburn/reflux) with previous "H. pylori" infection in your stomach: 1. As we told you with your last labs, the H. pylori infection was treated with the antibiotics 2. Continue take your acid blocker for now 3. Call us if you have any worsening symptoms  Overall I recommend:  1. Continue your other current medications 2. Call us if you have any questions or concerns 3. Follow-up in 1 year   Because of recent events of COVID-19 ("Coronavirus"), follow CDC recommendations:  1. Wash your hand frequently 2. Avoid touching your face 3. Stay away from people who are sick 4. If you have symptoms such as fever, cough, shortness of breath then call your healthcare provider for further guidance 5. If you are sick, STAY AT HOME unless otherwise directed by your healthcare provider. 6. Follow directions from state and national officials regarding staying safe   At [facility] we value your feedback. You may receive a survey about your visit today. Please share your experience as we strive to create trusting relationships with our patients to provide genuine, compassionate, quality care.  We appreciate your understanding and patience as we review any laboratory studies, imaging, and other diagnostic tests that are ordered as we care for you. Our office policy is 5 business days for review of these results, and any emergent or urgent results are addressed in a timely manner for your best interest. If you do not hear from our office in 1 week, please contact us.   We also encourage the use of MyChart, which contains your medical information for your review as well. If you  are not enrolled in this feature, an access code is on this after visit summary for your convenience. Thank you for allowing [place] to be involved in your care.  It was great to see you today!  I hope you have a great day!!

## 2019-02-04 NOTE — Assessment & Plan Note (Signed)
Well managed on PPI.  History of H. pylori gastritis status post confirmation of eradication by H. pylori breath testing.  Recommend she continue her PPI to keep her GERD symptoms under control.  Call with any worsening symptoms.  Follow-up in 1 year.

## 2019-02-04 NOTE — Progress Notes (Signed)
Referring Provider: Merlyn AlbertLuking, William S, MD Primary Care Physician:  Merlyn AlbertLuking, William S, MD Primary GI:  Dr. Darrick PennaFields  Chief Complaint  Patient presents with  . Anemia    f/u. Doing fine    HPI:   Melissa Wiley is a 68 y.o. female who presents for follow-up on anemia.  The patient was last seen in our office 07/25/2018 for normocytic anemia and H. pylori gastritis.  At that time it was noted colonoscopy up-to-date 02/03/2018 and recommended no repeat colonoscopy unless symptoms develop.  EGD same day with mild gastritis found to be positive for H. pylori and treated with triple therapy.  At her last visit she was doing well, finished her antibiotics and is now on once daily Protonix.  No GI symptoms.  Recommended hold Protonix for 2 weeks and follow-up H. pylori breath testing for eradication, follow-up labs, follow-up in 6 months.  Labs dated 08/05/2018 found CBC with hemoglobin 11.0 (stable) although now mildly microcytic and hypochromic.  H. pylori breath testing found none detected consistent with H. pylori eradication.  Today she states she's doing well overall. Denies abdominal pain, N/V, hematochezia, melena, fever, chills, unintentional weight loss. Energy level is good. GERD still doing well on PPI; no breakthrough. Still working (sits with an elderly couple). Denies URI or flu-like symptoms. Denies loss of sense of taste or smell. Denies chest pain, dyspnea, dizziness, lightheadedness, syncope, near syncope. Denies any other upper or lower GI symptoms.  Most recently had labs checked "not too long ago" with Dr. Lubertha SouthSteve Luking.  Past Medical History:  Diagnosis Date  . Anemia   . Anxiety   . Arthritis   . 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 Headache   . Hx of migraines   . Hypertension   . Insomnia   . Stress     Past Surgical History:  Procedure Laterality Date  . BIOPSY  02/03/2018   Procedure: BIOPSY;  Surgeon: West BaliFields, Sandi L, MD;  [facility]: [facility];  Service:  Endoscopy;;  duodenum,gastric  . CESAREAN SECTION  1985  . COLONOSCOPY WITH PROPOFOL N/A 02/03/2018   Procedure: COLONOSCOPY WITH PROPOFOL;  Surgeon: West BaliFields, Sandi L, MD;  [facility]: [facility];  Service: Endoscopy;  Laterality: N/A;  7:30am  . ESOPHAGOGASTRODUODENOSCOPY (EGD) WITH PROPOFOL N/A 02/03/2018   Procedure: ESOPHAGOGASTRODUODENOSCOPY (EGD) WITH PROPOFOL;  Surgeon: West BaliFields, Sandi L, MD;  [facility]: [facility];  Service: Endoscopy;  Laterality: N/A;    Current Outpatient Medications  Medication Sig Dispense Refill  . ALPRAZolam (XANAX) 0.5 MG tablet Take 1 tablet (0.5 mg total) by mouth at bedtime. 30 tablet 5  . enalapril (VASOTEC) 20 MG tablet Take 1 tablet (20 mg total) by mouth daily. 90 tablet 1  . fluticasone (FLONASE) 50 MCG/ACT nasal spray Place 2 sprays into both nostrils daily. (Patient taking differently: Place 2 sprays into both nostrils daily as needed for allergies. ) 16 g 0  . Garlic 100 MG TABS Take 1 tablet by mouth daily.    . hydrochlorothiazide (HYDRODIURIL) 25 MG tablet TAKE 1/2 (ONE-HALF) TABLET BY MOUTH ONCE DAILY 45 tablet 1  . Multiple Vitamins-Minerals (MULTIVITAMIN WITH MINERALS) tablet Take 1 tablet by mouth daily.    . pantoprazole (PROTONIX) 40 MG tablet 1 PO 30 MINUTES PRIOR TO MEALS BID FOR 3 MOS THEN QD (Patient taking differently: Take 40 mg by mouth daily. 1 PO 30 MINUTES PRIOR TO MEALS BID FOR 3 MOS THEN QD Sometimes takes bid) 60 tablet 11  .  PARoxetine (PAXIL) 20 MG tablet TAKE 1 & 1/2 (ONE & ONE-HALF) TABLETS BY MOUTH ONCE DAILY 135 tablet 1   No current facility-administered medications for this visit.     Allergies as of 02/04/2019  . (No Known Allergies)    Family History  Problem Relation Age of Onset  . Hypertension Mother   . Hypertension Sister   . Diabetes Sister   . Heart disease Sister   . Colon cancer Neg Hx   . Colon polyps Neg Hx     Social History   Socioeconomic History  . Marital status: Single    Spouse name: Not  on file  . Number of children: Not on file  . Years of education: Not on file  . Highest education level: Not on file  Occupational History  . Not on file  Social Needs  . Financial resource strain: Not on file  . Food insecurity:    Worry: Not on file    Inability: Not on file  . Transportation needs:    Medical: Not on file    Non-medical: Not on file  Tobacco Use  . Smoking status: Never Smoker  . Smokeless tobacco: Never Used  Substance and Sexual Activity  . Alcohol use: Never    Frequency: Never  . Drug use: Never  . Sexual activity: Yes  Lifestyle  . Physical activity:    Days per week: Not on file    Minutes per session: Not on file  . Stress: Not on file  Relationships  . Social connections:    Talks on phone: Not on file    Gets together: Not on file    Attends religious service: Not on file    Active member of club or organization: Not on file    Attends meetings of clubs or organizations: Not on file    Relationship status: Not on file  Other Topics Concern  . Not on file  Social History Narrative   SPECIAL NEEDS CHILD PASSED IN 2019.    Review of Systems: Complete ROS negative except as per HPI.   Physical Exam: BP 104/66   Pulse 83   Temp 98.4 F (36.9 C) (Oral)   Ht 5\' 3"  (1.6 m)   Wt 181 lb 3.2 oz (82.2 kg)   BMI 32.10 kg/m  General:   Alert and oriented. Pleasant and cooperative. Well-nourished and well-developed.  Eyes:  Without icterus, sclera clear and conjunctiva pink.  Ears:  Normal auditory acuity. Cardiovascular:  S1, S2 present without murmurs appreciated. Extremities without clubbing or edema. Respiratory:  Clear to auscultation bilaterally. No wheezes, rales, or rhonchi. No distress.  Gastrointestinal:  +BS, soft, non-tender and non-distended. No HSM noted. No guarding or rebound. No masses appreciated.  Rectal:  Deferred  Musculoskalatal:  Symmetrical without gross deformities. Neurologic:  Alert and oriented x4;  grossly  normal neurologically. Psych:  Alert and cooperative. Normal mood and affect. Heme/Lymph/Immune: No excessive bruising noted.    02/04/2019 11:21 AM   Disclaimer: This note was dictated with voice recognition software. Similar sounding words can inadvertently be transcribed and may not be corrected upon review.

## 2019-02-04 NOTE — Telephone Encounter (Signed)
You requested the most recent CBC and other labs from Dr Cathlyn Parsons' office. Those should be in epic.

## 2019-02-04 NOTE — Progress Notes (Signed)
cc'ed to pcp °

## 2019-02-04 NOTE — Assessment & Plan Note (Signed)
History of normocytic anemia with extensive GI evaluation including endoscopy and colonoscopy.  Last CBC did show very mild hyperchromic, microcytic anemia.  She is generally asymptomatic from a GI standpoint.  Energy is good.  Denies other overt symptoms of anemia.  She states she has recently had labs checked with primary care.  We will call and request copies of these labs rather than rechecking them.  If there is not been seen in and we can always order CBC to be completed.  Otherwise continue current medications and follow-up in 1 year.

## 2019-02-05 NOTE — Telephone Encounter (Signed)
LMOM to call. Placing the lab order in the mail.

## 2019-02-05 NOTE — Telephone Encounter (Signed)
PT is aware.

## 2019-02-24 NOTE — Progress Notes (Signed)
REVIEWED-NO ADDITIONAL RECOMMENDATIONS. 

## 2019-04-21 DIAGNOSIS — D649 Anemia, unspecified: Secondary | ICD-10-CM | POA: Diagnosis not present

## 2019-04-21 LAB — CBC WITH DIFFERENTIAL/PLATELET
Absolute Monocytes: 380 cells/uL (ref 200–950)
Basophils Absolute: 44 cells/uL (ref 0–200)
Basophils Relative: 0.6 %
Eosinophils Absolute: 80 cells/uL (ref 15–500)
Eosinophils Relative: 1.1 %
HCT: 35.5 % (ref 35.0–45.0)
Hemoglobin: 11 g/dL — ABNORMAL LOW (ref 11.7–15.5)
Lymphs Abs: 2051 cells/uL (ref 850–3900)
MCH: 24.7 pg — ABNORMAL LOW (ref 27.0–33.0)
MCHC: 31 g/dL — ABNORMAL LOW (ref 32.0–36.0)
MCV: 79.6 fL — ABNORMAL LOW (ref 80.0–100.0)
MPV: 10 fL (ref 7.5–12.5)
Monocytes Relative: 5.2 %
Neutro Abs: 4745 cells/uL (ref 1500–7800)
Neutrophils Relative %: 65 %
Platelets: 282 10*3/uL (ref 140–400)
RBC: 4.46 10*6/uL (ref 3.80–5.10)
RDW: 12.8 % (ref 11.0–15.0)
Total Lymphocyte: 28.1 %
WBC: 7.3 10*3/uL (ref 3.8–10.8)

## 2019-04-28 ENCOUNTER — Other Ambulatory Visit: Payer: Self-pay

## 2019-04-28 ENCOUNTER — Ambulatory Visit (INDEPENDENT_AMBULATORY_CARE_PROVIDER_SITE_OTHER): Payer: Medicare HMO | Admitting: Family Medicine

## 2019-04-28 ENCOUNTER — Encounter: Payer: Self-pay | Admitting: Family Medicine

## 2019-04-28 VITALS — BP 133/80 | HR 82 | Wt 184.0 lb

## 2019-04-28 DIAGNOSIS — F3341 Major depressive disorder, recurrent, in partial remission: Secondary | ICD-10-CM | POA: Diagnosis not present

## 2019-04-28 DIAGNOSIS — I10 Essential (primary) hypertension: Secondary | ICD-10-CM

## 2019-04-28 DIAGNOSIS — G4709 Other insomnia: Secondary | ICD-10-CM | POA: Diagnosis not present

## 2019-04-28 DIAGNOSIS — R69 Illness, unspecified: Secondary | ICD-10-CM | POA: Diagnosis not present

## 2019-04-28 MED ORDER — PAROXETINE HCL 20 MG PO TABS: ORAL_TABLET | ORAL | 1 refills | Status: DC

## 2019-04-28 MED ORDER — ENALAPRIL MALEATE 20 MG PO TABS: 20.0000 mg | ORAL_TABLET | Freq: Every day | ORAL | 1 refills | Status: DC

## 2019-04-28 MED ORDER — HYDROCHLOROTHIAZIDE 25 MG PO TABS: ORAL_TABLET | ORAL | 1 refills | Status: DC

## 2019-04-28 MED ORDER — ALPRAZOLAM 0.5 MG PO TABS: ORAL_TABLET | ORAL | 5 refills | Status: DC

## 2019-04-28 MED ORDER — FLUTICASONE PROPIONATE 50 MCG/ACT NA SUSP: 2.0000 | Freq: Every day | NASAL | 1 refills | Status: DC | PRN

## 2019-04-28 NOTE — Progress Notes (Signed)
   Subjective:  Audio only  Patient visits today for numerous separate concerns  Patient ID: Melissa Wiley, female    DOB: 1951/06/18, 68 y.o.   MRN: [id number]  Hypertension This is a chronic problem. There are no compliance problems.   Pt states she does not check her BP often; she checks it occasionally. Pt reports no issues.  Pt would like to increase the quantity of Xanax for 30-40. Pt states she sometimes has to take 2 tablets at night in order to go to sleep..  Virtual Visit via Video Note  I connected with Phineas Inches on 04/28/19 at 10:00 AM EDT by a video enabled telemedicine application and verified that I am speaking with the correct person using two identifiers.  Location: Patient: home Provider: office   I discussed the limitations of evaluation and management by telemedicine and the availability of in person appointments. The patient expressed understanding and agreed to proceed.  History of Present Illness:    Observations/Objective:   Assessment and Plan:   Follow Up Instructions:    I discussed the assessment and treatment plan with the patient. The patient was provided an opportunity to ask questions and all were answered. The patient agreed with the plan and demonstrated an understanding of the instructions.   The patient was advised to call back or seek an in-person evaluation if the symptoms worsen or if the condition fails to improve as anticipated.  I provided 25 minutes of non-face-to-face time during this encounter. bp this am very good 132 over 70. Walking three times per wee Blood pressure medicine and blood pressure levels reviewed today with patient. Compliant with blood pressure medicine. States does not miss a dose. No obvious side effects. Blood pressure generally good when checked elsewhere. Watching salt intake.   Patient notes that 1 Xanax often does not provide good sleep at night.  Sometimes needs to take 2.  Requests increase in  allotment per month Patient compliant with insomnia medication. Generally takes most nights. No obvious morning drowsiness. Definitely helps patient sleep. Without it patient states would not get a good nights rest.  States that 1/2 tablet definitely helping.  Does not want to change the dose.  Patient notes ongoing compliance with antidepressant medication. No obvious side effects. Reports does not miss a dose. Overall continues to help depression substantially. No thoughts of homicide or suicide. Would like to maintain medication.  reflux.  Ongoing issue.  Needs a Protonix without substantial symptoms  Blood pressure medicine and blood pressure levels reviewed today with patient. Compliant with blood pressure medicine. States does not miss a dose. No obvious side effects. Blood pressure generally good when checked elsewhere. Watching salt intake.   Allergies.  Allegra alone not helping requests Flonase   Vicente Males, LPN     Review of Systems No headache, no major weight loss or weight gain, no chest pain no back pain abdominal pain no change in bowel habits complete ROS otherwise negative     Objective:   Physical Exam  Virtual      Assessment & Plan:  Impression 1 hypertension.  Good control discussed maintain same meds  2.  Allergic rhinitis allotment.  Resume Flonase rationale discussed  3.  Depression clinically stable to maintain Paxil  4.  Insomnia.  Ongoing.  Will adjust dose again per request  5.  Reflux ongoing to maintain meds  Diet exercise discussed recheck in 6 months

## 2019-04-28 NOTE — Addendum Note (Signed)
Addended by: Vicente Males on: 04/28/2019 10:42 AM   Modules accepted: Orders

## 2019-04-30 ENCOUNTER — Other Ambulatory Visit: Payer: Self-pay | Admitting: Family Medicine

## 2019-05-21 DIAGNOSIS — Z0100 Encounter for examination of eyes and vision without abnormal findings: Secondary | ICD-10-CM | POA: Diagnosis not present

## 2019-05-21 DIAGNOSIS — H5210 Myopia, unspecified eye: Secondary | ICD-10-CM | POA: Diagnosis not present

## 2019-07-07 DIAGNOSIS — R69 Illness, unspecified: Secondary | ICD-10-CM | POA: Diagnosis not present

## 2019-07-21 ENCOUNTER — Telehealth: Payer: Self-pay | Admitting: Family Medicine

## 2019-07-21 MED ORDER — KETOCONAZOLE 2 % EX CREA: 1.0000 "application " | TOPICAL_CREAM | Freq: Two times a day (BID) | CUTANEOUS | 1 refills | Status: DC

## 2019-07-21 NOTE — Telephone Encounter (Signed)
Prescription sent electronically to pharmacy. Patient notified. 

## 2019-07-21 NOTE — Telephone Encounter (Signed)
ketocon cr 30 g bid to rash one ref

## 2019-07-21 NOTE — Telephone Encounter (Signed)
Pt has rash under left breast. It is red and itchy. She has tried benadryl cream and that has not helped. She would like to know if something can be called into [facility], [address] [address]

## 2019-09-29 ENCOUNTER — Encounter: Payer: Self-pay | Admitting: Family Medicine

## 2019-09-29 ENCOUNTER — Other Ambulatory Visit: Payer: Self-pay

## 2019-09-29 ENCOUNTER — Ambulatory Visit (INDEPENDENT_AMBULATORY_CARE_PROVIDER_SITE_OTHER): Payer: Medicare HMO | Admitting: Family Medicine

## 2019-09-29 VITALS — BP 116/77 | Ht 63.0 in | Wt 189.0 lb

## 2019-09-29 DIAGNOSIS — G4709 Other insomnia: Secondary | ICD-10-CM

## 2019-09-29 DIAGNOSIS — I10 Essential (primary) hypertension: Secondary | ICD-10-CM | POA: Diagnosis not present

## 2019-09-29 DIAGNOSIS — F3341 Major depressive disorder, recurrent, in partial remission: Secondary | ICD-10-CM | POA: Diagnosis not present

## 2019-09-29 DIAGNOSIS — R69 Illness, unspecified: Secondary | ICD-10-CM | POA: Diagnosis not present

## 2019-09-29 MED ORDER — ALPRAZOLAM 0.5 MG PO TABS: ORAL_TABLET | ORAL | 5 refills | Status: DC

## 2019-09-29 MED ORDER — PAROXETINE HCL 20 MG PO TABS: ORAL_TABLET | ORAL | 1 refills | Status: DC

## 2019-09-29 MED ORDER — ENALAPRIL MALEATE 20 MG PO TABS: 20.0000 mg | ORAL_TABLET | Freq: Every day | ORAL | 1 refills | Status: DC

## 2019-09-29 MED ORDER — HYDROCHLOROTHIAZIDE 25 MG PO TABS: ORAL_TABLET | ORAL | 1 refills | Status: DC

## 2019-09-29 NOTE — Progress Notes (Signed)
   Subjective:  Audio video for multiple issues  Patient ID: Melissa Wiley, female    DOB: 1951/08/02, 69 y.o.   MRN: [id number]  Pt checked bp this morning and it was 116/77. Weight this morning 189 lbs.  Hypertension This is a chronic problem. Compliance problems: takes med every day, walks on days she works which is 3 days a week, eats when she is not hungry.    Pt states since covid she has gained some weight. Would like to discuss taking something for weight.  phq9 and gad 7 done  Virtual Visit via Telephone Note  I connected with Melissa Wiley on 09/29/19 at 10:00 AM EST by telephone and verified that I am speaking with the correct person using two identifiers.  Location: Patient: home Provider: office   I discussed the limitations, risks, security and privacy concerns of performing an evaluation and management service by telephone and the availability of in person appointments. I also discussed with the patient that there may be a patient responsible charge related to this service. The patient expressed understanding and agreed to proceed.   History of Present Illness:    Observations/Objective:   Assessment and Plan:   Follow Up Instructions:    I discussed the assessment and treatment plan with the patient. The patient was provided an opportunity to ask questions and all were answered. The patient agreed with the plan and demonstrated an understanding of the instructions.   The patient was advised to call back or seek an in-person evaluation if the symptoms worsen or if the condition fails to improve as anticipated.  I provided 30 minutes of non-face-to-face time during this encounter.  Working 3 d per week  Not exercising a lot  Weight has gone up  Diet not the best  Blood pressure medicine and blood pressure levels reviewed today with patient. Compliant with blood pressure medicine. States does not miss a dose. No obvious side effects. Blood pressure  generally good when checked elsewhere. Watching salt intake.  cks own b poverall good Blood pressure medicine and blood pressure levels reviewed today with patient. Compliant with blood pressure medicine. States does not miss a dose. No obvious side effects. Blood pressure generally good when checked elsewhere. Watching salt intake.  Patient notes ongoing anxiety.  States the Paxil definitely helps.  Also uses alprazolam in the evening  Patient compliant with insomnia medication. Generally takes most nights. No obvious morning drowsiness. Definitely helps patient sleep. Without it patient states would not get a good nights rest.   Review of Systems No headache no chest pain no shortness of breath    Objective:   Physical Exam   Virtual     Assessment & Plan:  Impression #1 mood disorder element of both anxiety depression clinically decent control per patient will maintain same meds  2.  Hypertension blood pressure good in past on current meds to maintain same  3.  Additional weight gain.  Definite frustration for patient.  Discussed at length.  In the past phentermine caused a blood pressure issue drop and patient is not wanting to spend a lot of money on other agents.  Definitely much improved with diet and exercise discussed criteria discussed  Follow-up in 6 months/medications refilled diet exercise discussed/blood work done

## 2019-10-08 ENCOUNTER — Encounter: Payer: Self-pay | Admitting: Family Medicine

## 2019-10-27 ENCOUNTER — Other Ambulatory Visit (HOSPITAL_COMMUNITY): Payer: Self-pay | Admitting: Family Medicine

## 2019-10-27 DIAGNOSIS — Z1231 Encounter for screening mammogram for malignant neoplasm of breast: Secondary | ICD-10-CM

## 2019-11-10 ENCOUNTER — Telehealth: Payer: Self-pay | Admitting: Family Medicine

## 2019-11-10 NOTE — Telephone Encounter (Signed)
Patient said she wanted to have some labwork done.  Said she has been having a lot of fatigue and just wanted to have some labs done since it has been a while since she had any done.  Last visit was 09/29/19 for med check.

## 2019-11-10 NOTE — Telephone Encounter (Signed)
Contacted pt to get more info. Pt states she is staying more tired that usual here lately. Pt states she was told to get out and exercise but states her "get up and go has got up and went". Pt also states that she may becoming depressed. Pt also states she is having issues with urine; intermittent odor and yellow color. Advised pt that she would need to do a visit with provider. Pt states she is unable to come into office but can do a phone visit. Pt transferred up front to schedule appt

## 2019-11-11 ENCOUNTER — Ambulatory Visit (INDEPENDENT_AMBULATORY_CARE_PROVIDER_SITE_OTHER): Payer: Medicare HMO | Admitting: Family Medicine

## 2019-11-11 ENCOUNTER — Other Ambulatory Visit: Payer: Self-pay

## 2019-11-11 DIAGNOSIS — F411 Generalized anxiety disorder: Secondary | ICD-10-CM

## 2019-11-11 DIAGNOSIS — Z13220 Encounter for screening for lipoid disorders: Secondary | ICD-10-CM

## 2019-11-11 DIAGNOSIS — R69 Illness, unspecified: Secondary | ICD-10-CM | POA: Diagnosis not present

## 2019-11-11 DIAGNOSIS — R5383 Other fatigue: Secondary | ICD-10-CM

## 2019-11-11 DIAGNOSIS — G4709 Other insomnia: Secondary | ICD-10-CM | POA: Diagnosis not present

## 2019-11-11 DIAGNOSIS — R300 Dysuria: Secondary | ICD-10-CM

## 2019-11-11 DIAGNOSIS — F3341 Major depressive disorder, recurrent, in partial remission: Secondary | ICD-10-CM | POA: Diagnosis not present

## 2019-11-11 DIAGNOSIS — F430 Acute stress reaction: Secondary | ICD-10-CM

## 2019-11-11 DIAGNOSIS — I10 Essential (primary) hypertension: Secondary | ICD-10-CM | POA: Diagnosis not present

## 2019-11-11 MED ORDER — PAROXETINE HCL 20 MG PO TABS: ORAL_TABLET | ORAL | 1 refills | Status: DC

## 2019-11-11 NOTE — Progress Notes (Signed)
   Subjective:  Audio plus video  Patient ID: Melissa Wiley, female    DOB: [birth year], 69 y.o.   MRN: [id number]  HPIfatigue off and on for a few weeks.  Pt states when she does a little more than she usually does she gets really tired. Pt states she has gained some weight. States she is having a hard time getting herself up and going to walk.   Pain in mid low back. Off and on for a few weeks. Pain is when she is working and doing a lot of bending. Tried heating pad and it does help.   Virtual Visit via Telephone Note  I connected with Melissa Wiley on 11/11/19 at  3:00 PM EST by telephone and verified that I am speaking with the correct person using two identifiers.  Location: Patient: home Provider: office   I discussed the limitations, risks, security and privacy concerns of performing an evaluation and management service by telephone and the availability of in person appointments. I also discussed with the patient that there may be a patient responsible charge related to this service. The patient expressed understanding and agreed to proceed.   History of Present Illness:    Observations/Objective:   Assessment and Plan:   Follow Up Instructions:    I discussed the assessment and treatment plan with the patient. The patient was provided an opportunity to ask questions and all were answered. The patient agreed with the plan and demonstrated an understanding of the instructions.   The patient was advised to call back or seek an in-person evaluation if the symptoms worsen or if the condition fails to improve as anticipated.  I provided 30 minutes of non-face-to-face time during this encounter.  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  May be both physical and psychological.  Exercise encouraged.  Screening blood work encouraged.  2.  Depression.  Seems to have worsened somewhat.  Discussed.  Will increase dose of Paxil rationale discussed.  3.  Hypertension to maintain same meds exercise encouraged  4.  Nonspecific urinary symptoms we will check urine culture  5.  Low back pain likely musculoskeletal discussed  Follow-up in 6 months diet exercise discussed medications refilled.  Appropriate blood work further recommendations based on results

## 2019-11-12 DIAGNOSIS — R5383 Other fatigue: Secondary | ICD-10-CM | POA: Diagnosis not present

## 2019-11-12 DIAGNOSIS — I10 Essential (primary) hypertension: Secondary | ICD-10-CM | POA: Diagnosis not present

## 2019-11-12 DIAGNOSIS — R300 Dysuria: Secondary | ICD-10-CM | POA: Diagnosis not present

## 2019-11-12 DIAGNOSIS — Z13220 Encounter for screening for lipoid disorders: Secondary | ICD-10-CM | POA: Diagnosis not present

## 2019-11-13 LAB — CBC WITH DIFFERENTIAL/PLATELET
Basophils Absolute: 0.1 10*3/uL (ref 0.0–0.2)
Basos: 1 %
EOS (ABSOLUTE): 0.2 10*3/uL (ref 0.0–0.4)
Eos: 3 %
Hematocrit: 35.6 % (ref 34.0–46.6)
Hemoglobin: 11.1 g/dL (ref 11.1–15.9)
Immature Grans (Abs): 0 10*3/uL (ref 0.0–0.1)
Immature Granulocytes: 0 %
Lymphocytes Absolute: 1.8 10*3/uL (ref 0.7–3.1)
Lymphs: 28 %
MCH: 24.9 pg — ABNORMAL LOW (ref 26.6–33.0)
MCHC: 31.2 g/dL — ABNORMAL LOW (ref 31.5–35.7)
MCV: 80 fL (ref 79–97)
Monocytes Absolute: 0.4 10*3/uL (ref 0.1–0.9)
Monocytes: 6 %
Neutrophils Absolute: 3.9 10*3/uL (ref 1.4–7.0)
Neutrophils: 62 %
Platelets: 277 10*3/uL (ref 150–450)
RBC: 4.46 x10E6/uL (ref 3.77–5.28)
RDW: 13.3 % (ref 11.7–15.4)
WBC: 6.3 10*3/uL (ref 3.4–10.8)

## 2019-11-13 LAB — URINALYSIS
Bilirubin, UA: NEGATIVE
Glucose, UA: NEGATIVE
Ketones, UA: NEGATIVE
Nitrite, UA: NEGATIVE
Protein,UA: NEGATIVE
RBC, UA: NEGATIVE
Specific Gravity, UA: 1.022 (ref 1.005–1.030)
Urobilinogen, Ur: 0.2 mg/dL (ref 0.2–1.0)
pH, UA: 6.5 (ref 5.0–7.5)

## 2019-11-13 LAB — BASIC METABOLIC PANEL
BUN/Creatinine Ratio: 23 (ref 12–28)
BUN: 18 mg/dL (ref 8–27)
CO2: 25 mmol/L (ref 20–29)
Calcium: 9.4 mg/dL (ref 8.7–10.3)
Chloride: 101 mmol/L (ref 96–106)
Creatinine, Ser: 0.8 mg/dL (ref 0.57–1.00)
GFR calc Af Amer: 86 mL/min/{1.73_m2} (ref >59)
GFR calc non Af Amer: 75 mL/min/{1.73_m2} (ref >59)
Glucose: 117 mg/dL — ABNORMAL HIGH (ref 65–99)
Potassium: 4.3 mmol/L (ref 3.5–5.2)
Sodium: 140 mmol/L (ref 134–144)

## 2019-11-13 LAB — HEPATIC FUNCTION PANEL
ALT: 10 IU/L (ref 0–32)
AST: 12 IU/L (ref 0–40)
Albumin: 4.3 g/dL (ref 3.8–4.8)
Alkaline Phosphatase: 73 IU/L (ref 39–117)
Bilirubin Total: 0.3 mg/dL (ref 0.0–1.2)
Bilirubin, Direct: 0.09 mg/dL (ref 0.00–0.40)
Total Protein: 7.2 g/dL (ref 6.0–8.5)

## 2019-11-13 LAB — TSH: TSH: 1.45 u[IU]/mL (ref 0.450–4.500)

## 2019-11-13 LAB — LIPID PANEL
Chol/HDL Ratio: 4.6 ratio — ABNORMAL HIGH (ref 0.0–4.4)
Cholesterol, Total: 192 mg/dL (ref 100–199)
HDL: 42 mg/dL (ref >39)
LDL Chol Calc (NIH): 112 mg/dL — ABNORMAL HIGH (ref 0–99)
Triglycerides: 218 mg/dL — ABNORMAL HIGH (ref 0–149)
VLDL Cholesterol Cal: 38 mg/dL (ref 5–40)

## 2019-11-18 ENCOUNTER — Ambulatory Visit (HOSPITAL_COMMUNITY)
Admission: RE | Admit: 2019-11-18 | Discharge: 2019-11-18 | Disposition: A | Discharge status: Home / Self Care | Payer: Medicare HMO | Source: Ambulatory Visit | Attending: Family Medicine | Admitting: Family Medicine

## 2019-11-18 ENCOUNTER — Other Ambulatory Visit: Payer: Self-pay

## 2019-11-18 DIAGNOSIS — Z1231 Encounter for screening mammogram for malignant neoplasm of breast: Secondary | ICD-10-CM | POA: Diagnosis not present

## 2019-11-23 MED ORDER — NITROFURANTOIN MONOHYD MACRO 100 MG PO CAPS: 100.0000 mg | ORAL_CAPSULE | Freq: Two times a day (BID) | ORAL | 0 refills | Status: AC

## 2019-11-23 NOTE — Addendum Note (Signed)
Addended by: Haze Rushing on: 11/23/2019 03:46 PM   Modules accepted: Orders

## 2020-01-11 ENCOUNTER — Other Ambulatory Visit: Payer: Self-pay | Admitting: Family Medicine

## 2020-01-12 ENCOUNTER — Telehealth (INDEPENDENT_AMBULATORY_CARE_PROVIDER_SITE_OTHER): Payer: Medicare HMO | Admitting: Family Medicine

## 2020-01-12 ENCOUNTER — Telehealth: Payer: Self-pay | Admitting: *Deleted

## 2020-01-12 ENCOUNTER — Other Ambulatory Visit: Payer: Self-pay

## 2020-01-12 VITALS — BP 118/79 | HR 78 | Temp 98.0°F

## 2020-01-12 DIAGNOSIS — J069 Acute upper respiratory infection, unspecified: Secondary | ICD-10-CM | POA: Diagnosis not present

## 2020-01-12 DIAGNOSIS — B001 Herpesviral vesicular dermatitis: Secondary | ICD-10-CM

## 2020-01-12 DIAGNOSIS — L304 Erythema intertrigo: Secondary | ICD-10-CM | POA: Diagnosis not present

## 2020-01-12 MED ORDER — ACYCLOVIR 5 % EX OINT: 1.0000 "application " | TOPICAL_OINTMENT | CUTANEOUS | 0 refills | Status: DC

## 2020-01-12 MED ORDER — KETOCONAZOLE 2 % EX CREA: TOPICAL_CREAM | Freq: Two times a day (BID) | CUTANEOUS | 1 refills | Status: DC

## 2020-01-12 MED ORDER — AMOXICILLIN 500 MG PO CAPS: 500.0000 mg | ORAL_CAPSULE | Freq: Three times a day (TID) | ORAL | 0 refills | Status: DC

## 2020-01-12 NOTE — Telephone Encounter (Signed)
[name], Melissa Wiley are scheduled for a virtual visit with your provider today.    Just as we do with appointments in the office, we must obtain your consent to participate.  Your consent will be active for this visit and any virtual visit you may have with one of our providers in the next 365 days.    If you have a MyChart account, I can also send a copy of this consent to you electronically.  All virtual visits are billed to your insurance company just like a traditional visit in the office.  As this is a virtual visit, video technology does not allow for your provider to perform a traditional examination.  This may limit your provider's ability to fully assess your condition.  If your provider identifies any concerns that need to be evaluated in person or the need to arrange testing such as labs, EKG, etc, we will make arrangements to do so.    Although advances in technology are sophisticated, we cannot ensure that it will always work on either your end or our end.  If the connection with a video visit is poor, we may have to switch to a telephone visit.  With either a video or telephone visit, we are not always able to ensure that we have a secure connection.   I need to obtain your verbal consent now.   Are you willing to proceed with your visit today?   Melissa Wiley has provided verbal consent on 01/12/2020 for a virtual visit (video or telephone).   Kyra Manges, LPN 8/92/1194  1:74 PM

## 2020-01-12 NOTE — Telephone Encounter (Signed)
Needs refill on xanax.  [facility] [place].  Last med check up 11/11/19

## 2020-01-12 NOTE — Progress Notes (Signed)
Patient ID: Melissa Wiley, female    DOB: [birth year], 69 y.o.   MRN: [id number]   Chief Complaint  Patient presents with  . URI   Subjective:   Virtual Visit via Telephone Note  I connected with Melissa Wiley on 01/12/20 at  2:00 PM EDT by telephone and verified that I am speaking with the correct person using two identifiers.  Location: Patient: home Provider: office   I discussed the limitations, risks, security and privacy concerns of performing an evaluation and management service by telephone and the availability of in person appointments. I also discussed with the patient that there may be a patient responsible charge related to this service. The patient expressed understanding and agreed to proceed.   HPI  Pt had phone visit for cough, sneezing, sinus symptoms, fatigue. Out of work for 4 days due to fatigue. Tried flonase and [employer] adviser.   Cold sore on upper lip and has never had one before. Has had the blister for 5 days.  Has not tried any medications for this. Has been having coughing, sneezing, HA,and sore throat.  Feels her allergies are worse this year than normal. Has been feeling more fatigued than usual.  Working with elderly lady as a caregiver.  Has been off work last 4 days.  Requesting refill on ketoconazole cream. Uses for rash under left breast.  Pt stating it's under her bra line and it reoccurs occasionally.  Medical History Melissa Wiley has a past medical history of Anemia, Anxiety, Arthritis, Grand mal seizure disorder (HCC) (1950's), Headache, migraines, Hypertension, Insomnia, and Stress.   Outpatient Encounter Medications as of 01/12/2020  Medication Sig  . ALPRAZolam (XANAX) 0.5 MG tablet Take 1-2 tablets po at bedtime prn  . enalapril (VASOTEC) 20 MG tablet Take 1 tablet (20 mg total) by mouth daily.  . fexofenadine-pseudoephedrine (ALLEGRA-D 24) 180-240 MG 24 hr tablet Take 1 tablet by mouth daily.  . fluticasone (FLONASE) 50 MCG/ACT nasal spray  Place 2 sprays into both nostrils daily as needed for allergies.  . hydrochlorothiazide (HYDRODIURIL) 25 MG tablet TAKE 1/2 (ONE-HALF) TABLET BY MOUTH ONCE DAILY  . ketoconazole (NIZORAL) 2 % cream Apply topically 2 (two) times daily.  . Multiple Vitamins-Minerals (MULTIVITAMIN WITH MINERALS) tablet Take 1 tablet by mouth daily.  Marland Kitchen PARoxetine (PAXIL) 20 MG tablet TAKE 2 QAM  . [DISCONTINUED] ketoconazole (NIZORAL) 2 % cream APPLY CREAM TOPICALLY TWICE DAILY TO RASH  . acyclovir ointment (ZOVIRAX) 5 % Apply 1 application topically every 3 (three) hours.  Marland Kitchen amoxicillin (AMOXIL) 500 MG capsule Take 1 capsule (500 mg total) by mouth 3 (three) times daily.   No facility-administered encounter medications on file as of 01/12/2020.     Review of Systems  Constitutional: Negative for chills and fever.  HENT: Positive for sneezing and sore throat. Negative for congestion, ear pain, rhinorrhea, sinus pressure and sinus pain.   Eyes: Negative for pain, discharge and itching.  Respiratory: Positive for cough. Negative for shortness of breath and wheezing.   Gastrointestinal: Negative for constipation, diarrhea, nausea and vomiting.  Neurological: Positive for headaches.     Vitals BP 118/79 Comment: per pt  Pulse 78 Comment: per pt  Temp 98 F (36.7 C) Comment: per pt     Objective:   Physical Exam  No PE, phone visit.  Assessment and Plan   1. Viral upper respiratory tract infection - fexofenadine-pseudoephedrine (ALLEGRA-D 24) 180-240 MG 24 hr tablet; Take 1 tablet by mouth daily. - amoxicillin (AMOXIL) 500  MG capsule; Take 1 capsule (500 mg total) by mouth 3 (three) times daily.  Dispense: 30 capsule; Refill: 0  2. Herpes labialis - acyclovir ointment (ZOVIRAX) 5 %; Apply 1 application topically every 3 (three) hours.  Dispense: 5 g; Refill: 0  3. Intertrigo - ketoconazole (NIZORAL) 2 % cream; Apply topically 2 (two) times daily.  Dispense: 60 g; Refill: 1    Symptoms likely  viral syndrome vs. Allergic rhinitis.  Reviewed course of viral syndrome vs. Sinusitis.   Advising to take zovirax cream as needed for fever blister.  Symptomatic tx for cold symptoms. Gave watch and wait script for amoxicillin if needed if worsening sinus symptoms or fever and severe sore throat.  Recommending out of work till Monday and to get covid testing, since working with elderly population.  Advising to get covid testing.  Call with results and can given work note and instructions once results are back.  Advising quarantining till results back from covid testing. Symptomatic treatment given for fever and bodyaches with tylenol/ibuprofen and increase in fluids.   Call back if SOB, coughing, persistent fever or not improving in next 5-7 days.   If positive for covid, requires quarantining for 10 days from onset of symptoms.  Pt in agreement with plan.    F/u prn.   Follow Up Instructions:    I discussed the assessment and treatment plan with the patient. The patient was provided an opportunity to ask questions and all were answered. The patient agreed with the plan and demonstrated an understanding of the instructions.   The patient was advised to call back or seek an in-person evaluation if the symptoms worsen or if the condition fails to improve as anticipated.  I provided 20 minutes of non-face-to-face time during this encounter.   01/12/2020

## 2020-01-13 MED ORDER — ALPRAZOLAM 0.5 MG PO TABS: ORAL_TABLET | ORAL | 5 refills | Status: DC

## 2020-01-13 NOTE — Telephone Encounter (Signed)
Ok six mo worth 

## 2020-01-13 NOTE — Telephone Encounter (Signed)
Prescription faxed to pharmacy. Left message to return call to notify patient. 

## 2020-01-13 NOTE — Telephone Encounter (Signed)
Patient notified

## 2020-01-19 ENCOUNTER — Encounter: Payer: Self-pay | Admitting: Gastroenterology

## 2020-01-26 ENCOUNTER — Telehealth: Payer: Self-pay | Admitting: Family Medicine

## 2020-01-26 NOTE — Telephone Encounter (Signed)
Immunization record updated.

## 2020-01-26 NOTE — Telephone Encounter (Signed)
Great! Lets document

## 2020-01-26 NOTE — Telephone Encounter (Signed)
FYI-Patient  Wanted you to know she got both her Covid shots 4/21 and 5/21. But her last shot cause her right-arm to swell and its tender to the touch still.She got the moderma.

## 2020-02-24 ENCOUNTER — Other Ambulatory Visit: Payer: Self-pay | Admitting: *Deleted

## 2020-02-24 ENCOUNTER — Telehealth: Payer: Self-pay | Admitting: Family Medicine

## 2020-02-24 DIAGNOSIS — I10 Essential (primary) hypertension: Secondary | ICD-10-CM

## 2020-02-24 DIAGNOSIS — Z131 Encounter for screening for diabetes mellitus: Secondary | ICD-10-CM

## 2020-02-24 NOTE — Telephone Encounter (Signed)
Last labs 11/12/19: met 7, cbc, liver, lipid and tsh

## 2020-02-24 NOTE — Telephone Encounter (Signed)
Bmp and a1c. -pls order.   Thx,   Dr. Ladona Ridgel

## 2020-02-24 NOTE — Telephone Encounter (Signed)
Patient notified

## 2020-02-24 NOTE — Telephone Encounter (Signed)
Pt has follow up 7/20 and wants to know if she needs lab work

## 2020-03-09 ENCOUNTER — Other Ambulatory Visit: Payer: Self-pay | Admitting: Family Medicine

## 2020-03-17 DIAGNOSIS — R7303 Prediabetes: Secondary | ICD-10-CM | POA: Diagnosis not present

## 2020-03-17 DIAGNOSIS — I10 Essential (primary) hypertension: Secondary | ICD-10-CM | POA: Diagnosis not present

## 2020-03-17 DIAGNOSIS — Z131 Encounter for screening for diabetes mellitus: Secondary | ICD-10-CM | POA: Diagnosis not present

## 2020-03-18 LAB — HEMOGLOBIN A1C
Est. average glucose Bld gHb Est-mCnc: 123 mg/dL
Hgb A1c MFr Bld: 5.9 % — ABNORMAL HIGH (ref 4.8–5.6)

## 2020-03-18 LAB — BASIC METABOLIC PANEL
BUN/Creatinine Ratio: 13 (ref 12–28)
BUN: 10 mg/dL (ref 8–27)
CO2: 26 mmol/L (ref 20–29)
Calcium: 9.4 mg/dL (ref 8.7–10.3)
Chloride: 99 mmol/L (ref 96–106)
Creatinine, Ser: 0.75 mg/dL (ref 0.57–1.00)
GFR calc Af Amer: 93 mL/min/{1.73_m2} (ref >59)
GFR calc non Af Amer: 80 mL/min/{1.73_m2} (ref >59)
Glucose: 101 mg/dL — ABNORMAL HIGH (ref 65–99)
Potassium: 4 mmol/L (ref 3.5–5.2)
Sodium: 139 mmol/L (ref 134–144)

## 2020-03-22 ENCOUNTER — Other Ambulatory Visit: Payer: Self-pay

## 2020-03-22 ENCOUNTER — Encounter: Payer: Self-pay | Admitting: Family Medicine

## 2020-03-22 ENCOUNTER — Ambulatory Visit (INDEPENDENT_AMBULATORY_CARE_PROVIDER_SITE_OTHER): Payer: Medicare HMO | Admitting: Family Medicine

## 2020-03-22 VITALS — BP 126/82 | HR 89 | Temp 97.1°F | Ht 63.0 in | Wt 192.4 lb

## 2020-03-22 DIAGNOSIS — F419 Anxiety disorder, unspecified: Secondary | ICD-10-CM | POA: Diagnosis not present

## 2020-03-22 DIAGNOSIS — G4700 Insomnia, unspecified: Secondary | ICD-10-CM

## 2020-03-22 DIAGNOSIS — R7303 Prediabetes: Secondary | ICD-10-CM

## 2020-03-22 DIAGNOSIS — M25562 Pain in left knee: Secondary | ICD-10-CM | POA: Diagnosis not present

## 2020-03-22 DIAGNOSIS — I10 Essential (primary) hypertension: Secondary | ICD-10-CM | POA: Diagnosis not present

## 2020-03-22 DIAGNOSIS — G8929 Other chronic pain: Secondary | ICD-10-CM | POA: Diagnosis not present

## 2020-03-22 DIAGNOSIS — R69 Illness, unspecified: Secondary | ICD-10-CM | POA: Diagnosis not present

## 2020-03-22 DIAGNOSIS — F339 Major depressive disorder, recurrent, unspecified: Secondary | ICD-10-CM | POA: Diagnosis not present

## 2020-03-22 MED ORDER — TRAZODONE HCL 50 MG PO TABS: ORAL_TABLET | ORAL | 0 refills | Status: DC

## 2020-03-22 MED ORDER — ENALAPRIL MALEATE 20 MG PO TABS: 20.0000 mg | ORAL_TABLET | Freq: Every day | ORAL | 1 refills | Status: DC

## 2020-03-22 NOTE — Patient Instructions (Addendum)
Take 1 tab xanax 0.5 at night, then take 1/2 tablet trazodone, for 7 days,   Then take 1/2 tablet 0.5mg  xanax and take 1 tab trazodone for 7 days.   Then stop the xanax and go to just 1-2 tab trazodone for sleep.  Take paxil in AM instead of night.     Results for orders placed or performed in visit on 02/24/20  Basic metabolic panel  Result Value Ref Range   Glucose 101 (H) 65 - 99 mg/dL   BUN 10 8 - 27 mg/dL   Creatinine, Ser 8.36 0.57 - 1.00 mg/dL   GFR calc non Af Amer 80 >59 mL/min/1.73   GFR calc Af Amer 93 >59 mL/min/1.73   BUN/Creatinine Ratio 13 12 - 28   Sodium 139 134 - 144 mmol/L   Potassium 4.0 3.5 - 5.2 mmol/L   Chloride 99 96 - 106 mmol/L   CO2 26 20 - 29 mmol/L   Calcium 9.4 8.7 - 10.3 mg/dL  Hemoglobin O2H  Result Value Ref Range   Hgb A1c MFr Bld 5.9 (H) 4.8 - 5.6 %   Est. average glucose Bld gHb Est-mCnc 123 mg/dL    Diabetes Mellitus and Nutrition, Adult When you have diabetes (diabetes mellitus), it is very important to have healthy eating habits because your blood sugar (glucose) levels are greatly affected by what you eat and drink. Eating healthy foods in the appropriate amounts, at about the same times every day, can help you:  Control your blood glucose.  Lower your risk of heart disease.  Improve your blood pressure.  Reach or maintain a healthy weight. Every person with diabetes is different, and each person has different needs for a meal plan. Your health care provider may recommend that you work with a diet and nutrition specialist (dietitian) to make a meal plan that is best for you. Your meal plan may vary depending on factors such as:  The calories you need.  The medicines you take.  Your weight.  Your blood glucose, blood pressure, and cholesterol levels.  Your activity level.  Other health conditions you have, such as heart or kidney disease. How do carbohydrates affect me? Carbohydrates, also called carbs, affect your blood  glucose level more than any other type of food. Eating carbs naturally raises the amount of glucose in your blood. Carb counting is a method for keeping track of how many carbs you eat. Counting carbs is important to keep your blood glucose at a healthy level, especially if you use insulin or take certain oral diabetes medicines. It is important to know how many carbs you can safely have in each meal. This is different for every person. Your dietitian can help you calculate how many carbs you should have at each meal and for each snack. Foods that contain carbs include:  Bread, cereal, rice, pasta, and crackers.  Potatoes and corn.  Peas, beans, and lentils.  Milk and yogurt.  Fruit and juice.  Desserts, such as cakes, cookies, ice cream, and candy. How does alcohol affect me? Alcohol can cause a sudden decrease in blood glucose (hypoglycemia), especially if you use insulin or take certain oral diabetes medicines. Hypoglycemia can be a life-threatening condition. Symptoms of hypoglycemia (sleepiness, dizziness, and confusion) are similar to symptoms of having too much alcohol. If your health care provider says that alcohol is safe for you, follow these guidelines:  Limit alcohol intake to no more than 1 drink per day for nonpregnant women and 2 drinks  per day for men. One drink equals 12 oz of beer, 5 oz of wine, or 1 oz of hard liquor.  Do not drink on an empty stomach.  Keep yourself hydrated with water, diet soda, or unsweetened iced tea.  Keep in mind that regular soda, juice, and other mixers may contain a lot of sugar and must be counted as carbs. What are tips for following this plan?  Reading food labels  Start by checking the serving size on the "Nutrition Facts" label of packaged foods and drinks. The amount of calories, carbs, fats, and other nutrients listed on the label is based on one serving of the item. Many items contain more than one serving per package.  Check the  total grams (g) of carbs in one serving. You can calculate the number of servings of carbs in one serving by dividing the total carbs by 15. For example, if a food has 30 g of total carbs, it would be equal to 2 servings of carbs.  Check the number of grams (g) of saturated and trans fats in one serving. Choose foods that have low or no amount of these fats.  Check the number of milligrams (mg) of salt (sodium) in one serving. Most people should limit total sodium intake to less than 2,300 mg per day.  Always check the nutrition information of foods labeled as "low-fat" or "nonfat". These foods may be higher in added sugar or refined carbs and should be avoided.  Talk to your dietitian to identify your daily goals for nutrients listed on the label. Shopping  Avoid buying canned, premade, or processed foods. These foods tend to be high in fat, sodium, and added sugar.  Shop around the outside edge of the grocery store. This includes fresh fruits and vegetables, bulk grains, fresh meats, and fresh dairy. Cooking  Use low-heat cooking methods, such as baking, instead of high-heat cooking methods like deep frying.  Cook using healthy oils, such as olive, canola, or sunflower oil.  Avoid cooking with butter, cream, or high-fat meats. Meal planning  Eat meals and snacks regularly, preferably at the same times every day. Avoid going long periods of time without eating.  Eat foods high in fiber, such as fresh fruits, vegetables, beans, and whole grains. Talk to your dietitian about how many servings of carbs you can eat at each meal.  Eat 4-6 ounces (oz) of lean protein each day, such as lean meat, chicken, fish, eggs, or tofu. One oz of lean protein is equal to: ? 1 oz of meat, chicken, or fish. ? 1 egg. ?  cup of tofu.  Eat some foods each day that contain healthy fats, such as avocado, nuts, seeds, and fish. Lifestyle  Check your blood glucose regularly.  Exercise regularly as told  by your health care provider. This may include: ? 150 minutes of moderate-intensity or vigorous-intensity exercise each week. This could be brisk walking, biking, or water aerobics. ? Stretching and doing strength exercises, such as yoga or weightlifting, at least 2 times a week.  Take medicines as told by your health care provider.  Do not use any products that contain nicotine or tobacco, such as cigarettes and e-cigarettes. If you need help quitting, ask your health care provider.  Work with a Veterinary surgeon or diabetes educator to identify strategies to manage stress and any emotional and social challenges. Questions to ask a health care provider  Do I need to meet with a diabetes educator?  Do I need  to meet with a dietitian?  What number can I call if I have questions?  When are the best times to check my blood glucose? Where to find more information:  American Diabetes Association: diabetes.org  Academy of Nutrition and Dietetics: www.eatright.[employer] of Diabetes and Digestive and Kidney Diseases (NIH): [employer] Summary  A healthy meal plan will help you control your blood glucose and maintain a healthy lifestyle.  Working with a diet and nutrition specialist (dietitian) can help you make a meal plan that is best for you.  Keep in mind that carbohydrates (carbs) and alcohol have immediate effects on your blood glucose levels. It is important to count carbs and to use alcohol carefully. This information is not intended to replace advice given to you by your health care provider. Make sure you discuss any questions you have with your health care provider. Document Revised: 08/02/2017 Document Reviewed: 09/24/2016 Elsevier Patient Education  2020 [employer].

## 2020-03-22 NOTE — Progress Notes (Signed)
Patient ID: Melissa Wiley, female    DOB: 18-May-1951, 69 y.o.   MRN: [id number]   Chief Complaint  Patient presents with  . Hypertension    follow up   Subjective:    HPI  Impaired fasting bg- Slight elevation on glucose and a1c at 5.9. Due to pandemic and cooking more at home.  Watching salt/sugar in past year. Gained 20 lbs in past year.  Living alone and inc cooking and inc in carbs. Feeling very tired.  Eating and snacking when not hungry. Hard to sleep.  Staying up till 1:30AM. Alone and watching tv, eating more than she used to.  H/o anxiety- taking xanax 0.5mg , has been on this since 2014. Taking 1-2 tab qhs. Pt also on paxil 20mg .   htn- pt taking hctz and enalapril.  Not having any chest pain, headache, dizziness, or lower leg edema.  Medical History Reneta has a past medical history of Anemia, Anxiety, Arthritis, Grand mal seizure disorder (HCC) (1950's), Headache, migraines, Hypertension, Insomnia, and Stress.   Outpatient Encounter Medications as of 03/22/2020  Medication Sig  . acyclovir ointment (ZOVIRAX) 5 % Apply 1 application topically every 3 (three) hours.  . ALPRAZolam (XANAX) 0.5 MG tablet Take 1-2 tablets po at bedtime prn  . enalapril (VASOTEC) 20 MG tablet Take 1 tablet (20 mg total) by mouth daily.  . fexofenadine-pseudoephedrine (ALLEGRA-D 24) 180-240 MG 24 hr tablet Take 1 tablet by mouth daily.  . fluticasone (FLONASE) 50 MCG/ACT nasal spray Place 2 sprays into both nostrils daily as needed for allergies. (Patient not taking: Reported on 03/22/2020)  . hydrochlorothiazide (HYDRODIURIL) 25 MG tablet Take 1/2 (one-half) tablet by mouth once daily  . ketoconazole (NIZORAL) 2 % cream Apply topically 2 (two) times daily.  . Multiple Vitamins-Minerals (MULTIVITAMIN WITH MINERALS) tablet Take 1 tablet by mouth daily.  03/24/2020 PARoxetine (PAXIL) 20 MG tablet TAKE 2 QAM  . traZODone (DESYREL) 50 MG tablet Take 1-2 tab p.o. qhs.  . [DISCONTINUED]  amoxicillin (AMOXIL) 500 MG capsule Take 1 capsule (500 mg total) by mouth 3 (three) times daily.  . [DISCONTINUED] enalapril (VASOTEC) 20 MG tablet Take 1 tablet (20 mg total) by mouth daily.   No facility-administered encounter medications on file as of 03/22/2020.     Review of Systems  Constitutional: Negative for chills and fever.  HENT: Negative for congestion, rhinorrhea and sore throat.   Respiratory: Negative for cough, shortness of breath and wheezing.   Cardiovascular: Negative for chest pain and leg swelling.  Gastrointestinal: Negative for abdominal pain, diarrhea, nausea and vomiting.  Genitourinary: Negative for dysuria and frequency.  Musculoskeletal: Negative for arthralgias and back pain.  Skin: Negative for rash.  Neurological: Negative for dizziness, weakness and headaches.     Vitals BP 126/82   Pulse 89   Temp (!) 97.1 F (36.2 C) (Oral)   Ht 5\' 3"  (1.6 m)   Wt 192 lb 6.4 oz (87.3 kg)   SpO2 98%   BMI 34.08 kg/m   Objective:   Physical Exam Vitals and nursing note reviewed.  Constitutional:      Appearance: Normal appearance.  HENT:     Head: Normocephalic and atraumatic.     Nose: Nose normal.     Mouth/Throat:     Mouth: Mucous membranes are moist.     Pharynx: Oropharynx is clear.  Eyes:     Extraocular Movements: Extraocular movements intact.     Conjunctiva/sclera: Conjunctivae normal.     Pupils: Pupils are  equal, round, and reactive to light.  Cardiovascular:     Rate and Rhythm: Normal rate and regular rhythm.     Pulses: Normal pulses.     Heart sounds: Normal heart sounds.  Pulmonary:     Effort: Pulmonary effort is normal.     Breath sounds: Normal breath sounds. No wheezing, rhonchi or rales.  Musculoskeletal:        General: Normal range of motion.     Right lower leg: No edema.     Left lower leg: No edema.  Skin:    General: Skin is warm and dry.     Findings: No lesion or rash.  Neurological:     General: No focal  deficit present.     Mental Status: She is alert and oriented to person, place, and time.  Psychiatric:        Mood and Affect: Mood normal.        Behavior: Behavior normal.      Assessment and Plan   1. Prediabetes  2. Episode of recurrent major depressive disorder, unspecified depression episode severity (HCC)  3. Anxiety  4. Insomnia, unspecified type - traZODone (DESYREL) 50 MG tablet; Take 1-2 tab p.o. qhs.  Dispense: 60 tablet; Refill: 0  5. Chronic pain of left knee  6. Essential hypertension, benign - enalapril (VASOTEC) 20 MG tablet; Take 1 tablet (20 mg total) by mouth daily.  Dispense: 90 tablet; Refill: 1   Pre-dm- cont to watch carb intake and inc exercising. Recheck next visit.  Depression/anxiety- pt to try to taper off the xanax and only take 1 tab at night. And start trazodone.  Gave tapering dose on handout.  To taper down own the next 2 wks and inc the trazodone at night to 1-2 tab qhs. Advising to take paxil in AM instead of night time. Has xanax with new refill.   Pre-dm- Gave handout on diet modifications.   htn- stable, cont meds.  H/o chronic left knee pain- stable tylenol prn.    F/u 22mo or  Recheck insomnia, anxiety, knee pain.

## 2020-04-07 ENCOUNTER — Other Ambulatory Visit: Payer: Self-pay

## 2020-04-07 ENCOUNTER — Encounter: Payer: Self-pay | Admitting: Nurse Practitioner

## 2020-04-07 ENCOUNTER — Ambulatory Visit (INDEPENDENT_AMBULATORY_CARE_PROVIDER_SITE_OTHER): Payer: Medicare HMO | Admitting: Nurse Practitioner

## 2020-04-07 VITALS — BP 122/76 | HR 85 | Temp 99.3°F | Ht 63.0 in | Wt 188.8 lb

## 2020-04-07 DIAGNOSIS — K219 Gastro-esophageal reflux disease without esophagitis: Secondary | ICD-10-CM | POA: Diagnosis not present

## 2020-04-07 DIAGNOSIS — K5900 Constipation, unspecified: Secondary | ICD-10-CM

## 2020-04-07 DIAGNOSIS — D649 Anemia, unspecified: Secondary | ICD-10-CM

## 2020-04-07 MED ORDER — PANTOPRAZOLE SODIUM 40 MG PO TBEC: 40.0000 mg | DELAYED_RELEASE_TABLET | Freq: Every day | ORAL | 3 refills | Status: DC

## 2020-04-07 NOTE — Assessment & Plan Note (Signed)
GERD currently doing well on PPI.  Requested refill which was sent to her pharmacy.  Continue current medications and follow-up in 1 year.  Call for worsening symptoms before then.

## 2020-04-07 NOTE — Patient Instructions (Addendum)
Your health issues we discussed today were:   GERD (reflux/heartburn): 1. As requested, I sent a refill for Protonix to your pharmacy 2. Call us if you have any worsening or severe symptoms  Anemia: 1. Overall, your labs have looked stable over the last year 2. Have your blood cell test checked when you are able to, although it is not urgent 3. Call us if you notice any obvious bleeding in your stools  Constipation: 1. I am glad you have found something that works for your mild constipation (smooth move tea) 2. Continue to use this as long as it works 3. Call us if you have any worsening or severe symptoms  Overall I recommend:  1. Congratulations on your excellent weight loss efforts! 2. Continue your other current medications 3. Return for follow-up in 1 year 4. Call us if you have any questions or concerns   ---------------------------------------------------------------  I am glad you have gotten your COVID-19 vaccination!  Even though you are fully vaccinated you should continue to follow CDC and state/local guidelines.  ---------------------------------------------------------------   At [facility] we value your feedback. You may receive a survey about your visit today. Please share your experience as we strive to create trusting relationships with our patients to provide genuine, compassionate, quality care.  We appreciate your understanding and patience as we review any laboratory studies, imaging, and other diagnostic tests that are ordered as we care for you. Our office policy is 5 business days for review of these results, and any emergent or urgent results are addressed in a timely manner for your best interest. If you do not hear from our office in 1 week, please contact us.   We also encourage the use of MyChart, which contains your medical information for your review as well. If you are not enrolled in this feature, an access code is on this after  visit summary for your convenience. Thank you for allowing [place] to be involved in your care.  It was great to see you today!  I hope you have a great Summer!!

## 2020-04-07 NOTE — Assessment & Plan Note (Signed)
Overall anemia stable.  She has not been checked in 5 months.  I will put in for CBC recheck to ensure stability.  If no issues then follow-up in 1 year.  Notify us of any bleeding

## 2020-04-07 NOTE — Progress Notes (Signed)
CC'ED TO PCP 

## 2020-04-07 NOTE — Progress Notes (Signed)
Referring Provider: Merlyn Albert, MD Primary Care Physician:  Annalee Genta, DO Primary GI:  Dr. Jena Gauss (in the absence of Dr. Darrick Penna); pending Dr. Marletta Lor  Chief Complaint  Patient presents with  . Anemia    HPI:   Melissa Wiley is a 69 y.o. female who presents for follow-up.  The patient was last seen in our office 02/04/2019 for normocytic anemia and GERD.  Colonoscopy up-to-date 02/03/2018 recommended no repeat unless symptoms develop.  EGD the same day with mild gastritis found to be positive for H. pylori and treated with triple therapy.  Confirmed eradication by H. pylori breath testing.  Noted mildly microcytic and hypochromic hemoglobin 11.0 in 2019.  At her last visit denies any overt GI complaints.  Noted most recently her labs were checked "not long ago" with primary care.  We indicated we would request labs from her primary care, continue PPI, follow-up in 1 year.  When we received her PCP labs CBC was not completed so we recommended recheck.  This was completed 04/21/2019 which showed stable mild anemia with hemoglobin 11.0, very mildly microcytic and hypochromic.  Most recent CBC 11/12/2019 showed normalized hemoglobin at 11.1, again very mildly hypochromic but now normocytic.  Today she states she's doing ok overall. Denies any obvious bleeding. Denies abdominal pain, N/V, hematochezia, melena, fever, chills, unintentional weight loss. Her BP is a little low today, feels asymptomatic at this time. Denies URI or flu-like symptoms. Denies loss of sense of taste or smell. The patient has received COVID-19 vaccination(s). Denies chest pain, dyspnea, dizziness, lightheadedness, syncope, near syncope. Denies any other upper or lower GI symptoms.  She has been eating healthier and losing weight. Her hgb a1c was 5.9, but she had just started her weight loss journey and she is hopeful her a1c will be improved at her next visit.  She is needing a refill of Protonix currently. She  has started having some mild constipation with starting constipation with a stool every 3-4 days. Started taking "Smooth move" which she has started and works well.  Past Medical History:  Diagnosis Date  . Anemia   . Anxiety   . Arthritis   . 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 Headache   . Hx of migraines   . Hypertension   . Insomnia   . Stress     Past Surgical History:  Procedure Laterality Date  . BIOPSY  02/03/2018   Procedure: BIOPSY;  Surgeon: West Bali, MD;  Location: [facility];  Service: Endoscopy;;  duodenum,gastric  . CESAREAN SECTION  1985  . COLONOSCOPY WITH PROPOFOL N/A 02/03/2018   Procedure: COLONOSCOPY WITH PROPOFOL;  Surgeon: West Bali, MD;  Location: [facility];  Service: Endoscopy;  Laterality: N/A;  7:30am  . ESOPHAGOGASTRODUODENOSCOPY (EGD) WITH PROPOFOL N/A 02/03/2018   Procedure: ESOPHAGOGASTRODUODENOSCOPY (EGD) WITH PROPOFOL;  Surgeon: West Bali, MD;  Location: [facility];  Service: Endoscopy;  Laterality: N/A;    Current Outpatient Medications  Medication Sig Dispense Refill  . enalapril (VASOTEC) 20 MG tablet Take 1 tablet (20 mg total) by mouth daily. 90 tablet 1  . fexofenadine-pseudoephedrine (ALLEGRA-D 24) 180-240 MG 24 hr tablet Take 1 tablet by mouth daily.    . hydrochlorothiazide (HYDRODIURIL) 25 MG tablet Take 1/2 (one-half) tablet by mouth once daily 45 tablet 0  . Multiple Vitamins-Minerals (MULTIVITAMIN WITH MINERALS) tablet Take 1 tablet by mouth daily.    . pantoprazole (PROTONIX) 40 MG tablet Take  40 mg by mouth daily.    Marland Kitchen PARoxetine (PAXIL) 20 MG tablet TAKE 2 QAM 180 tablet 1  . traZODone (DESYREL) 50 MG tablet Take 1-2 tab p.o. qhs. 60 tablet 0   No current facility-administered medications for this visit.    Allergies as of 04/07/2020  . (No Known Allergies)    Family History  Problem Relation Age of Onset  . Hypertension Mother   . Hypertension Sister   . Diabetes Sister   . Heart disease  Sister   . Colon cancer Neg Hx   . Colon polyps Neg Hx     Social History   Socioeconomic History  . Marital status: Single    Spouse name: Not on file  . Number of children: Not on file  . Years of education: Not on file  . Highest education level: Not on file  Occupational History  . Not on file  Tobacco Use  . Smoking status: Never Smoker  . Smokeless tobacco: Never Used  Vaping Use  . Vaping Use: Never used  Substance and Sexual Activity  . Alcohol use: Never  . Drug use: Never  . Sexual activity: Yes  Other Topics Concern  . Not on file  Social History Narrative   SPECIAL NEEDS CHILD PASSED IN 2019.   Social Determinants of Health   Financial Resource Strain:   . Difficulty of Paying Living Expenses:   Food Insecurity:   . Worried About Programme researcher, broadcasting/film/video in the Last Year:   . Barista in the Last Year:   Transportation Needs:   . Freight forwarder (Medical):   Marland Kitchen Lack of Transportation (Non-Medical):   Physical Activity:   . Days of Exercise per Week:   . Minutes of Exercise per Session:   Stress:   . Feeling of Stress :   Social Connections:   . Frequency of Communication with Friends and Family:   . Frequency of Social Gatherings with Friends and Family:   . Attends Religious Services:   . Active Member of Clubs or Organizations:   . Attends Banker Meetings:   Marland Kitchen Marital Status:     Subjective: Review of Systems  Constitutional: Negative for chills, fever, malaise/fatigue and weight loss.  HENT: Negative for congestion and sore throat.   Respiratory: Negative for cough and shortness of breath.   Cardiovascular: Negative for chest pain and palpitations.  Gastrointestinal: Negative for abdominal pain, blood in stool, diarrhea, melena, nausea and vomiting.  Musculoskeletal: Negative for joint pain and myalgias.  Skin: Negative for rash.  Neurological: Negative for dizziness and weakness.  Endo/Heme/Allergies: Does not  bruise/bleed easily.  Psychiatric/Behavioral: Negative for depression. The patient is not nervous/anxious.   All other systems reviewed and are negative.    Objective: BP 98/60   Pulse 85   Temp 99.3 F (37.4 C) (Oral)   Ht 5\' 3"  (1.6 m)   Wt 188 lb 12.8 oz (85.6 kg)   BMI 33.44 kg/m  Physical Exam Vitals and nursing note reviewed.  Constitutional:      General: She is not in acute distress.    Appearance: Normal appearance. She is well-developed. She is not ill-appearing, toxic-appearing or diaphoretic.  HENT:     Head: Normocephalic and atraumatic.     Nose: No congestion or rhinorrhea.  Eyes:     General: No scleral icterus. Cardiovascular:     Rate and Rhythm: Normal rate and regular rhythm.     Heart sounds:  Normal heart sounds.  Pulmonary:     Effort: Pulmonary effort is normal. No respiratory distress.     Breath sounds: Normal breath sounds.  Abdominal:     General: Bowel sounds are normal.     Palpations: Abdomen is soft. There is no hepatomegaly, splenomegaly or mass.     Tenderness: There is no abdominal tenderness. There is no guarding or rebound.     Hernia: No hernia is present.  Skin:    General: Skin is warm and dry.     Coloration: Skin is not jaundiced.     Findings: No rash.  Neurological:     General: No focal deficit present.     Mental Status: She is alert and oriented to person, place, and time.  Psychiatric:        Attention and Perception: Attention normal.        Mood and Affect: Mood normal.        Speech: Speech normal.        Behavior: Behavior normal.        Thought Content: Thought content normal.        Cognition and Memory: Cognition and memory normal.       04/07/2020 10:30 AM   Disclaimer: This note was dictated with voice recognition software. Similar sounding words can inadvertently be transcribed and may not be corrected upon review.

## 2020-04-07 NOTE — Assessment & Plan Note (Signed)
The patient is started having very mild constipation with change in medication from Xanax to trazodone.  She has been using "smooth move" tea which is working for her.  Recommend she continue this as long as it works.  Call for any worsening or breakthrough constipation.  Follow-up in 1 year.

## 2020-04-13 ENCOUNTER — Other Ambulatory Visit: Payer: Self-pay | Admitting: *Deleted

## 2020-04-13 MED ORDER — PAROXETINE HCL 20 MG PO TABS: ORAL_TABLET | ORAL | 0 refills | Status: DC

## 2020-04-21 ENCOUNTER — Ambulatory Visit: Payer: Medicare HMO | Admitting: Family Medicine

## 2020-04-25 DIAGNOSIS — K219 Gastro-esophageal reflux disease without esophagitis: Secondary | ICD-10-CM | POA: Diagnosis not present

## 2020-04-25 LAB — CBC WITH DIFFERENTIAL/PLATELET
Absolute Monocytes: 394 cells/uL (ref 200–950)
Basophils Absolute: 41 cells/uL (ref 0–200)
Basophils Relative: 0.5 %
Eosinophils Absolute: 180 cells/uL (ref 15–500)
Eosinophils Relative: 2.2 %
HCT: 36.8 % (ref 35.0–45.0)
Hemoglobin: 11.5 g/dL — ABNORMAL LOW (ref 11.7–15.5)
Lymphs Abs: 2411 cells/uL (ref 850–3900)
MCH: 25.2 pg — ABNORMAL LOW (ref 27.0–33.0)
MCHC: 31.3 g/dL — ABNORMAL LOW (ref 32.0–36.0)
MCV: 80.5 fL (ref 80.0–100.0)
MPV: 10.3 fL (ref 7.5–12.5)
Monocytes Relative: 4.8 %
Neutro Abs: 5174 cells/uL (ref 1500–7800)
Neutrophils Relative %: 63.1 %
Platelets: 304 10*3/uL (ref 140–400)
RBC: 4.57 10*6/uL (ref 3.80–5.10)
RDW: 12.7 % (ref 11.0–15.0)
Total Lymphocyte: 29.4 %
WBC: 8.2 10*3/uL (ref 3.8–10.8)

## 2020-04-27 ENCOUNTER — Telehealth: Payer: Self-pay

## 2020-04-27 NOTE — Telephone Encounter (Signed)
Pt returned call, informed of CBC result from Wynne Dust NP.

## 2020-04-29 ENCOUNTER — Ambulatory Visit (INDEPENDENT_AMBULATORY_CARE_PROVIDER_SITE_OTHER): Payer: Medicare HMO | Admitting: Nurse Practitioner

## 2020-04-29 ENCOUNTER — Other Ambulatory Visit: Payer: Self-pay

## 2020-04-29 ENCOUNTER — Encounter: Payer: Self-pay | Admitting: Nurse Practitioner

## 2020-04-29 VITALS — BP 122/70 | HR 98 | Temp 97.8°F | Wt 190.8 lb

## 2020-04-29 DIAGNOSIS — R7303 Prediabetes: Secondary | ICD-10-CM | POA: Diagnosis not present

## 2020-04-29 DIAGNOSIS — G4709 Other insomnia: Secondary | ICD-10-CM | POA: Diagnosis not present

## 2020-04-29 DIAGNOSIS — I10 Essential (primary) hypertension: Secondary | ICD-10-CM

## 2020-04-29 DIAGNOSIS — Z780 Asymptomatic menopausal state: Secondary | ICD-10-CM | POA: Diagnosis not present

## 2020-04-29 DIAGNOSIS — M1712 Unilateral primary osteoarthritis, left knee: Secondary | ICD-10-CM | POA: Diagnosis not present

## 2020-04-29 MED ORDER — ALPRAZOLAM 0.5 MG PO TABS: 0.5000 mg | ORAL_TABLET | Freq: Every evening | ORAL | 2 refills | Status: DC | PRN

## 2020-04-29 MED ORDER — ENALAPRIL MALEATE 20 MG PO TABS: 20.0000 mg | ORAL_TABLET | Freq: Every day | ORAL | 1 refills | Status: DC

## 2020-04-29 MED ORDER — HYDROCHLOROTHIAZIDE 25 MG PO TABS: ORAL_TABLET | ORAL | 1 refills | Status: DC

## 2020-04-29 NOTE — Progress Notes (Signed)
Subjective:    Patient ID: Melissa Wiley, female    DOB: 03-23-1951, 69 y.o.   MRN: [id number]  HPI Patient reports this visit for follow-up. Patient was seen by Dr. Lovena Le and her adjustments were made to her medications. Patient reports she stopped taking Xanax as instructed by Dr. Lovena Le and began takingTrazodone at night to help her sleep. She reports side effects from Trazodone (headaches and constipation). She has since stopped the Trazodone and resumed her Xanax 0.16m at night. She would like to continue with Xanax as previously prescribed. Patient also reports intermittent left knee pain with swelling usually alleviated with Tylenol, rest, and ice. She was endorses being diagnosed with arthritis previously. Her pain does not limit her activity and she participates in daily walking and water aerobics at the [facility] Immunizations and annual screenings reviewed. Colonoscopy completed in 2019 and mammography 11/2019. She is followed by GI. No recent Dexa scan noted. Since July she has refrained from eating sweets in her diet in effort to control her A1c and weight.   Review of Systems  Constitutional: Negative for activity change, appetite change, chills, fatigue, fever and unexpected weight change.  Respiratory: Negative for cough, chest tightness, shortness of breath and wheezing.   Cardiovascular: Negative for chest pain, palpitations and leg swelling.  Gastrointestinal: Positive for constipation (resolving after stopping Trazodone ). Negative for abdominal distention, abdominal pain, blood in stool, diarrhea, nausea and vomiting.  Endocrine: Negative for cold intolerance and heat intolerance.  Musculoskeletal: Positive for arthralgias and joint swelling (left knee). Negative for gait problem.  Skin: Negative for rash and wound.  Allergic/Immunologic: Negative.   Neurological: Positive for headaches (resolved after stopping Trazodone). Negative for dizziness, syncope and light-headedness.    Psychiatric/Behavioral: Positive for sleep disturbance (Resolved with resuming xanax).       Objective:   Physical Exam Constitutional:      General: She is not in acute distress.    Appearance: Normal appearance. She is obese. She is not ill-appearing.  HENT:     Head: Normocephalic.  Cardiovascular:     Rate and Rhythm: Normal rate and regular rhythm.     Heart sounds: Normal heart sounds.  Pulmonary:     Effort: Pulmonary effort is normal.     Breath sounds: Normal breath sounds. No wheezing or rhonchi.  Abdominal:     General: There is no distension.     Palpations: Abdomen is soft. There is no mass.     Tenderness: There is no abdominal tenderness. There is no guarding.  Musculoskeletal:        General: Swelling (Left knee) present. No tenderness.     Comments: Left knee: mild edema; no erythema. No joint laxity or crepitus. No tenderness noted with ROM. No tenderness or mass in the popliteal area. Patient has signs of osteoarthritis in both hands.   Skin:    General: Skin is warm and dry.     Findings: No rash.  Neurological:     Mental Status: She is alert and oriented to person, place, and time.  Psychiatric:        Mood and Affect: Mood normal.        Behavior: Behavior normal.        Thought Content: Thought content normal.        Judgment: Judgment normal.     Comments: Cheerful affect. Smiling and laughing.     Recent Results (from the past 2160 hour(s))  Basic metabolic panel  Status: Abnormal   Collection Time: 03/17/20 10:32 AM  Result Value Ref Range   Glucose 101 (H) 65 - 99 mg/dL   BUN 10 8 - 27 mg/dL   Creatinine, Ser 0.75 0.57 - 1.00 mg/dL   GFR calc non Af Amer 80 >59 mL/min/1.73   GFR calc Af Amer 93 >59 mL/min/1.73    Comment: **[facility] currently reports eGFR in compliance with the current**   recommendations of the [employer]. [facility] will   update reporting as new guidelines are published from the NKF-ASN   Task force.     BUN/Creatinine Ratio 13 12 - 28   Sodium 139 134 - 144 mmol/L   Potassium 4.0 3.5 - 5.2 mmol/L   Chloride 99 96 - 106 mmol/L   CO2 26 20 - 29 mmol/L   Calcium 9.4 8.7 - 10.3 mg/dL  Hemoglobin A1c     Status: Abnormal   Collection Time: 03/17/20 10:32 AM  Result Value Ref Range   Hgb A1c MFr Bld 5.9 (H) 4.8 - 5.6 %    Comment:          Prediabetes: 5.7 - 6.4          Diabetes: >6.4          Glycemic control for adults with diabetes: <7.0    Est. average glucose Bld gHb Est-mCnc 123 mg/dL  CBC with Differential/Platelet     Status: Abnormal   Collection Time: 04/25/20  8:18 AM  Result Value Ref Range   WBC 8.2 3.8 - 10.8 Thousand/uL   RBC 4.57 3.80 - 5.10 Million/uL   Hemoglobin 11.5 (L) 11.7 - 15.5 g/dL   HCT 36.8 35 - 45 %   MCV 80.5 80.0 - 100.0 fL   MCH 25.2 (L) 27.0 - 33.0 pg   MCHC 31.3 (L) 32.0 - 36.0 g/dL   RDW 12.7 11.0 - 15.0 %   Platelets 304 140 - 400 Thousand/uL   MPV 10.3 7.5 - 12.5 fL   Neutro Abs 5,174 1,500 - 7,800 cells/uL   Lymphs Abs 2,411 850 - 3,900 cells/uL   Absolute Monocytes 394 200 - 950 cells/uL   Eosinophils Absolute 180 15 - 500 cells/uL   Basophils Absolute 41 0 - 200 cells/uL   Neutrophils Relative % 63.1 %   Total Lymphocyte 29.4 %   Monocytes Relative 4.8 %   Eosinophils Relative 2.2 %   Basophils Relative 0.5 %   Reviewed labs with patient.       Assessment & Plan:   Problem List Items Addressed This Visit      Cardiovascular and Mediastinum   Essential hypertension, benign   Relevant Medications   enalapril (VASOTEC) 20 MG tablet   hydrochlorothiazide (HYDRODIURIL) 25 MG tablet     Musculoskeletal and Integument   Arthritis of left knee     Other   Insomnia - Primary   Prediabetes    Other Visit Diagnoses    Postmenopausal       Relevant Orders   DG Bone Density      Meds ordered this encounter  Medications  . ALPRAZolam (XANAX) 0.5 MG tablet    Sig: Take 1 tablet (0.5 mg total) by mouth at bedtime as needed  for sleep.    Dispense:  30 tablet    Refill:  2    Order Specific Question:   Supervising Provider    Answer:   Sallee Lange A [id number]  . enalapril (VASOTEC) 20 MG tablet  Sig: Take 1 tablet (20 mg total) by mouth daily.    Dispense:  90 tablet    Refill:  1    Order Specific Question:   Supervising Provider    Answer:   Sallee Lange A [[id number]]  . hydrochlorothiazide (HYDRODIURIL) 25 MG tablet    Sig: Take 1/2 (one-half) tablet by mouth once daily    Dispense:  45 tablet    Refill:  1    Order Specific Question:   Supervising Provider    Answer:   Kathyrn Drown [phone]   Education provided to patient regarding bone density screening. Dexa scan to be completed. Instructed patient on importance of weight bearing exercise and healthy diet.  Vitamin Supplementation. Voltaren Gel, Glucosamine Chondroitin, and Lidoderm patches recommended for knee pain.  Continue with current diet modifications to reduce A1c. May resume Xanax 0.34m QHS as needed for insomnia. Stop Trazodone due to side effects.  Omega 3 BID to help reduce elevated triglyceride levels. While patient may be prediabetic, her levels are close to normal. Encouraged continued healthy diet low in sugar and simple carbs, regular activity and weight loss goal of 5 lbs. No medication is indicated at this time.   Return in about 3 months (around 07/30/2020) for routine follow-up.

## 2020-04-29 NOTE — Progress Notes (Signed)
   Subjective:    Patient ID: Melissa Wiley, female    DOB: 1951-04-24, 69 y.o.   MRN: [id number]  HPI Pt here for follow up.  Pt did try Trazodone but it caused headache, constipation and pt stayed all night. Pt went back to xanax 0.5 mg. Takes one xanax per night and sleeps well. Needs refills on Enalapril, HCTZ.  Left knee pain. Dr.Steve advised to take Tylenol due to arthritis.  Pt states that Dr.Taylor told her she was pre diabetic and she has questions.    Review of Systems     Objective:   Physical Exam        Assessment & Plan:

## 2020-04-29 NOTE — Patient Instructions (Signed)
Left Knee Pain: Ice or heat applications  Voltaren Gel or Lidoderm OTC Tylenol as needed Glucosamine Chondroitin OTC  Chronic Knee Pain, Adult Chronic knee pain is pain in one or both knees that lasts longer than 3 months. Symptoms of chronic knee pain may include swelling, stiffness, and discomfort. Age-related wear and tear (osteoarthritis) of the knee joint is the most common cause of chronic knee pain. Other possible causes include:  A long-term immune-related disease that causes inflammation of the knee (rheumatoid arthritis). This usually affects both knees.  Inflammatory arthritis, such as gout or pseudogout.  An injury to the knee that causes arthritis.  An injury to the knee that damages the ligaments. Ligaments are strong tissues that connect bones to each other.  Runner's knee or pain behind the kneecap. Treatment for chronic knee pain depends on the cause. The main treatments for chronic knee pain are physical therapy and weight loss. This condition may also be treated with medicines, injections, a knee sleeve or brace, and by using crutches. Rest, ice, compression (pressure), and elevation (RICE) therapy may also be recommended. Follow these instructions at home: If you have a knee sleeve or brace:   Wear it as told by your health care provider. Remove it only as told by your health care provider.  Loosen it if your toes tingle, become numb, or turn cold and blue.  Keep it clean.  If the sleeve or brace is not waterproof: ? Do not let it get wet. ? Remove it if allowed by your health care provider, or cover it with a watertight covering when you take a bath or a shower. Managing pain, stiffness, and swelling      If directed, apply heat to the affected area as often as told by your health care provider. Use the heat source that your health care provider recommends, such as a moist heat pack or a heating pad. ? If you have a removable sleeve or brace, remove it as  told by your health care provider. ? Place a towel between your skin and the heat source. ? Leave the heat on for 20-30 minutes. ? Remove the heat if your skin turns bright red. This is especially important if you are unable to feel pain, heat, or cold. You may have a greater risk of getting burned.  If directed, put ice on the affected area. ? If you have a removable sleeve or brace, remove it as told by your health care provider. ? Put ice in a plastic bag. ? Place a towel between your skin and the bag. ? Leave the ice on for 20 minutes, 2-3 times a day.  Move your toes often to reduce stiffness and swelling.  Raise (elevate) the injured area above the level of your heart while you are sitting or lying down. Activity  Avoid activities where both feet leave the ground at the same time (high-impact activities). Examples are running, jumping rope, and doing jumping jacks.  Return to your normal activities as told by your health care provider. Ask your health care provider what activities are safe for you.  Follow the exercise plan that your health care provider designed for you. Your health care provider may suggest that you: ? Avoid activities that make knee pain worse. This may require you to change your exercise routines, sport participation, or job duties. ? Wear shoes with cushioned soles. ? Avoid high-impact activities or sports that require running and sudden changes in direction. ? Do physical therapy  as told by your health care provider. Physical therapy is planned to match your needs and abilities. It may include exercises for strength, flexibility, stability, and endurance. ? Do exercises that increase balance and strength, such as tai chi and yoga.  Do not use the injured limb to support your body weight until your health care provider says that you can. Use crutches, a cane, or a walker, as told by your health care provider. General instructions  Take over-the-counter and  prescription medicines only as told by your health care provider.  Lose weight if you are overweight. Losing even a little weight can reduce knee pain. Ask your health care provider what your ideal weight is, and how to safely lose extra weight. A food expert (dietitian) may be able to help you plan your meals.  Do not use any products that contain nicotine or tobacco, such as cigarettes, e-cigarettes, and chewing tobacco. These can delay healing. If you need help quitting, ask your health care provider.  Keep all follow-up visits as told by your health care provider. This is important. Contact a health care provider if:  You have knee pain that is not getting better or gets worse.  You are unable to do your physical therapy exercises due to knee pain. Get help right away if:  Your knee swells and the swelling becomes worse.  You cannot move your knee.  You have severe knee pain. Summary  Knee pain that lasts more than 3 months is considered chronic knee pain.  The main treatments for chronic knee pain are physical therapy and weight loss. You may also need to take medicines, wear a knee sleeve or brace, use crutches, and apply ice or heat.  Losing even a little weight can reduce knee pain. Ask your health care provider what your ideal weight is, and how to safely lose extra weight. A food expert (dietitian) may be able to help you plan your meals.  Work with a physical therapist to make a safe exercise program, as told by your health care provider. This information is not intended to replace advice given to you by your health care provider. Make sure you discuss any questions you have with your health care provider. Document Revised: 10/30/2018 Document Reviewed: 10/30/2018 Elsevier Patient Education  [place].

## 2020-04-30 ENCOUNTER — Encounter: Payer: Self-pay | Admitting: Nurse Practitioner

## 2020-05-10 ENCOUNTER — Other Ambulatory Visit (HOSPITAL_COMMUNITY): Payer: Medicare HMO

## 2020-05-12 ENCOUNTER — Other Ambulatory Visit: Payer: Self-pay

## 2020-05-12 ENCOUNTER — Ambulatory Visit (HOSPITAL_COMMUNITY)
Admission: RE | Admit: 2020-05-12 | Discharge: 2020-05-12 | Disposition: A | Discharge status: Home / Self Care | Payer: Medicare HMO | Source: Ambulatory Visit | Attending: Nurse Practitioner | Admitting: Nurse Practitioner

## 2020-05-12 DIAGNOSIS — M8589 Other specified disorders of bone density and structure, multiple sites: Secondary | ICD-10-CM | POA: Diagnosis not present

## 2020-05-12 DIAGNOSIS — Z780 Asymptomatic menopausal state: Secondary | ICD-10-CM | POA: Diagnosis not present

## 2020-05-23 DIAGNOSIS — H35371 Puckering of macula, right eye: Secondary | ICD-10-CM | POA: Diagnosis not present

## 2020-06-20 DIAGNOSIS — H02831 Dermatochalasis of right upper eyelid: Secondary | ICD-10-CM | POA: Diagnosis not present

## 2020-06-20 DIAGNOSIS — H25813 Combined forms of age-related cataract, bilateral: Secondary | ICD-10-CM | POA: Diagnosis not present

## 2020-06-20 DIAGNOSIS — H02834 Dermatochalasis of left upper eyelid: Secondary | ICD-10-CM | POA: Diagnosis not present

## 2020-06-20 DIAGNOSIS — H35372 Puckering of macula, left eye: Secondary | ICD-10-CM | POA: Diagnosis not present

## 2020-07-08 ENCOUNTER — Encounter: Payer: Self-pay | Admitting: Nurse Practitioner

## 2020-07-08 ENCOUNTER — Other Ambulatory Visit: Payer: Self-pay

## 2020-07-08 ENCOUNTER — Ambulatory Visit (INDEPENDENT_AMBULATORY_CARE_PROVIDER_SITE_OTHER): Payer: Medicare HMO | Admitting: Nurse Practitioner

## 2020-07-08 VITALS — BP 136/84 | HR 90 | Temp 98.1°F | Wt 183.0 lb

## 2020-07-08 DIAGNOSIS — Z01419 Encounter for gynecological examination (general) (routine) without abnormal findings: Secondary | ICD-10-CM | POA: Diagnosis not present

## 2020-07-08 DIAGNOSIS — Z23 Encounter for immunization: Secondary | ICD-10-CM | POA: Diagnosis not present

## 2020-07-08 DIAGNOSIS — M810 Age-related osteoporosis without current pathological fracture: Secondary | ICD-10-CM | POA: Diagnosis not present

## 2020-07-08 MED ORDER — ALENDRONATE SODIUM 70 MG PO TABS: 70.0000 mg | ORAL_TABLET | ORAL | 11 refills | Status: DC

## 2020-07-08 NOTE — Progress Notes (Signed)
Subjective:    Patient ID: Melissa Wiley, female    DOB: Feb 07, 1951, 69 y.o.   MRN: [id number]  HPI  AWV- Annual Wellness Visit  The patient was seen for their annual wellness visit. The patient's past medical history, surgical history, and family history were reviewed. Pertinent vaccines were reviewed ( tetanus, pneumonia, shingles, flu) The patient's medication list was reviewed and updated.  The height and weight were entered.  BMI recorded in electronic record elsewhere  Cognitive screening was completed. Outcome of Mini - Cog: PASS   Falls /depression screening electronically recorded within record elsewhere  Current tobacco usage:none (All patients who use tobacco were given written and verbal information on quitting)  Recent listing of emergency department/hospitalizations over the past year were reviewed.  current specialist the patient sees on a regular basis: gastro 1 x per year   Medicare annual wellness visit patient questionnaire was reviewed.  A written screening schedule for the patient for the next 5-10 years was given. Appropriate discussion of followup regarding next visit was discussed.   ROS:  Denies fevers, unexplained weight loss. No difficulty swallowing or sore throat.  Denies CP, SOB or edema. No cough or wheezing. No abdominal pain, diarrhea, constipation or bloody stools. No N/V or GERD. No frequency, dysuria, vaginal discharge bleeding or pelvic pain. Mild urgency with a full bladder at times. No new sexual partners. No vulvar rash, itching or lesions.      Objective:   Physical Exam General: Pt is alert, well groomed with coherent thought and judgement.  TS:VXBLT sounds are normal without murmur or bruit. Regular rate and rhythm.  Pulm: Breath sounds are equal and clear bilaterally without wheezes or rhonchi.  Chest: Breast are symmetrical without erythema, swelling, mass, tenderness, or nipple change or discharge. Lymph: no cervical,  axillary, pectoral or supraclavicular adenopathy noted Neck: Thyroid without enlargement, mass or tenderness.  GI/GU: Pt defers pelvic exam at this time, denies any lesions sores or discharge. Abdomen is soft and flat without mass, distension, or guarding. See Bone Density results dated 05/12/20.  Today's Vitals   07/08/20 1529  BP: 136/84  Pulse: 90  Temp: 98.1 F (36.7 C)  SpO2: 95%  Weight: 83 kg   Body mass index is 32.42 kg/m.;    Assessment & Plan:   Problem List Items Addressed This Visit      Musculoskeletal and Integument   Osteoporosis   Relevant Medications   alendronate (FOSAMAX) 70 MG tablet    Other Visit Diagnoses    Well woman exam    -  Primary   Need for vaccination       Relevant Orders   Flu Vaccine QUAD 6+ mos PF IM (Fluarix Quad PF) (Completed)     Meds ordered this encounter  Medications  . alendronate (FOSAMAX) 70 MG tablet    Sig: Take 1 tablet (70 mg total) by mouth every 7 (seven) days. Take with a full glass of water on an empty stomach.    Dispense:  4 tablet    Refill:  11    Order Specific Question:   Supervising Provider    Answer:   Babs Sciara [phone]    Reviewed recent labs and scans with the patient. Encouraged Omega 3 fish oil to help bring down triglyceride levels.  Start taking Fosamax, Take on an empty stomach with a full glass of water. Sit upright for 30 min, then you can eat/drink. DC and contact office if abdominal pain or  reflux.  Encouraged to continue with healthy eating habit and physical exercise.   Return in about 1 year (around 07/08/2021) for Physical.

## 2020-07-08 NOTE — Patient Instructions (Addendum)
Omega 3 fish oils will help bring down your triglyceride levels.    With the Fosamax, Take on an empty stomach with a full glass of water. Sit upright for 30 min, then you can eat/drink.  Alendronate oral solution What is this medicine? ALENDRONATE (a LEN droe nate) slows calcium loss from bones. It helps to make normal healthy bone and to slow bone loss in people with Paget's disease and osteoporosis. It may be used in others at risk for bone loss. This medicine may be used for other purposes; ask your health care provider or pharmacist if you have questions. COMMON BRAND NAME(S): Fosamax What should I tell my health care provider before I take this medicine? They need to know if you have any of these conditions:  esophagus, stomach, or intestine problems, like acid reflux or GERD  dental disease  kidney disease  low blood calcium  low vitamin D  problems swallowing  problems sitting or standing for 30 minutes  an unusual or allergic reaction to alendronate, other medicines, foods, dyes, or preservatives  pregnant or trying to get pregnant  breast-feeding How should I use this medicine? You must take this medicine exactly as directed or you will lower the amount of the medicine you absorb into your body or you may cause yourself harm. Take your dose by mouth first thing in the morning, after you are up for the day. Do not eat or drink anything before you take this solution. Use a specially marked spoon or container to measure the oral solution. Take the solution with 2 fluid ounces (1/4 cup) of plain water. Do not take the solution with any other drink. After taking this medicine do not eat breakfast, drink, or take any other medicines or vitamins for at least 30 minutes. Stand or sit up for at least 30 minutes after you take this medicine; do not lie down. Do not take your medicine more often than directed. Take this medicine on the same day every week. Talk to your pediatrician  regarding the use of this medicine in children. Special care may be needed. Overdosage: If you think you have taken too much of this medicine contact a poison control center or emergency room at once. NOTE: This medicine is only for you. Do not share this medicine with others. What if I miss a dose? If you miss a dose, take the dose on the morning after you remember. Then take your next dose on your regular day of the week. Never take 2 doses on the same day. Do not take double or extra doses. What may interact with this medicine?  aluminum hydroxide  antacids  aspirin  calcium supplements  drugs for inflammation like ibuprofen, naproxen, and others  iron supplements  magnesium supplements  vitamins with minerals This list may not describe all possible interactions. Give your health care provider a list of all the medicines, herbs, non-prescription drugs, or dietary supplements you use. Also tell them if you smoke, drink alcohol, or use illegal drugs. Some items may interact with your medicine. What should I watch for while using this medicine? Visit your doctor for regular check ups. It may be some time before you see benefit from this medicine. Do not stop taking your medicine unless your doctor tells you to. Your doctor or health care professional may order regular blood tests to see how you are doing. You should make sure you get enough calcium and vitamin D in your diet while you are taking  this medicine, unless your doctor tells you not to. Discuss the foods you eat and the vitamins you take with your health care professional. Some people who take this medicine have severe bone, joint, and/or muscle pain. This medicine may also increase your risk for a broken thigh bone. Tell your doctor right away if you have pain in your upper leg or groin. Tell your doctor if you have any pain that does not go away or that gets worse. This medicine can make you more sensitive to the sun. If you  get a rash while taking this medicine, sunlight may cause the rash to get worse. Keep out of the sun. If you cannot avoid being in the sun, wear protective clothing and use sunscreen. Do not use sun lamps or tanning beds/booths. What side effects may I notice from receiving this medicine? Side effects that you should report to your doctor or health care professional as soon as possible:  allergic reactions like skin rash, itching or hives, swelling of the face, lips, or tongue  black or tarry stools  bone, muscle, or joint pain  changes in vision  chest pain  heartburn or stomach pain  jaw pain, especially after dental work  redness, blistering, peeling or loosening of the skin, including inside the mouth  trouble or pain when swallowing Side effects that usually do not require medical attention (report to your doctor or health care professional if they continue or are bothersome):  changes in taste  diarrhea or constipation  eye pain or itching  headache  nausea or vomiting  stomach gas or fullness This list may not describe all possible side effects. Call your doctor for medical advice about side effects. You may report side effects to FDA at 1-800-FDA-1088. Where should I keep my medicine? Keep out of the reach of children. Store at room temperature of 15 and 30 degrees C (59 and 86 degrees F). Throw away any unused medicine after the expiration date. NOTE: This sheet is a summary. It may not cover all possible information. If you have questions about this medicine, talk to your doctor, pharmacist, or health care provider.  2020 Elsevier/Gold Standard (2011-02-16 08:54:51)

## 2020-07-09 ENCOUNTER — Encounter: Payer: Self-pay | Admitting: Nurse Practitioner

## 2020-07-21 NOTE — H&P (Signed)
Surgical History & Physical  Patient Name: Melissa Wiley DOB: 1951/02/12  Surgery: Cataract extraction with intraocular lens implant phacoemulsification; Right Eye  Surgeon: Fabio Pierce MD Surgery Date:  08/05/2020 Pre-Op Date:  07/21/2020  HPI: A 59 Yr. old female patient is referred by Dr Charise Killian for cataract eval. 1. The patient complains of difficulty when driving/ Difficulty in viewing TV., which began 1 year ago. Both eyes are affected. The episode is gradual. The condition's severity increased since last visit. Symptoms occur when the patient is driving, inside and outside. This is negatively affecting her quality of life. HPI was performed by Fabio Pierce .  Medical History: Cataracts OD: PVD Anxiety disorder Arthritis High Blood Pressure  Review of Systems Allergic/Immunologic Seasonal Allergies All recorded systems are negative except as noted above.  Social   Never smoked    Medication Atenolol, Enalapril, Hydrochlorothiazide, Paxil, Xanax,   Sx/Procedures C Section, Colonoscopy,   Drug Allergies   NKDA  History & Physical: Heent:  Cataract, Right eye NECK: supple without bruits LUNGS: lungs clear to auscultation CV: regular rate and rhythm Abdomen: soft and non-tender  Impression & Plan: Assessment: 1.  COMBINED FORMS AGE RELATED CATARACT; Both Eyes (H25.813) 2.  MACULAR PUCKER; Left Eye (H35.372) 3.  Hyperopia ; Left Eye (H52.02) 4.  DERMATOCHALASIS, no surgery; Right Upper Lid, Left Upper Lid (H02.831, H02.834) 5.  BLEPHARITIS; Right Upper Lid, Right Lower Lid, Left Upper Lid, Left Lower Lid (H01.001, H01.002,H01.004,H01.005)  Plan: 1.  Cataract accounts for the patient's decreased vision. This visual impairment is not correctable with a tolerable change in glasses or contact lenses. Cataract surgery with an implantation of a new lens should significantly improve the visual and functional status of the patient. Discussed all risks, benefits,  alternatives, and potential complications. Discussed the procedures and recovery. Patient desires to have surgery. A-scan ordered and performed today for intra-ocular lens calculations. The surgery will be performed in order to improve vision for driving, reading, and for eye examinations. Recommend phacoemulsification with intra-ocular lens. Recommend Dextenza for post-operative pain and inflammation. Right Eye much worse - first. Dilates well - shugarcaine by protocol. 2.  Asymptomatic. Findings, prognosis and treatment options reviewed. Despite presence of pucker patient would like to observe for now. OCT macula today. Symptomatic. 3.  4.  Asymptomatic, recommend observation for now. Findings, prognosis and treatment options reviewed. 5.  Begin/continue lid scrubs.

## 2020-07-31 ENCOUNTER — Other Ambulatory Visit: Payer: Self-pay | Admitting: Nurse Practitioner

## 2020-08-01 DIAGNOSIS — H25811 Combined forms of age-related cataract, right eye: Secondary | ICD-10-CM | POA: Diagnosis not present

## 2020-08-01 NOTE — Patient Instructions (Signed)
Melissa Wiley  08/01/2020     @PREFPERIOPPHARMACY @   Your procedure is scheduled on  08/05/2020.  Report to 14/11/2019 at  [phone]  A.M.  Call this number if you have problems the morning of surgery:  [phone]   Remember:  Do not eat or drink after midnight.                        Take these medicines the morning of surgery with A SIP OF WATER  Protonix, paxil.    Do not wear jewelry, make-up or nail polish.  Do not wear lotions, powders, or perfumes. Please wear deodorant and brush your teeth.  Do not shave 48 hours prior to surgery.  Men may shave face and neck.  Do not bring valuables to the hospital.  [facility] is not responsible for any belongings or valuables.  Contacts, dentures or bridgework may not be worn into surgery.  Leave your suitcase in the car.  After surgery it may be brought to your room.  For patients admitted to the hospital, discharge time will be determined by your treatment team.  Patients discharged the day of surgery will not be allowed to drive home.   Name and phone number of your driver:   family Special instructions:  DO NOT smoke the morning of your procedure.  Please read over the following fact sheets that you were given. Anesthesia Post-op Instructions and Care and Recovery After Surgery      Cataract Surgery, Care After This sheet gives you information about how to care for yourself after your procedure. Your health care provider may also give you more specific instructions. If you have problems or questions, contact your health care provider. What can I expect after the procedure? After the procedure, it is common to have:  Itching.  Discomfort.  Fluid discharge.  Sensitivity to light and to touch.  Bruising in or around the eye.  Mild blurred vision. Follow these instructions at home: Eye care   Do not touch or rub your eyes.  Protect your eyes as told by your health care provider. You may be told to wear a  protective eye shield or sunglasses.  Do not put a contact lens into the affected eye or eyes until your health care provider approves.  Keep the area around your eye clean and dry: ? Avoid swimming. ? Do not allow water to hit you directly in the face while showering. ? Keep soap and shampoo out of your eyes.  Check your eye every day for signs of infection. Watch for: ? Redness, swelling, or pain. ? Fluid, blood, or pus. ? Warmth. ? A bad smell. ? Vision that is getting worse. ? Sensitivity that is getting worse. Activity  Do not drive for 24 hours if you were given a sedative during your procedure.  Avoid strenuous activities, such as playing contact sports, for as long as told by your health care provider.  Do not drive or use heavy machinery until your health care provider approves.  Do not bend or lift heavy objects. Bending increases pressure in the eye. You can walk, climb stairs, and do light household chores.  Ask your health care provider when you can return to work. If you work in a dusty environment, you may be advised to wear protective eyewear for a period of time. General instructions  Take or apply over-the-counter and prescription medicines only as told by your  health care provider. This includes eye drops.  Keep all follow-up visits as told by your health care provider. This is important. Contact a health care provider if:  You have increased bruising around your eye.  You have pain that is not helped with medicine.  You have a fever.  You have redness, swelling, or pain in your eye.  You have fluid, blood, or pus coming from your incision.  Your vision gets worse.  Your sensitivity to light gets worse. Get help right away if:  You have sudden loss of vision.  You see flashes of light or spots (floaters).  You have severe eye pain.  You develop nausea or vomiting. Summary  After your procedure, it is common to have itching, discomfort,  bruising, fluid discharge, or sensitivity to light.  Follow instructions from your health care provider about caring for your eye after the procedure.  Do not rub your eye after the procedure. You may need to wear eye protection or sunglasses. Do not wear contact lenses. Keep the area around your eye clean and dry.  Avoid activities that require a lot of effort. These include playing sports and lifting heavy objects.  Contact a health care provider if you have increased bruising, pain that does not go away, or a fever. Get help right away if you suddenly lose your vision, see flashes of light or spots, or have severe pain in the eye. This information is not intended to replace advice given to you by your health care provider. Make sure you discuss any questions you have with your health care provider. Document Revised: 06/16/2019 Document Reviewed: 02/17/2018 Elsevier Patient Education  [employer].

## 2020-08-03 ENCOUNTER — Encounter (HOSPITAL_COMMUNITY)
Admission: RE | Admit: 2020-08-03 | Discharge: 2020-08-03 | Disposition: A | Discharge status: Home / Self Care | Payer: Medicare HMO | Source: Ambulatory Visit | Attending: Ophthalmology | Admitting: Ophthalmology

## 2020-08-03 ENCOUNTER — Other Ambulatory Visit: Payer: Self-pay

## 2020-08-03 ENCOUNTER — Other Ambulatory Visit (HOSPITAL_COMMUNITY)
Admission: RE | Admit: 2020-08-03 | Discharge: 2020-08-03 | Disposition: A | Discharge status: Home / Self Care | Payer: Medicare HMO | Source: Ambulatory Visit | Attending: Ophthalmology | Admitting: Ophthalmology

## 2020-08-03 DIAGNOSIS — Z01818 Encounter for other preprocedural examination: Secondary | ICD-10-CM | POA: Diagnosis not present

## 2020-08-03 DIAGNOSIS — Z20822 Contact with and (suspected) exposure to covid-19: Secondary | ICD-10-CM | POA: Insufficient documentation

## 2020-08-03 DIAGNOSIS — Z01812 Encounter for preprocedural laboratory examination: Secondary | ICD-10-CM | POA: Insufficient documentation

## 2020-08-03 LAB — BASIC METABOLIC PANEL
Anion gap: 9 (ref 5–15)
BUN: 9 mg/dL (ref 8–23)
CO2: 27 mmol/L (ref 22–32)
Calcium: 8.7 mg/dL — ABNORMAL LOW (ref 8.9–10.3)
Chloride: 103 mmol/L (ref 98–111)
Creatinine, Ser: 0.66 mg/dL (ref 0.44–1.00)
GFR, Estimated: >60 mL/min (ref >60)
Glucose, Bld: 91 mg/dL (ref 70–99)
Potassium: 3.7 mmol/L (ref 3.5–5.1)
Sodium: 139 mmol/L (ref 135–145)

## 2020-08-03 LAB — HEMOGLOBIN A1C
Hgb A1c MFr Bld: 5.7 % — ABNORMAL HIGH (ref 4.8–5.6)
Mean Plasma Glucose: 116.89 mg/dL

## 2020-08-03 LAB — SARS CORONAVIRUS 2 (TAT 6-24 HRS): SARS Coronavirus 2: NEGATIVE

## 2020-08-05 ENCOUNTER — Ambulatory Visit (HOSPITAL_COMMUNITY)
Admission: RE | Admit: 2020-08-05 | Discharge: 2020-08-05 | Disposition: A | Discharge status: Home / Self Care | Payer: Medicare HMO | Attending: Ophthalmology | Admitting: Ophthalmology

## 2020-08-05 ENCOUNTER — Ambulatory Visit (HOSPITAL_COMMUNITY): Payer: Medicare HMO | Admitting: Certified Registered Nurse Anesthetist

## 2020-08-05 ENCOUNTER — Encounter (HOSPITAL_COMMUNITY)
Admission: RE | Disposition: A | Discharge status: Home / Self Care | Payer: Self-pay | Source: Home / Self Care | Attending: Ophthalmology

## 2020-08-05 ENCOUNTER — Encounter (HOSPITAL_COMMUNITY): Payer: Self-pay | Admitting: Ophthalmology

## 2020-08-05 DIAGNOSIS — Z79899 Other long term (current) drug therapy: Secondary | ICD-10-CM | POA: Insufficient documentation

## 2020-08-05 DIAGNOSIS — H25811 Combined forms of age-related cataract, right eye: Secondary | ICD-10-CM | POA: Insufficient documentation

## 2020-08-05 HISTORY — PX: CATARACT EXTRACTION W/PHACO: SHX586

## 2020-08-05 SURGERY — PHACOEMULSIFICATION, CATARACT, WITH IOL INSERTION
Anesthesia: Monitor Anesthesia Care | Site: Eye | Laterality: Right

## 2020-08-05 MED ORDER — BSS IO SOLN
INTRAOCULAR | Status: DC | PRN
  Administered 2020-08-05: 15 mL via INTRAOCULAR

## 2020-08-05 MED ORDER — TETRACAINE HCL 0.5 % OP SOLN
1.0000 [drp] | OPHTHALMIC | Status: AC | PRN
  Administered 2020-08-05 (×3): 1 [drp] via OPHTHALMIC

## 2020-08-05 MED ORDER — PROVISC 10 MG/ML IO SOLN
INTRAOCULAR | Status: DC | PRN
  Administered 2020-08-05: 0.85 mL via INTRAOCULAR

## 2020-08-05 MED ORDER — LIDOCAINE HCL (PF) 1 % IJ SOLN
INTRAOCULAR | Status: DC | PRN
  Administered 2020-08-05: 1 mL via OPHTHALMIC

## 2020-08-05 MED ORDER — EPINEPHRINE PF 1 MG/ML IJ SOLN
INTRAOCULAR | Status: DC | PRN
  Administered 2020-08-05: 500 mL

## 2020-08-05 MED ORDER — SODIUM HYALURONATE 23 MG/ML IO SOLN
INTRAOCULAR | Status: DC | PRN
  Administered 2020-08-05: 0.6 mL via INTRAOCULAR

## 2020-08-05 MED ORDER — NEOMYCIN-POLYMYXIN-DEXAMETH 3.5-10000-0.1 OP SUSP
OPHTHALMIC | Status: DC | PRN
  Administered 2020-08-05: 1 [drp] via OPHTHALMIC

## 2020-08-05 MED ORDER — CYCLOPENTOLATE-PHENYLEPHRINE 0.2-1 % OP SOLN
1.0000 [drp] | OPHTHALMIC | Status: AC | PRN
  Administered 2020-08-05 (×3): 1 [drp] via OPHTHALMIC

## 2020-08-05 MED ORDER — POVIDONE-IODINE 5 % OP SOLN
OPHTHALMIC | Status: DC | PRN
  Administered 2020-08-05: 1 via OPHTHALMIC

## 2020-08-05 MED ORDER — EPINEPHRINE PF 1 MG/ML IJ SOLN
INTRAMUSCULAR | Status: AC
  Filled 2020-08-05: qty 2

## 2020-08-05 MED ORDER — PHENYLEPHRINE HCL 2.5 % OP SOLN
1.0000 [drp] | OPHTHALMIC | Status: AC | PRN
  Administered 2020-08-05 (×3): 1 [drp] via OPHTHALMIC

## 2020-08-05 MED ORDER — STERILE WATER FOR IRRIGATION IR SOLN
Status: DC | PRN
  Administered 2020-08-05: 250 mL

## 2020-08-05 MED ORDER — LIDOCAINE HCL 3.5 % OP GEL
1.0000 "application " | Freq: Once | OPHTHALMIC | Status: AC
  Administered 2020-08-05: 1 via OPHTHALMIC

## 2020-08-05 SURGICAL SUPPLY — 15 items
CLOTH BEACON ORANGE TIMEOUT ST (SAFETY) ×1 IMPLANT
EYE SHIELD UNIVERSAL CLEAR (GAUZE/BANDAGES/DRESSINGS) ×1 IMPLANT
GLOVE BIOGEL PI IND STRL 7.0 (GLOVE) IMPLANT
GLOVE BIOGEL PI INDICATOR 7.0 (GLOVE) ×2
GOWN STRL REUS W/ TWL XL LVL3 (GOWN DISPOSABLE) IMPLANT
GOWN STRL REUS W/TWL XL LVL3 (GOWN DISPOSABLE) ×2
NDL HYPO 18GX1.5 BLUNT FILL (NEEDLE) IMPLANT
NEEDLE HYPO 18GX1.5 BLUNT FILL (NEEDLE) ×2 IMPLANT
PAD ARMBOARD 7.5X6 YLW CONV (MISCELLANEOUS) ×1 IMPLANT
SYR TB 1ML LL NO SAFETY (SYRINGE) ×1 IMPLANT
TAPE SURG TRANSPORE 1 IN (GAUZE/BANDAGES/DRESSINGS) IMPLANT
TAPE SURGICAL TRANSPORE 1 IN (GAUZE/BANDAGES/DRESSINGS) ×2
TECNIS IOL (Intraocular Lens) ×1 IMPLANT
VISCOELASTIC ADDITIONAL (OPHTHALMIC RELATED) IMPLANT
WATER STERILE IRR 250ML POUR (IV SOLUTION) ×1 IMPLANT

## 2020-08-05 NOTE — Discharge Instructions (Signed)
Please discharge patient when stable, will follow up today with Dr. Kinnedy Mongiello at the [facility] [place] office immediately following discharge.  Leave shield in place until visit.  All paperwork with discharge instructions will be given at the office.  [facility] Address:  [address]  [place], [place] [address]  

## 2020-08-05 NOTE — Interval H&P Note (Signed)
History and Physical Interval Note:  08/05/2020 7:56 AM  Melissa Wiley  has presented today for surgery, with the diagnosis of Nuclear sclerotic cataract - Right eye.  The various methods of treatment have been discussed with the patient and family. After consideration of risks, benefits and other options for treatment, the patient has consented to  Procedure(s) with comments: CATARACT EXTRACTION PHACO AND INTRAOCULAR LENS PLACEMENT (IOC) (Right) - CDE: as a surgical intervention.  The patient's history has been reviewed, patient examined, no change in status, stable for surgery.  I have reviewed the patient's chart and labs.  Questions were answered to the patient's satisfaction.     Fabio Pierce

## 2020-08-05 NOTE — Anesthesia Preprocedure Evaluation (Signed)
Anesthesia Evaluation  °Patient identified by MRN, date of birth, ID band °Patient awake ° ° ° °Reviewed: °Allergy & Precautions, H&P , NPO status , Patient's Chart, lab work & pertinent test results, reviewed documented beta blocker date and time  ° °Airway °Mallampati: II ° °TM Distance: >3 FB °Neck ROM: full ° ° ° Dental °no notable dental hx. ° °  °Pulmonary °neg pulmonary ROS,  °  °Pulmonary exam normal °breath sounds clear to auscultation ° ° ° ° ° ° Cardiovascular °Exercise Tolerance: Good °hypertension, negative cardio ROS ° ° °Rhythm:regular Rate:Normal ° ° °  °Neuro/Psych ° Headaches, Seizures -, Well Controlled,  negative psych ROS  ° GI/Hepatic °Neg liver ROS, GERD  Medicated,  °Endo/Other  °negative endocrine ROS ° Renal/GU °negative Renal ROS  °negative genitourinary °  °Musculoskeletal ° ° Abdominal °  °Peds ° Hematology ° °(+) Blood dyscrasia, anemia ,   °Anesthesia Other Findings ° ° Reproductive/Obstetrics °negative OB ROS ° °  ° ° ° ° ° ° ° ° ° ° ° ° ° °  °  ° ° ° ° ° ° ° ° °Anesthesia Physical ° °Anesthesia Plan ° °ASA: II ° °Anesthesia Plan: MAC  ° °Post-op Pain Management:   ° °Induction:  ° °PONV Risk Score and Plan:  ° °Airway Management Planned:  ° °Additional Equipment:  ° °Intra-op Plan:  ° °Post-operative Plan:  ° °Informed Consent: I have reviewed the patients History and Physical, chart, labs and discussed the procedure including the risks, benefits and alternatives for the proposed anesthesia with the patient or authorized representative who has indicated his/her understanding and acceptance.  ° ° ° °Dental Advisory Given ° °Plan Discussed with: CRNA ° °Anesthesia Plan Comments:   ° ° ° ° ° ° °Anesthesia Quick Evaluation ° °

## 2020-08-05 NOTE — Addendum Note (Signed)
Addendum  created 08/05/20 1107 by Chelsea Aus, CRNA   Charge Capture section accepted

## 2020-08-05 NOTE — Transfer of Care (Signed)
Immediate Anesthesia Transfer of Care Note  Patient: Melissa Wiley  Procedure(s) Performed: CATARACT EXTRACTION PHACO AND INTRAOCULAR LENS PLACEMENT (IOC) (Right Eye)  Patient Location: PACU and Short Stay  Anesthesia Type:MAC  Level of Consciousness: awake, alert  and oriented  Airway & Oxygen Therapy: Patient Spontanous Breathing  Post-op Assessment: Report given to RN and Post -op Vital signs reviewed and stable  Post vital signs: Reviewed and stable  Last Vitals:  Vitals Value Taken Time  BP    Temp    Pulse    Resp    SpO2      Last Pain:  Vitals:   08/05/20 0731  TempSrc: Oral  PainSc: 0-No pain      Patients Stated Pain Goal: 6 (72/55/00 1642)  Complications: No complications documented.

## 2020-08-05 NOTE — Op Note (Signed)
Date of procedure: 08/05/20  Pre-operative diagnosis:  Visually significant combined form age-related cataract, Right Eye (H25.811)  Post-operative diagnosis:  Visually significant combined form age-related cataract, Right Eye (H25.811)  Procedure: Removal of cataract via phacoemulsification and insertion of intra-ocular lens Wynetta Emery and [employer] [id number]  +19.0D into the capsular bag of the Right Eye  Attending surgeon: Gerda Diss. Vonya Ohalloran, MD, MA  Anesthesia: MAC, Topical Akten  Complications: None  Estimated Blood Loss: <59m (minimal)  Specimens: None  Implants: As above  Indications:  Visually significant age-related cataract, Right Eye  Procedure:  The patient was seen and identified in the pre-operative area. The operative eye was identified and dilated.  The operative eye was marked.  Topical anesthesia was administered to the operative eye.     The patient was then to the operative suite and placed in the supine position.  A timeout was performed confirming the patient, procedure to be performed, and all other relevant information.   The patient's face was prepped and draped in the usual fashion for intra-ocular surgery.  A lid speculum was placed into the operative eye and the surgical microscope moved into place and focused.  A superotemporal paracentesis was created using a 20 gauge paracentesis blade.  Shugarcaine was injected into the anterior chamber.  Viscoelastic was injected into the anterior chamber.  A temporal clear-corneal main wound incision was created using a 2.491mmicrokeratome.  A continuous curvilinear capsulorrhexis was initiated using an irrigating cystitome and completed using capsulorrhexis forceps.  Hydrodissection and hydrodeliniation were performed.  Viscoelastic was injected into the anterior chamber.  A phacoemulsification handpiece and a chopper as a second instrument were used to remove the nucleus and epinucleus. The irrigation/aspiration handpiece was  used to remove any remaining cortical material.   The capsular bag was reinflated with viscoelastic, checked, and found to be intact.  The intraocular lens was inserted into the capsular bag.  The irrigation/aspiration handpiece was used to remove any remaining viscoelastic.  The clear corneal wound and paracentesis wounds were then hydrated and checked with Weck-Cels to be watertight.  The lid-speculum was removed.  The drape was removed.  The patient's face was cleaned with a wet and dry 4x4.   Maxitrol was instilled in the eye. A clear shield was taped over the eye. The patient was taken to the post-operative care unit in good condition, having tolerated the procedure well.  Post-Op Instructions: [facility] and will receive all other orders and instructions.

## 2020-08-05 NOTE — Anesthesia Postprocedure Evaluation (Signed)
Anesthesia Post Note  Patient: Melissa Wiley  Procedure(s) Performed: CATARACT EXTRACTION PHACO AND INTRAOCULAR LENS PLACEMENT (IOC) (Right Eye)  Patient location during evaluation: Short Stay Anesthesia Type: MAC Level of consciousness: awake and alert Pain management: pain level controlled Vital Signs Assessment: post-procedure vital signs reviewed and stable Respiratory status: spontaneous breathing Cardiovascular status: blood pressure returned to baseline and stable Postop Assessment: no apparent nausea or vomiting Anesthetic complications: no   No complications documented.   Last Vitals:  Vitals:   08/05/20 0731 08/05/20 0827  BP: (!) 145/67 (!) 150/81  Pulse: 67 78  Resp: 19 18  Temp: 37.1 C 37 C  SpO2: 100% 98%    Last Pain:  Vitals:   08/05/20 0827  TempSrc: Oral  PainSc: 0-No pain                 Abagayle Klutts

## 2020-08-08 ENCOUNTER — Encounter (HOSPITAL_COMMUNITY): Payer: Self-pay | Admitting: Ophthalmology

## 2020-08-12 NOTE — H&P (Signed)
Surgical History & Physical  Patient Name: Melissa Wiley DOB: [birth year]  Surgery: Cataract extraction with intraocular lens implant phacoemulsification; Left Eye  Surgeon: Fabio Pierce MD Surgery Date:  08/19/2020 Pre-Op Date:  08/11/2020  HPI: A 51 Yr. old female patient The patient is returning after cataract surgery. The right eye is affected. Status post cataract surgery, which began 1 months ago: Since the last visit, the affected area feels improvement and is doing well. The patient's vision is improved and stable. Patient is following medication instructions. Omni TID OD. The patient experiences no increase floaters. The patient complains of difficulty when driving/ Difficulty in viewing TV., which began 1 year ago. The left eye is affected. The episode is gradual. The condition's severity increased since last visit. Symptoms occur when the patient is driving, inside and outside. This is negatively affecting her quality of life. HPI was performed by Fabio Pierce .  Medical History: Cataracts OD: PVD Anxiety disorder Arthritis High Blood Pressure  Review of Systems Allergic/Immunologic Seasonal Allergies All recorded systems are negative except as noted above.  Social   Never smoked   Medication Prednisolone-gatiflox-bromfenac,  Atenolol, Enalapril, Hydrochlorothiazide, Paxil, Xanax,   Sx/Procedures Phaco c IOL OD,  C Section, Colonoscopy,   Drug Allergies   NKDA  History & Physical: Heent:  Cataract, Left eye NECK: supple without bruits LUNGS: lungs clear to auscultation CV: regular rate and rhythm Abdomen: soft and non-tender  Impression & Plan: Assessment: 1.  CATARACT EXTRACTION STATUS; Right Eye (Z98.41) 2.  INTRAOCULAR LENS IOL (Z96.1) 3.  COMBINED FORMS AGE RELATED CATARACT; Left Eye (H25.812)  Plan: 1.  1 week after cataract surgery. Doing well with improved vision and normal eye pressure. Call with any problems or concerns. Continue Gati-Brom-Pred  2x/day for 3 more weeks. 2.  Doing well since surgery Continue Post-op medications 3.  Cataract accounts for the patient's decreased vision. This visual impairment is not correctable with a tolerable change in glasses or contact lenses. Cataract surgery with an implantation of a new lens should significantly improve the visual and functional status of the patient. Discussed all risks, benefits, alternatives, and potential complications. Discussed the procedures and recovery. Patient desires to have surgery. A-scan ordered and performed today for intra-ocular lens calculations. The surgery will be performed in order to improve vision for driving, reading, and for eye examinations. Recommend phacoemulsification with intra-ocular lens. Recommend Dextenza for post-operative pain and inflammation. Left Eye. Surgery required to correct imbalance of vision. Dilates well - shugarcaine by protocol.

## 2020-08-15 ENCOUNTER — Other Ambulatory Visit: Payer: Self-pay

## 2020-08-15 ENCOUNTER — Encounter (HOSPITAL_COMMUNITY)
Admission: RE | Admit: 2020-08-15 | Discharge: 2020-08-15 | Disposition: A | Discharge status: Home / Self Care | Payer: Medicare HMO | Source: Ambulatory Visit | Attending: Ophthalmology | Admitting: Ophthalmology

## 2020-08-15 ENCOUNTER — Encounter (HOSPITAL_COMMUNITY): Payer: Self-pay

## 2020-08-15 DIAGNOSIS — H25812 Combined forms of age-related cataract, left eye: Secondary | ICD-10-CM | POA: Diagnosis not present

## 2020-08-17 ENCOUNTER — Other Ambulatory Visit: Payer: Self-pay

## 2020-08-17 ENCOUNTER — Other Ambulatory Visit (HOSPITAL_COMMUNITY)
Admission: RE | Admit: 2020-08-17 | Discharge: 2020-08-17 | Disposition: A | Discharge status: Home / Self Care | Payer: Medicare HMO | Source: Ambulatory Visit | Attending: Ophthalmology | Admitting: Ophthalmology

## 2020-08-17 DIAGNOSIS — Z20822 Contact with and (suspected) exposure to covid-19: Secondary | ICD-10-CM | POA: Diagnosis not present

## 2020-08-17 DIAGNOSIS — Z01812 Encounter for preprocedural laboratory examination: Secondary | ICD-10-CM | POA: Diagnosis not present

## 2020-08-17 LAB — SARS CORONAVIRUS 2 (TAT 6-24 HRS): SARS Coronavirus 2: NEGATIVE

## 2020-08-19 ENCOUNTER — Ambulatory Visit (HOSPITAL_COMMUNITY): Payer: Medicare HMO | Admitting: Anesthesiology

## 2020-08-19 ENCOUNTER — Encounter (HOSPITAL_COMMUNITY): Payer: Self-pay | Admitting: Ophthalmology

## 2020-08-19 ENCOUNTER — Other Ambulatory Visit: Payer: Self-pay

## 2020-08-19 ENCOUNTER — Ambulatory Visit (HOSPITAL_COMMUNITY)
Admission: RE | Admit: 2020-08-19 | Discharge: 2020-08-19 | Disposition: A | Discharge status: Home / Self Care | Payer: Medicare HMO | Attending: Ophthalmology | Admitting: Ophthalmology

## 2020-08-19 ENCOUNTER — Encounter (HOSPITAL_COMMUNITY)
Admission: RE | Disposition: A | Discharge status: Home / Self Care | Payer: Self-pay | Source: Home / Self Care | Attending: Ophthalmology

## 2020-08-19 DIAGNOSIS — Z961 Presence of intraocular lens: Secondary | ICD-10-CM | POA: Diagnosis not present

## 2020-08-19 DIAGNOSIS — H25812 Combined forms of age-related cataract, left eye: Secondary | ICD-10-CM | POA: Diagnosis not present

## 2020-08-19 DIAGNOSIS — Z79899 Other long term (current) drug therapy: Secondary | ICD-10-CM | POA: Insufficient documentation

## 2020-08-19 DIAGNOSIS — Z9841 Cataract extraction status, right eye: Secondary | ICD-10-CM | POA: Diagnosis not present

## 2020-08-19 HISTORY — PX: CATARACT EXTRACTION W/PHACO: SHX586

## 2020-08-19 LAB — GLUCOSE, CAPILLARY: Glucose-Capillary: 99 mg/dL (ref 70–99)

## 2020-08-19 SURGERY — PHACOEMULSIFICATION, CATARACT, WITH IOL INSERTION
Anesthesia: Monitor Anesthesia Care | Site: Eye | Laterality: Left

## 2020-08-19 MED ORDER — LACTATED RINGERS IV SOLN: INTRAVENOUS | Status: DC

## 2020-08-19 MED ORDER — SODIUM HYALURONATE 23 MG/ML IO SOLN
INTRAOCULAR | Status: DC | PRN
  Administered 2020-08-19: 0.6 mL via INTRAOCULAR

## 2020-08-19 MED ORDER — EPINEPHRINE PF 1 MG/ML IJ SOLN
INTRAOCULAR | Status: DC | PRN
  Administered 2020-08-19: 500 mL

## 2020-08-19 MED ORDER — POVIDONE-IODINE 5 % OP SOLN
OPHTHALMIC | Status: DC | PRN
  Administered 2020-08-19: 1 via OPHTHALMIC

## 2020-08-19 MED ORDER — TROPICAMIDE 1 % OP SOLN
1.0000 [drp] | OPHTHALMIC | Status: AC
  Administered 2020-08-19 (×3): 1 [drp] via OPHTHALMIC

## 2020-08-19 MED ORDER — NEOMYCIN-POLYMYXIN-DEXAMETH 3.5-10000-0.1 OP SUSP
OPHTHALMIC | Status: DC | PRN
  Administered 2020-08-19: 1 [drp] via OPHTHALMIC

## 2020-08-19 MED ORDER — ARMC OPHTHALMIC DILATING DROPS: 1.0000 "application " | OPHTHALMIC | Status: DC

## 2020-08-19 MED ORDER — TETRACAINE HCL 0.5 % OP SOLN
1.0000 [drp] | OPHTHALMIC | Status: AC | PRN
  Administered 2020-08-19 (×3): 1 [drp] via OPHTHALMIC

## 2020-08-19 MED ORDER — STERILE WATER FOR IRRIGATION IR SOLN
Status: DC | PRN
  Administered 2020-08-19: 250 mL

## 2020-08-19 MED ORDER — EPINEPHRINE PF 1 MG/ML IJ SOLN
INTRAMUSCULAR | Status: AC
  Filled 2020-08-19: qty 2

## 2020-08-19 MED ORDER — PHENYLEPHRINE HCL 2.5 % OP SOLN
1.0000 [drp] | OPHTHALMIC | Status: AC | PRN
  Administered 2020-08-19 (×3): 1 [drp] via OPHTHALMIC

## 2020-08-19 MED ORDER — LIDOCAINE HCL (PF) 1 % IJ SOLN
INTRAOCULAR | Status: DC | PRN
  Administered 2020-08-19: 1 mL via OPHTHALMIC

## 2020-08-19 MED ORDER — PROVISC 10 MG/ML IO SOLN
INTRAOCULAR | Status: DC | PRN
  Administered 2020-08-19: 0.85 mL via INTRAOCULAR

## 2020-08-19 MED ORDER — CYCLOPENTOLATE-PHENYLEPHRINE 0.2-1 % OP SOLN: 1.0000 [drp] | OPHTHALMIC | Status: DC | PRN

## 2020-08-19 MED ORDER — BSS IO SOLN
INTRAOCULAR | Status: DC | PRN
  Administered 2020-08-19: 15 mL via INTRAOCULAR

## 2020-08-19 MED ORDER — LIDOCAINE HCL 3.5 % OP GEL
1.0000 "application " | Freq: Once | OPHTHALMIC | Status: AC
  Administered 2020-08-19: 1 via OPHTHALMIC

## 2020-08-19 SURGICAL SUPPLY — 17 items
CLOTH BEACON ORANGE TIMEOUT ST (SAFETY) ×1 IMPLANT
DEVICE MILOOP (MISCELLANEOUS) IMPLANT
EYE SHIELD UNIVERSAL CLEAR (GAUZE/BANDAGES/DRESSINGS) ×1 IMPLANT
GLOVE BIOGEL PI IND STRL 7.0 (GLOVE) IMPLANT
GLOVE BIOGEL PI INDICATOR 7.0 (GLOVE) ×3
MILOOP DEVICE (MISCELLANEOUS)
NDL HYPO 18GX1.5 BLUNT FILL (NEEDLE) IMPLANT
NEEDLE HYPO 18GX1.5 BLUNT FILL (NEEDLE) ×2 IMPLANT
PAD ARMBOARD 7.5X6 YLW CONV (MISCELLANEOUS) ×1 IMPLANT
RING MALYGIN (MISCELLANEOUS) IMPLANT
RING MALYGIN 7.0 (MISCELLANEOUS) IMPLANT
SYR TB 1ML LL NO SAFETY (SYRINGE) ×1 IMPLANT
TAPE SURG TRANSPORE 1 IN (GAUZE/BANDAGES/DRESSINGS) IMPLANT
TAPE SURGICAL TRANSPORE 1 IN (GAUZE/BANDAGES/DRESSINGS) ×2
TECNIS 1 PIECE INTRAOCULAR LENS (Intraocular Lens) ×1 IMPLANT
VISCOELASTIC ADDITIONAL (OPHTHALMIC RELATED) IMPLANT
WATER STERILE IRR 250ML POUR (IV SOLUTION) ×1 IMPLANT

## 2020-08-19 NOTE — Anesthesia Postprocedure Evaluation (Signed)
Anesthesia Post Note  Patient: Melissa Wiley  Procedure(s) Performed: CATARACT EXTRACTION PHACO AND INTRAOCULAR LENS PLACEMENT (IOC) (Left Eye)  Patient location during evaluation: PACU Anesthesia Type: MAC Level of consciousness: awake, oriented and patient cooperative Pain management: satisfactory to patient Vital Signs Assessment: post-procedure vital signs reviewed and stable Respiratory status: spontaneous breathing Cardiovascular status: stable Postop Assessment: no apparent nausea or vomiting Anesthetic complications: no   No complications documented.   Last Vitals:  Vitals:   08/19/20 1022 08/19/20 1215  BP: (!) 150/72 139/79  Pulse: 74 78  Resp: 15 16  Temp: 36.5 C 36.4 C  SpO2: 97% 100%    Last Pain:  Vitals:   08/19/20 1215  TempSrc: Oral  PainSc: 0-No pain                 [name]

## 2020-08-19 NOTE — Discharge Instructions (Addendum)
Please discharge patient when stable, will follow up today with Dr. Wrzosek at the [facility] [place] office immediately following discharge.  Leave shield in place until visit.  All paperwork with discharge instructions will be given at the office.  [facility] Address:  [address]  [place],  [address]  

## 2020-08-19 NOTE — Interval H&P Note (Signed)
History and Physical Interval Note:  08/19/2020 11:48 AM  Melissa Wiley  has presented today for surgery, with the diagnosis of Nuclear sclerotic cataract - Left eye.  The various methods of treatment have been discussed with the patient and family. After consideration of risks, benefits and other options for treatment, the patient has consented to  Procedure(s) with comments: CATARACT EXTRACTION PHACO AND INTRAOCULAR LENS PLACEMENT (IOC) (Left) - left as a surgical intervention.  The patient's history has been reviewed, patient examined, no change in status, stable for surgery.  I have reviewed the patient's chart and labs.  Questions were answered to the patient's satisfaction.     Fabio Pierce

## 2020-08-19 NOTE — Transfer of Care (Signed)
Immediate Anesthesia Transfer of Care Note  Patient: Melissa Wiley  Procedure(s) Performed: CATARACT EXTRACTION PHACO AND INTRAOCULAR LENS PLACEMENT (IOC) (Left Eye)  Patient Location: PACU  Anesthesia Type:MAC  Level of Consciousness: awake, alert , oriented and patient cooperative  Airway & Oxygen Therapy: Patient Spontanous Breathing  Post-op Assessment: Report given to RN, Post -op Vital signs reviewed and stable and Patient moving all extremities X 4  Post vital signs: Reviewed and stable  Last Vitals:  Vitals Value Taken Time  BP 139/79 08/19/20 1215  Temp 36.4 C 08/19/20 1215  Pulse 78 08/19/20 1215  Resp 16 08/19/20 1215  SpO2 100 % 08/19/20 1215    Last Pain:  Vitals:   08/19/20 1215  TempSrc: Oral  PainSc: 0-No pain         Complications: No complications documented.

## 2020-08-19 NOTE — Anesthesia Preprocedure Evaluation (Signed)
Anesthesia Evaluation  Patient identified by MRN, date of birth, ID band Patient awake    Reviewed: Allergy & Precautions, H&P , NPO status , Patient's Chart, lab work & pertinent test results, reviewed documented beta blocker date and time   Airway Mallampati: II  TM Distance: >3 FB Neck ROM: full    Dental no notable dental hx.    Pulmonary neg pulmonary ROS,    Pulmonary exam normal breath sounds clear to auscultation       Cardiovascular Exercise Tolerance: Good hypertension, negative cardio ROS   Rhythm:regular Rate:Normal     Neuro/Psych  Headaches, Seizures -, Well Controlled,  negative psych ROS   GI/Hepatic Neg liver ROS, GERD  Medicated,  Endo/Other  negative endocrine ROS  Renal/GU negative Renal ROS  negative genitourinary   Musculoskeletal   Abdominal   Peds  Hematology  (+) Blood dyscrasia, anemia ,   Anesthesia Other Findings   Reproductive/Obstetrics negative OB ROS                             Anesthesia Physical  Anesthesia Plan  ASA: II  Anesthesia Plan: MAC   Post-op Pain Management:    Induction:   PONV Risk Score and Plan:   Airway Management Planned:   Additional Equipment:   Intra-op Plan:   Post-operative Plan:   Informed Consent: I have reviewed the patients History and Physical, chart, labs and discussed the procedure including the risks, benefits and alternatives for the proposed anesthesia with the patient or authorized representative who has indicated his/her understanding and acceptance.     Dental Advisory Given  Plan Discussed with: CRNA  Anesthesia Plan Comments:         Anesthesia Quick Evaluation

## 2020-08-19 NOTE — Op Note (Signed)
Date of procedure: 08/19/20  Pre-operative diagnosis: Visually significant age-related combined cataract, Left Eye (H25.812)  Post-operative diagnosis: Visually significant age-related combined cataract, Left Eye (H25.812)  Procedure: Removal of cataract via phacoemulsification and insertion of intra-ocular lens Wynetta Emery and [place]  +18.5D into the capsular bag of the Left Eye  Attending surgeon: Gerda Diss. Caeson Filippi, MD, MA  Anesthesia: MAC, Topical [place]  Complications: None  Estimated Blood Loss: <49m (minimal)  Specimens: None  Implants: As above  Indications:  Visually significant age-related cataract, Left Eye  Procedure:  The patient was seen and identified in the pre-operative area. The operative eye was identified and dilated.  The operative eye was marked.  Topical anesthesia was administered to the operative eye.     The patient was then to the operative suite and placed in the supine position.  A timeout was performed confirming the patient, procedure to be performed, and all other relevant information.   The patient's face was prepped and draped in the usual fashion for intra-ocular surgery.  A lid speculum was placed into the operative eye and the surgical microscope moved into place and focused.  An inferotemporal paracentesis was created using a 20 gauge paracentesis blade.  Shugarcaine was injected into the anterior chamber.  Viscoelastic was injected into the anterior chamber.  A temporal clear-corneal main wound incision was created using a 2.465mmicrokeratome.  A continuous curvilinear capsulorrhexis was initiated using an irrigating cystitome and completed using capsulorrhexis forceps.  Hydrodissection and hydrodeliniation were performed.  Viscoelastic was injected into the anterior chamber.  A phacoemulsification handpiece and a chopper as a second instrument were used to remove the nucleus and epinucleus. The irrigation/aspiration handpiece was used to remove  any remaining cortical material.   The capsular bag was reinflated with viscoelastic, checked, and found to be intact.  The intraocular lens was inserted into the capsular bag.  The irrigation/aspiration handpiece was used to remove any remaining viscoelastic.  The clear corneal wound and paracentesis wounds were then hydrated and checked with Weck-Cels to be watertight.  The lid-speculum was removed.  The drape was removed.  The patient's face was cleaned with a wet and dry 4x4.   Maxitrol was instilled in the eye. A clear shield was taped over the eye. The patient was taken to the post-operative care unit in good condition, having tolerated the procedure well.  Post-Op Instructions: [facility] and will receive all other orders and instructions.\

## 2020-08-21 ENCOUNTER — Other Ambulatory Visit: Payer: Self-pay | Admitting: Family Medicine

## 2020-08-22 ENCOUNTER — Encounter (HOSPITAL_COMMUNITY): Payer: Self-pay | Admitting: Ophthalmology

## 2020-08-31 ENCOUNTER — Other Ambulatory Visit: Payer: Self-pay | Admitting: Family Medicine

## 2020-09-01 ENCOUNTER — Other Ambulatory Visit: Payer: Self-pay | Admitting: Family Medicine

## 2020-09-01 NOTE — Telephone Encounter (Signed)
Patient is requesting refill on paxil 20 mg,she states completely out. Last seen 07/08/20 with Eber Jones ,last filled 04/13/20

## 2020-09-01 NOTE — Telephone Encounter (Signed)
Pt called and notified

## 2020-10-20 ENCOUNTER — Other Ambulatory Visit (HOSPITAL_COMMUNITY): Payer: Self-pay | Admitting: Point of Service

## 2020-10-20 ENCOUNTER — Other Ambulatory Visit (HOSPITAL_COMMUNITY): Payer: Self-pay | Admitting: Nurse Practitioner

## 2020-10-20 DIAGNOSIS — Z1231 Encounter for screening mammogram for malignant neoplasm of breast: Secondary | ICD-10-CM

## 2020-11-11 ENCOUNTER — Other Ambulatory Visit: Payer: Self-pay

## 2020-11-11 ENCOUNTER — Encounter: Payer: Self-pay | Admitting: Nurse Practitioner

## 2020-11-11 ENCOUNTER — Telehealth: Payer: Self-pay | Admitting: Nurse Practitioner

## 2020-11-11 ENCOUNTER — Telehealth (INDEPENDENT_AMBULATORY_CARE_PROVIDER_SITE_OTHER): Payer: Medicare HMO | Admitting: Nurse Practitioner

## 2020-11-11 DIAGNOSIS — I10 Essential (primary) hypertension: Secondary | ICD-10-CM

## 2020-11-11 DIAGNOSIS — R7303 Prediabetes: Secondary | ICD-10-CM | POA: Diagnosis not present

## 2020-11-11 DIAGNOSIS — G4709 Other insomnia: Secondary | ICD-10-CM

## 2020-11-11 DIAGNOSIS — F32A Depression, unspecified: Secondary | ICD-10-CM | POA: Diagnosis not present

## 2020-11-11 DIAGNOSIS — F419 Anxiety disorder, unspecified: Secondary | ICD-10-CM | POA: Diagnosis not present

## 2020-11-11 DIAGNOSIS — R69 Illness, unspecified: Secondary | ICD-10-CM | POA: Diagnosis not present

## 2020-11-11 DIAGNOSIS — M810 Age-related osteoporosis without current pathological fracture: Secondary | ICD-10-CM

## 2020-11-11 MED ORDER — HYDROCHLOROTHIAZIDE 25 MG PO TABS: ORAL_TABLET | ORAL | 0 refills | Status: DC

## 2020-11-11 MED ORDER — PAROXETINE HCL 20 MG PO TABS: ORAL_TABLET | ORAL | 0 refills | Status: DC

## 2020-11-11 MED ORDER — ENALAPRIL MALEATE 20 MG PO TABS: 20.0000 mg | ORAL_TABLET | Freq: Every day | ORAL | 0 refills | Status: DC

## 2020-11-11 MED ORDER — ALPRAZOLAM 0.5 MG PO TABS: ORAL_TABLET | ORAL | 2 refills | Status: DC

## 2020-11-11 NOTE — Progress Notes (Signed)
Subjective:    Patient ID: Melissa Wiley, female    DOB: 08/06/1951, 70 y.o.   MRN: [id number]  HPI Pt here for medication check. Pt states she is out of Xanax and has been out for a week. Pt is taking one each night and sometimes depending on how she feels she will take 2. Pt would like number increased from 30 to 45 tablets. Pt not taking Fosamax because she read some reactions that she did not like.   Virtual Visit via Telephone Note  I connected with [name] on 11/11/20 at  1:20 PM EST by telephone and verified that I am speaking with the correct person using two identifiers.  Location: Patient: home Provider: office   I discussed the limitations, risks, security and privacy concerns of performing an evaluation and management service by telephone and the availability of in person appointments. I also discussed with the patient that there may be a patient responsible charge related to this service. The patient expressed understanding and agreed to proceed.   History of Present Illness: Presents for routine follow-up.  Ran out of her Xanax about a week ago, requesting refills.  Takes 1 at bedtime.  May take 1/2-1 during the day with extreme anxiety, requesting a slight increase in her prescription to 45/month.  Doing well on Paxil. States her BP has been slightly elevated lately which she relates to her stress and difficulty sleeping.  No specific numbers given.  States her current weight this morning is 178 pounds.  Patient has been trying to eat healthier and lose weight. Has some concerns about starting Fosamax based on the package insert given by pharmacy as well as something she read online.  Note that her GERD has been stable. Was also concerned about her hemoglobin A1c of 5.7, this was discussed.   Observations/Objective: Today's visit was via telephone Physical exam was not possible for this visit Alert, oriented.  Cheerful calm affect.  Assessment and  Plan: Problem List Items Addressed This Visit      Cardiovascular and Mediastinum   Essential hypertension, benign   Relevant Medications   enalapril (VASOTEC) 20 MG tablet   hydrochlorothiazide (HYDRODIURIL) 25 MG tablet     Musculoskeletal and Integument   Osteoporosis     Other   Anxiety and depression - Primary   Relevant Medications   PARoxetine (PAXIL) 20 MG tablet   ALPRAZolam (XANAX) 0.5 MG tablet   Insomnia   Prediabetes     Meds ordered this encounter  Medications  . enalapril (VASOTEC) 20 MG tablet    Sig: Take 1 tablet (20 mg total) by mouth daily.    Dispense:  90 tablet    Refill:  0    Order Specific Question:   Supervising Provider    Answer:   Lilyan Punt A [[id number]]  . PARoxetine (PAXIL) 20 MG tablet    Sig: TAKE 2 TABLETS BY MOUTH IN THE MORNING    Dispense:  180 tablet    Refill:  0    Order Specific Question:   Supervising Provider    Answer:   Lilyan Punt A [[id number]]  . hydrochlorothiazide (HYDRODIURIL) 25 MG tablet    Sig: Take 1/2 (one-half) tablet by mouth once daily    Dispense:  45 tablet    Refill:  0    Order Specific Question:   Supervising Provider    Answer:   Lilyan Punt A [[id number]]  . ALPRAZolam (XANAX) 0.5 MG tablet  Sig: Take 1/2-1 tab po during the day as needed for extreme anxiety and one po qhs prn sleep    Dispense:  45 tablet    Refill:  2    Order Specific Question:   Supervising Provider    Answer:   Lilyan Punt A [[id number]]     Follow Up Instructions: Continue healthy lifestyle habits to help keep her A1c below 6. Reviewed her bone density study specifically FRAX results with her regarding her risk for fracture over the next 10 years.  Discussed potential adverse effects, particularly the common effects the patient's report.  The major concern is her reflux disease and whether this will be exacerbated by use of Fosamax.  Patient elects to try the Fosamax and to let us know if she has any side effects. Monitor blood pressure  and once she is restart her Xanax let us know if it does not go back to baseline. Return in about 3 months (around 02/11/2021) for Routine follow up. Call back sooner if needed peer    I discussed the assessment and treatment plan with the patient. The patient was provided an opportunity to ask questions and all were answered. The patient agreed with the plan and demonstrated an understanding of the instructions.   The patient was advised to call back or seek an in-person evaluation if the symptoms worsen or if the condition fails to improve as anticipated.  I provided 15 minutes of non-face-to-face time during this encounter.       Review of Systems     Objective:   Physical Exam        Assessment & Plan:

## 2020-11-11 NOTE — Telephone Encounter (Signed)
[name], nygard are scheduled for a virtual visit with your provider today.    Just as we do with appointments in the office, we must obtain your consent to participate.  Your consent will be active for this visit and any virtual visit you may have with one of our providers in the next 365 days.    If you have a MyChart account, I can also send a copy of this consent to you electronically.  All virtual visits are billed to your insurance company just like a traditional visit in the office.  As this is a virtual visit, video technology does not allow for your provider to perform a traditional examination.  This may limit your provider's ability to fully assess your condition.  If your provider identifies any concerns that need to be evaluated in person or the need to arrange testing such as labs, EKG, etc, we will make arrangements to do so.    Although advances in technology are sophisticated, we cannot ensure that it will always work on either your end or our end.  If the connection with a video visit is poor, we may have to switch to a telephone visit.  With either a video or telephone visit, we are not always able to ensure that we have a secure connection.   I need to obtain your verbal consent now.   Are you willing to proceed with your visit today?   Melissa Wiley has provided verbal consent on 11/11/2020 for a virtual visit (video or telephone).   Marlowe Shores, LPN 7/98/9211  94:17 AM

## 2020-11-21 ENCOUNTER — Other Ambulatory Visit: Payer: Self-pay

## 2020-11-21 ENCOUNTER — Ambulatory Visit (HOSPITAL_COMMUNITY)
Admission: RE | Admit: 2020-11-21 | Discharge: 2020-11-21 | Disposition: A | Discharge status: Home / Self Care | Payer: Medicare HMO | Source: Ambulatory Visit | Attending: Nurse Practitioner | Admitting: Nurse Practitioner

## 2020-11-21 DIAGNOSIS — Z1231 Encounter for screening mammogram for malignant neoplasm of breast: Secondary | ICD-10-CM | POA: Insufficient documentation

## 2020-12-01 ENCOUNTER — Ambulatory Visit: Payer: Medicare HMO | Admitting: Nurse Practitioner

## 2020-12-01 NOTE — Progress Notes (Deleted)
Referring Provider: Annalee Genta, DO Primary Care Physician:  Annalee Genta, DO Primary GI:  Dr. Marletta Lor  No chief complaint on file.   HPI:   Melissa Wiley is a 70 y.o. female who presents for complaints of feeling "bloated, belching, gassy."  Patient was last seen in our office 04/07/2020 for GERD, normocytic anemia, constipation.  At that time noted colonoscopy up-to-date in 2019 recommend no repeat unless new symptoms develop.  EGD at the same time with mild gastritis found to be positive for H. pylori and treated with triple therapy with confirmed eradication by H. pylori breath testing.  Noted mildly microcytic and hypochromic hemoglobin of 11.0 in 2019.  Recheck of CBC 11/12/2019 with a normalized hemoglobin at 11.1, again very mildly hypochromic but normocytic.  At her last visit denied any obvious bleeding.  BP a little low but feeling asymptomatic.  No overt GI complaints.  She did note she was eating healthier and losing weight.  Most recent hemoglobin A1c was 5.9 but she had just started her weight loss journey is hopefull that it will decline some.  Requesting refill of Protonix.  Did note some mild constipation with a stool every 3 to 4 days and is started taking "smooth move" tea which she stated works well.  Recommended refill Protonix, update labs, continue "smooth move tea" and let us know if there is any worsening, continue weight loss efforts, follow-up in 1 year.  CBC was completed 04/25/2020 which found mildly low hemoglobin but stable at 11.5 noted to be normochromic and mildly hypochromic.  Indices remain stable.  Today she states she doing okay overall.  Past Medical History:  Diagnosis Date  . Anemia   . Anxiety   . Arthritis   . 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 Headache   . Hx of migraines   . Hypertension   . Insomnia   . Stress     Past Surgical History:  Procedure Laterality Date  . BIOPSY  02/03/2018   Procedure: BIOPSY;  Surgeon:  West Bali, MD;  Location: [facility];  Service: Endoscopy;;  duodenum,gastric  . CATARACT EXTRACTION W/PHACO Right 08/05/2020   Procedure: CATARACT EXTRACTION PHACO AND INTRAOCULAR LENS PLACEMENT (IOC);  Surgeon: Fabio Pierce, MD;  Location: [facility];  Service: Ophthalmology;  Laterality: Right;  CDE: 13.10  . CATARACT EXTRACTION W/PHACO Left 08/19/2020   Procedure: CATARACT EXTRACTION PHACO AND INTRAOCULAR LENS PLACEMENT (IOC);  Surgeon: Fabio Pierce, MD;  Location: [facility];  Service: Ophthalmology;  Laterality: Left;  CDE  7.16  . CESAREAN SECTION  1985  . COLONOSCOPY WITH PROPOFOL N/A 02/03/2018   Procedure: COLONOSCOPY WITH PROPOFOL;  Surgeon: West Bali, MD;  Location: [facility];  Service: Endoscopy;  Laterality: N/A;  7:30am  . ESOPHAGOGASTRODUODENOSCOPY (EGD) WITH PROPOFOL N/A 02/03/2018   Procedure: ESOPHAGOGASTRODUODENOSCOPY (EGD) WITH PROPOFOL;  Surgeon: West Bali, MD;  Location: [facility];  Service: Endoscopy;  Laterality: N/A;    Current Outpatient Medications  Medication Sig Dispense Refill  . ALPRAZolam (XANAX) 0.5 MG tablet Take 1/2-1 tab po during the day as needed for extreme anxiety and one po qhs prn sleep 45 tablet 2  . enalapril (VASOTEC) 20 MG tablet Take 1 tablet (20 mg total) by mouth daily. 90 tablet 0  . fexofenadine (ALLEGRA) 180 MG tablet Take 180 mg by mouth daily as needed for allergies or rhinitis.    . hydrochlorothiazide (HYDRODIURIL) 25 MG tablet Take 1/2 (one-half)  tablet by mouth once daily 45 tablet 0  . Multiple Vitamins-Minerals (MULTIVITAMIN WITH MINERALS) tablet Take 1 tablet by mouth daily.    . Omega-3 Fatty Acids (FISH OIL) 1000 MG CAPS Take 1,000 mg by mouth daily.    . pantoprazole (PROTONIX) 40 MG tablet Take 1 tablet (40 mg total) by mouth daily. (Patient taking differently: Take 40 mg by mouth daily before breakfast.) 90 tablet 3  . PARoxetine (PAXIL) 20 MG tablet TAKE 2 TABLETS BY MOUTH IN THE MORNING 180 tablet 0   No  current facility-administered medications for this visit.    Allergies as of 12/01/2020  . (No Known Allergies)    Family History  Problem Relation Age of Onset  . Hypertension Mother   . Hypertension Sister   . Diabetes Sister   . Heart disease Sister   . Colon cancer Neg Hx   . Colon polyps Neg Hx     Social History   Socioeconomic History  . Marital status: Single    Spouse name: Not on file  . Number of children: Not on file  . Years of education: Not on file  . Highest education level: Not on file  Occupational History  . Not on file  Tobacco Use  . Smoking status: Never Smoker  . Smokeless tobacco: Never Used  Vaping Use  . Vaping Use: Never used  Substance and Sexual Activity  . Alcohol use: Never  . Drug use: Never  . Sexual activity: Yes  Other Topics Concern  . Not on file  Social History Narrative   SPECIAL NEEDS CHILD PASSED IN 2019.   Social Determinants of Health   Financial Resource Strain: Not on file  Food Insecurity: Not on file  Transportation Needs: Not on file  Physical Activity: Not on file  Stress: Not on file  Social Connections: Not on file    Subjective:*** Review of Systems  Constitutional: Negative for chills, fever, malaise/fatigue and weight loss.  HENT: Negative for congestion and sore throat.   Respiratory: Negative for cough and shortness of breath.   Cardiovascular: Negative for chest pain and palpitations.  Gastrointestinal: Negative for abdominal pain, blood in stool, diarrhea, melena, nausea and vomiting.  Musculoskeletal: Negative for joint pain and myalgias.  Skin: Negative for rash.  Neurological: Negative for dizziness and weakness.  Endo/Heme/Allergies: Does not bruise/bleed easily.  Psychiatric/Behavioral: Negative for depression. The patient is not nervous/anxious.   All other systems reviewed and are negative.    Objective: There were no vitals taken for this visit. Physical Exam Vitals and nursing note  reviewed.  Constitutional:      General: She is not in acute distress.    Appearance: Normal appearance. She is well-developed. She is not ill-appearing, toxic-appearing or diaphoretic.  HENT:     Head: Normocephalic and atraumatic.     Nose: No congestion or rhinorrhea.  Eyes:     General: No scleral icterus. Cardiovascular:     Rate and Rhythm: Normal rate and regular rhythm.     Heart sounds: Normal heart sounds.  Pulmonary:     Effort: Pulmonary effort is normal. No respiratory distress.     Breath sounds: Normal breath sounds.  Abdominal:     General: Bowel sounds are normal.     Palpations: Abdomen is soft. There is no hepatomegaly, splenomegaly or mass.     Tenderness: There is no abdominal tenderness. There is no guarding or rebound.     Hernia: No hernia is present.  Skin:  General: Skin is warm and dry.     Coloration: Skin is not jaundiced.     Findings: No rash.  Neurological:     General: No focal deficit present.     Mental Status: She is alert and oriented to person, place, and time.  Psychiatric:        Attention and Perception: Attention normal.        Mood and Affect: Mood normal.        Speech: Speech normal.        Behavior: Behavior normal.        Thought Content: Thought content normal.        Cognition and Memory: Cognition and memory normal.      Assessment:  ***   Plan: ***    Thank you for allowing [place] to participate in the care of Remigio Eisenmenger, DNP, AGNP-C Adult & Gerontological Nurse Practitioner [facility]   12/01/2020 7:42 AM   Disclaimer: This note was dictated with voice recognition software. Similar sounding words can inadvertently be transcribed and may not be corrected upon review.

## 2021-01-01 IMAGING — MG DIGITAL SCREENING BILAT W/ TOMO W/ CAD
8 series · 8 of 24 positions shown · non-contrast
Comparison: Previous exam(s).

CLINICAL DATA: Screening.

EXAM:
DIGITAL SCREENING BILATERAL MAMMOGRAM WITH TOMO AND CAD

[L MLO synth-2D]
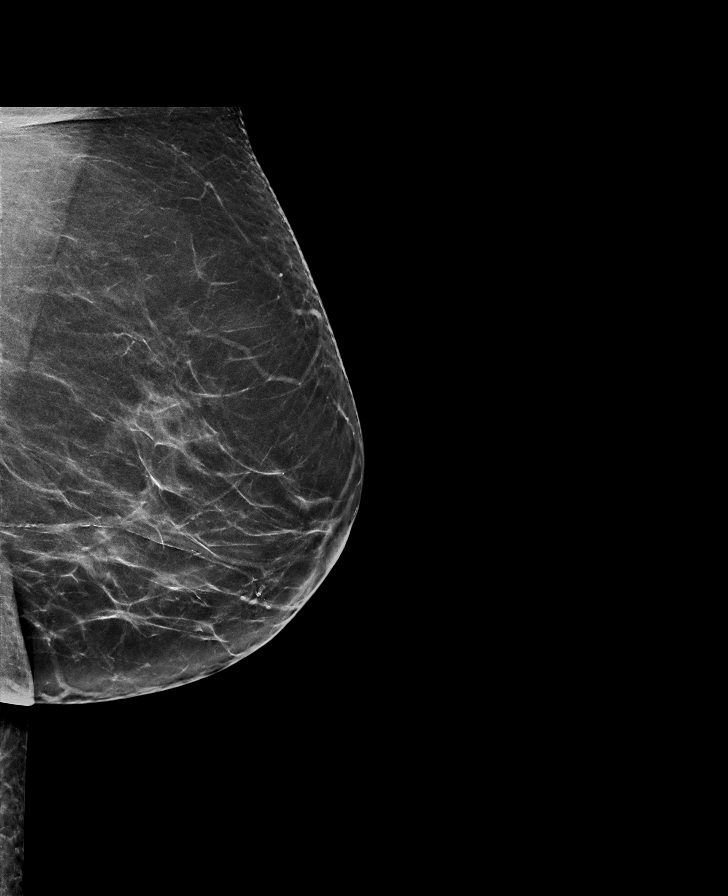

[R MLO synth-2D]
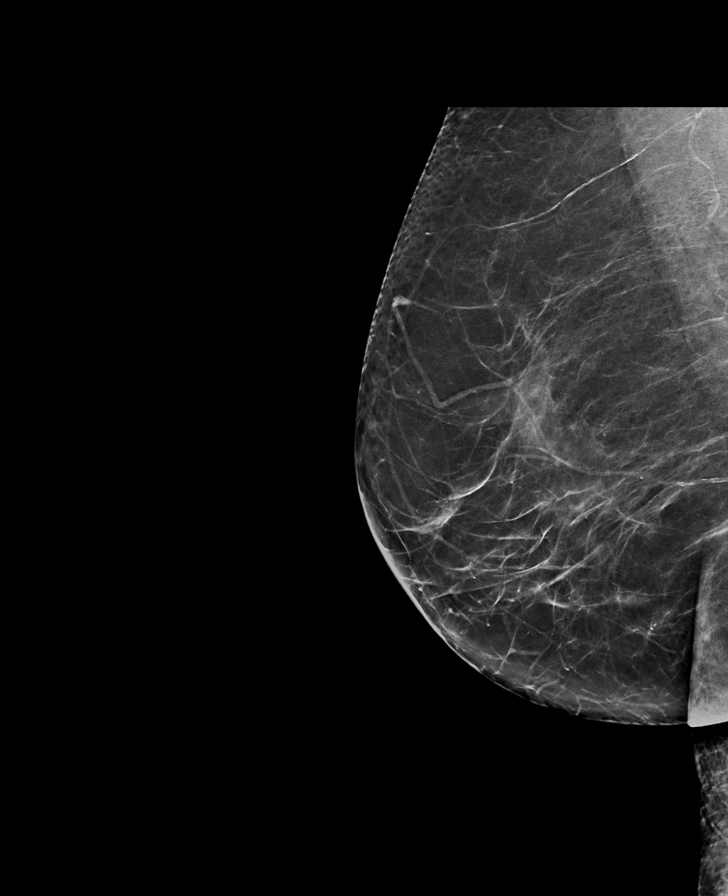

[L CC synth-2D]
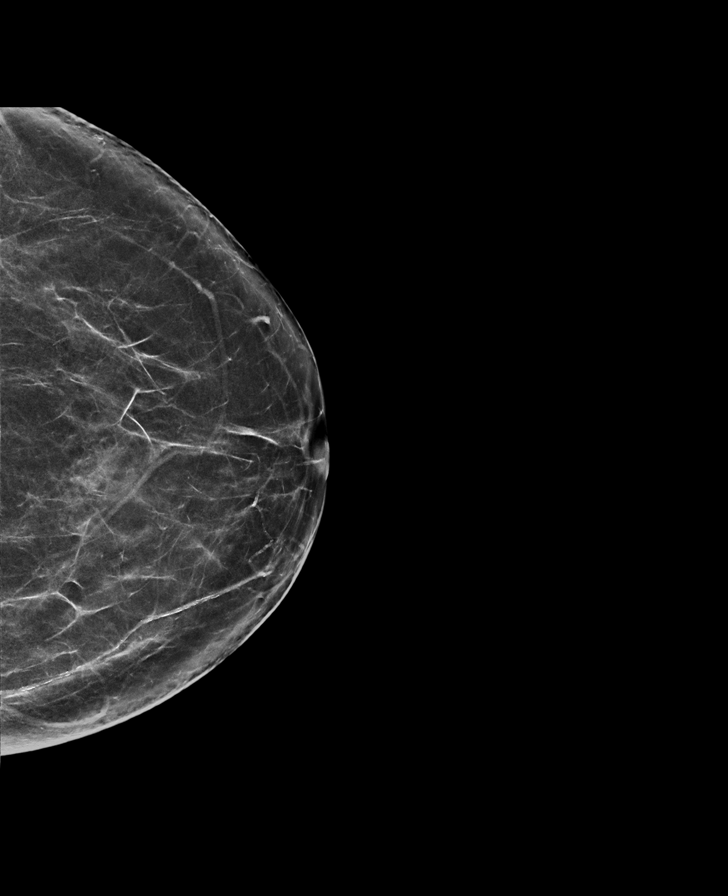

[R CC synth-2D]
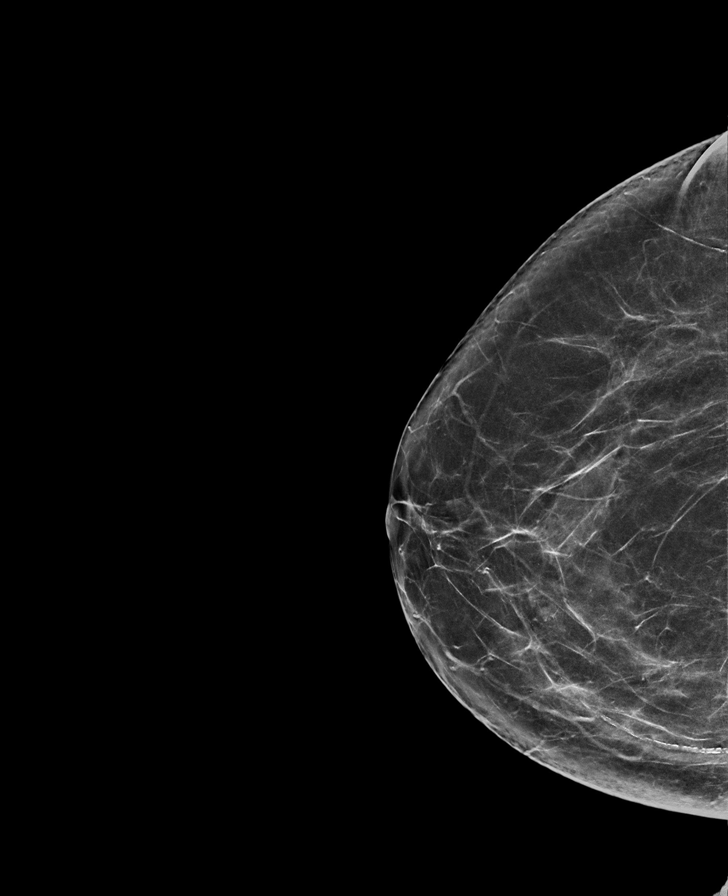

[R MLO tomo · tomo slice 35/69.0]
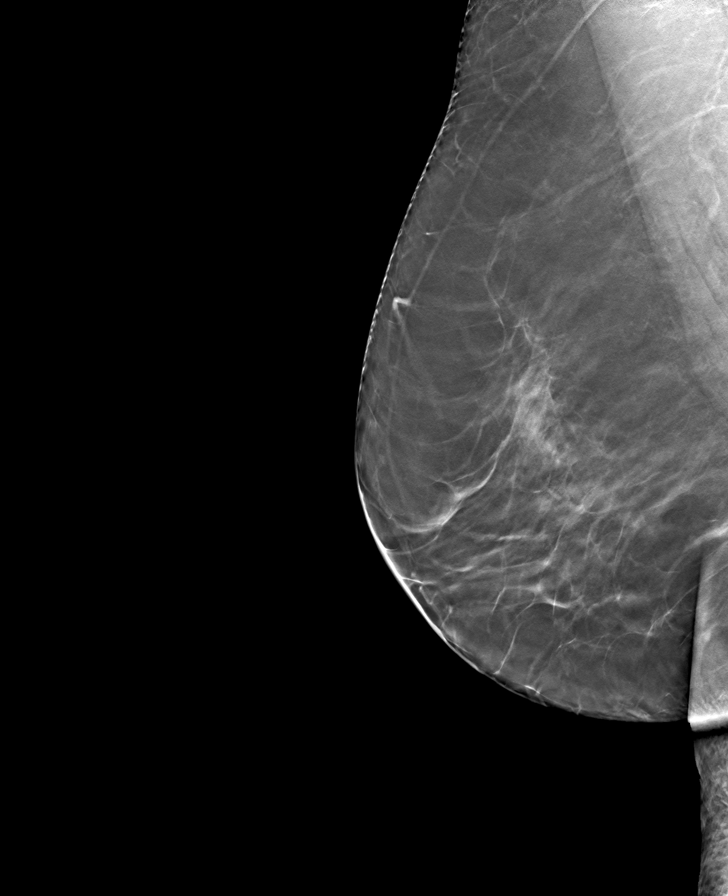

[L MLO tomo · tomo slice 37/72.0]
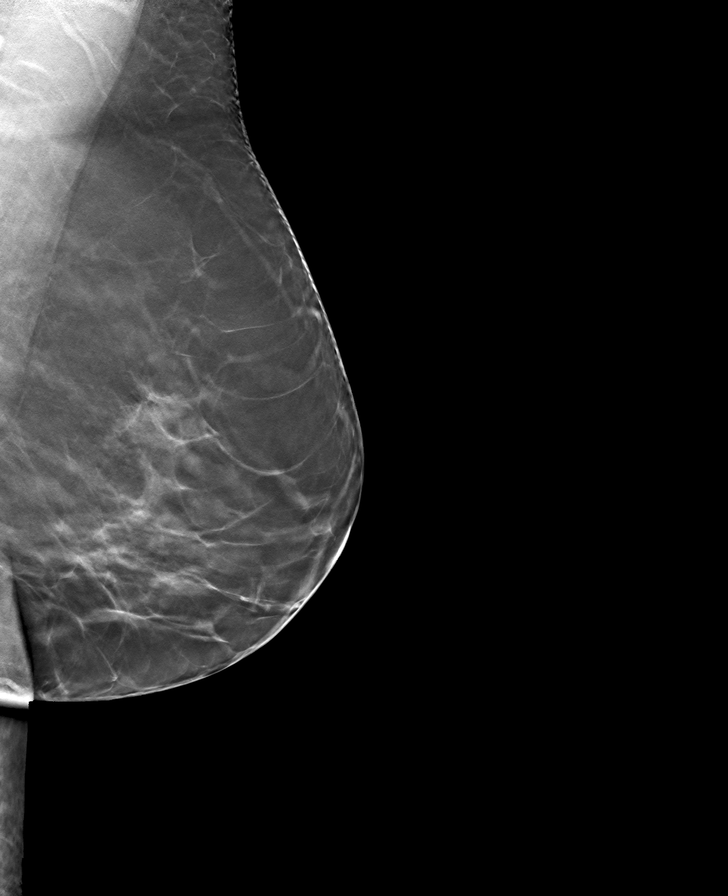

[R CC tomo · tomo slice 35/69.0]
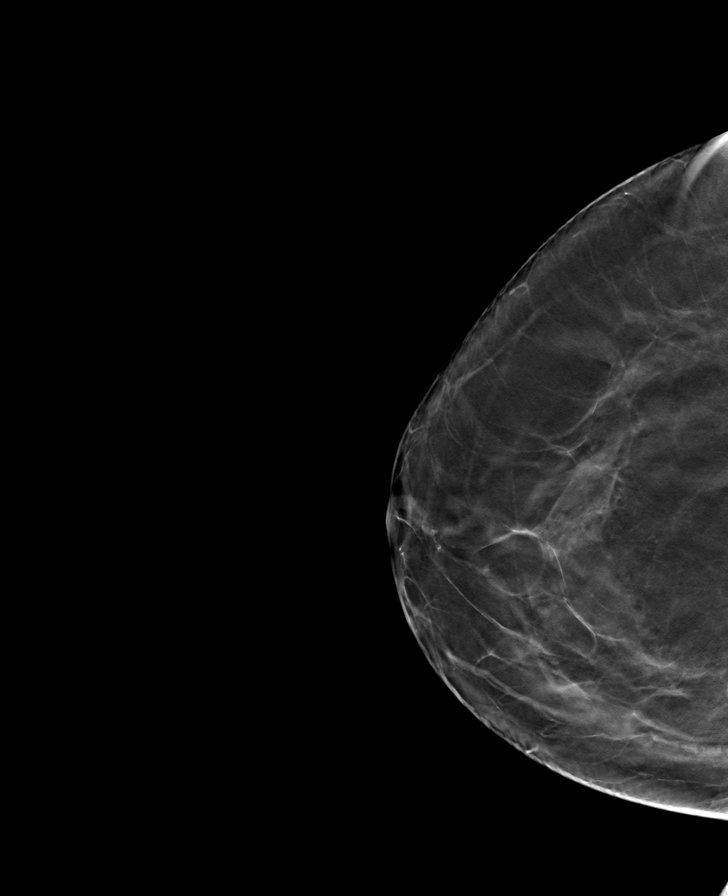

[L CC tomo · tomo slice 34/67.0]
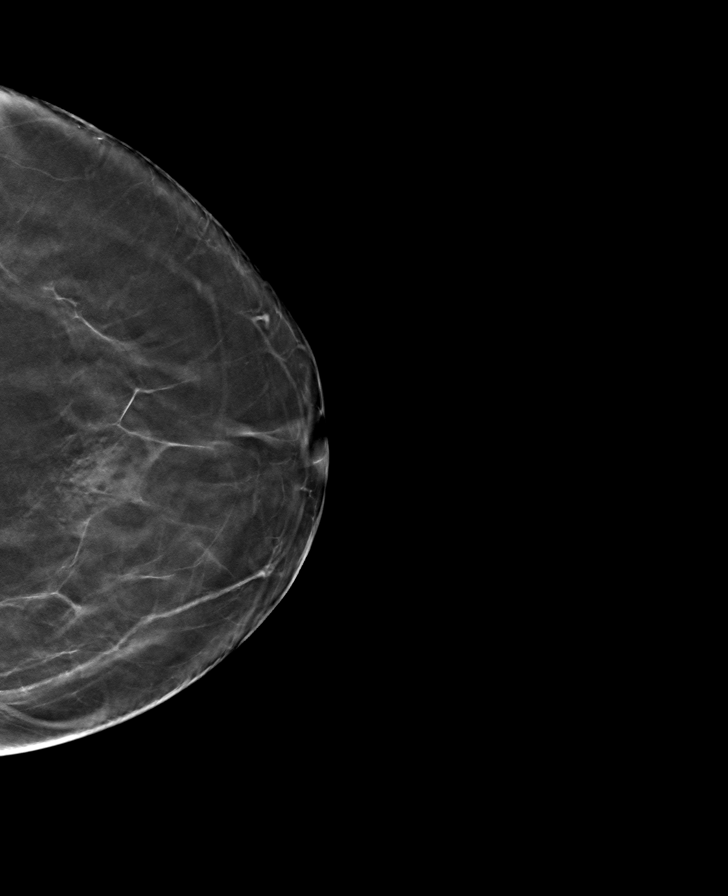

[8 of 24 positions shown; findings below may reference images not displayed]

ACR Breast Density Category b: There are scattered areas of
fibroglandular density.
FINDINGS: There are no findings suspicious for malignancy. Images were
processed with CAD.
IMPRESSION: No mammographic evidence of malignancy. A result letter of this
screening mammogram will be mailed directly to the patient.

RECOMMENDATION:
Screening mammogram in one year. (Code:[id number])

BI-RADS CATEGORY  1: Negative.

## 2021-02-01 ENCOUNTER — Other Ambulatory Visit: Payer: Self-pay

## 2021-02-01 ENCOUNTER — Ambulatory Visit (INDEPENDENT_AMBULATORY_CARE_PROVIDER_SITE_OTHER): Payer: Medicare HMO | Admitting: Internal Medicine

## 2021-02-01 ENCOUNTER — Encounter: Payer: Self-pay | Admitting: Internal Medicine

## 2021-02-01 VITALS — BP 127/78 | HR 91 | Temp 97.3°F | Ht 63.0 in | Wt 182.4 lb

## 2021-02-01 DIAGNOSIS — K219 Gastro-esophageal reflux disease without esophagitis: Secondary | ICD-10-CM

## 2021-02-01 DIAGNOSIS — K5900 Constipation, unspecified: Secondary | ICD-10-CM

## 2021-02-01 NOTE — Patient Instructions (Signed)
I am happy to hear you are doing better.  Continue on stool softener daily.  You can trial decreasing dose to 1 to 2 pills a day but it is certainly safe to take 3 a day if need be.  If you feel like you are getting backed up, you can add 1-2 capfuls of MiraLAX daily on top of this.  Give [place] a call if your symptoms worsen, otherwise follow-up in 6 months.  At [facility] we value your feedback. You may receive a survey about your visit today. Please share your experience as we strive to create trusting relationships with our patients to provide genuine, compassionate, quality care.  We appreciate your understanding and patience as we review any laboratory studies, imaging, and other diagnostic tests that are ordered as we care for you. Our office policy is 5 business days for review of these results, and any emergent or urgent results are addressed in a timely manner for your best interest. If you do not hear from our office in 1 week, please contact us.   We also encourage the use of MyChart, which contains your medical information for your review as well. If you are not enrolled in this feature, an access code is on this after visit summary for your convenience. Thank you for allowing [place] to be involved in your care.  It was great to see you today!  I hope you have a great rest of your spring!!    Hennie Duos. Marletta Lor, D.O. Gastroenterology and Hepatology [facility]

## 2021-02-01 NOTE — Progress Notes (Signed)
Referring Provider: Annalee Genta, DO Primary Care Physician:  Annalee Genta, DO Primary GI:  Dr. Marletta Lor  Chief Complaint  Patient presents with  . Gastroesophageal Reflux    Some belching    HPI:   Melissa Wiley is a 70 y.o. female who presents to the clinic today for follow-up visit.  We follow her for both constipation and chronic reflux.  For chronic reflux she is currently takes pantoprazole 40 mg daily and states her symptoms are well controlled.  Denies any dysphagia odynophagia.  No epigastric pain.  No melena hematochezia.  Also has chronic constipation which she took it upon herself to start a stool softener a few months ago.  She takes 3 pills a day of this.  She is unsure what stool softener she takes.  She states since adding this to her regimen that her bowels have become more regular.    EGD 02/03/2018 with H. pylori gastritis.  Treated with triple therapy and subsequent eradication testing being negative.  Colonoscopy 02/03/2018 unremarkable with no recall given due to age.  Past Medical History:  Diagnosis Date  . Anemia   . Anxiety   . Arthritis   . 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 Headache   . Hx of migraines   . Hypertension   . Insomnia   . Stress     Past Surgical History:  Procedure Laterality Date  . BIOPSY  02/03/2018   Procedure: BIOPSY;  Surgeon: West Bali, MD;  Location: [facility];  Service: Endoscopy;;  duodenum,gastric  . CATARACT EXTRACTION W/PHACO Right 08/05/2020   Procedure: CATARACT EXTRACTION PHACO AND INTRAOCULAR LENS PLACEMENT (IOC);  Surgeon: Fabio Pierce, MD;  Location: [facility];  Service: Ophthalmology;  Laterality: Right;  CDE: 13.10  . CATARACT EXTRACTION W/PHACO Left 08/19/2020   Procedure: CATARACT EXTRACTION PHACO AND INTRAOCULAR LENS PLACEMENT (IOC);  Surgeon: Fabio Pierce, MD;  Location: [facility];  Service: Ophthalmology;  Laterality: Left;  CDE  7.16  . CESAREAN SECTION  1985  . COLONOSCOPY WITH  PROPOFOL N/A 02/03/2018   Procedure: COLONOSCOPY WITH PROPOFOL;  Surgeon: West Bali, MD;  Location: [facility];  Service: Endoscopy;  Laterality: N/A;  7:30am  . ESOPHAGOGASTRODUODENOSCOPY (EGD) WITH PROPOFOL N/A 02/03/2018   Procedure: ESOPHAGOGASTRODUODENOSCOPY (EGD) WITH PROPOFOL;  Surgeon: West Bali, MD;  Location: [facility];  Service: Endoscopy;  Laterality: N/A;    Current Outpatient Medications  Medication Sig Dispense Refill  . ALPRAZolam (XANAX) 0.5 MG tablet Take 1/2-1 tab po during the day as needed for extreme anxiety and one po qhs prn sleep 45 tablet 2  . enalapril (VASOTEC) 20 MG tablet Take 1 tablet (20 mg total) by mouth daily. 90 tablet 0  . fexofenadine (ALLEGRA) 180 MG tablet Take 180 mg by mouth daily as needed for allergies or rhinitis.    . hydrochlorothiazide (HYDRODIURIL) 25 MG tablet Take 1/2 (one-half) tablet by mouth once daily 45 tablet 0  . Multiple Vitamins-Minerals (MULTIVITAMIN WITH MINERALS) tablet Take 1 tablet by mouth daily.    . Omega-3 Fatty Acids (FISH OIL) 1000 MG CAPS Take 1,000 mg by mouth daily.    . pantoprazole (PROTONIX) 40 MG tablet Take 1 tablet (40 mg total) by mouth daily. (Patient taking differently: Take 40 mg by mouth daily before breakfast.) 90 tablet 3  . PARoxetine (PAXIL) 20 MG tablet TAKE 2 TABLETS BY MOUTH IN THE MORNING 180 tablet 0   No current facility-administered medications  for this visit.    Allergies as of 02/01/2021  . (No Known Allergies)    Family History  Problem Relation Age of Onset  . Hypertension Mother   . Hypertension Sister   . Diabetes Sister   . Heart disease Sister   . Colon cancer Neg Hx   . Colon polyps Neg Hx     Social History   Socioeconomic History  . Marital status: Single    Spouse name: Not on file  . Number of children: Not on file  . Years of education: Not on file  . Highest education level: Not on file  Occupational History  . Not on file  Tobacco Use  . Smoking  status: Never Smoker  . Smokeless tobacco: Never Used  Vaping Use  . Vaping Use: Never used  Substance and Sexual Activity  . Alcohol use: Never  . Drug use: Never  . Sexual activity: Yes  Other Topics Concern  . Not on file  Social History Narrative   SPECIAL NEEDS CHILD PASSED IN 2019.   Social Determinants of Health   Financial Resource Strain: Not on file  Food Insecurity: Not on file  Transportation Needs: Not on file  Physical Activity: Not on file  Stress: Not on file  Social Connections: Not on file    Subjective: Review of Systems  Constitutional: Negative for chills and fever.  HENT: Negative for congestion and hearing loss.   Eyes: Negative for blurred vision and double vision.  Respiratory: Negative for cough and shortness of breath.   Cardiovascular: Negative for chest pain and palpitations.  Gastrointestinal: Positive for constipation and heartburn. Negative for abdominal pain, blood in stool, diarrhea, melena and vomiting.  Genitourinary: Negative for dysuria and urgency.  Musculoskeletal: Negative for joint pain and myalgias.  Skin: Negative for itching and rash.  Neurological: Negative for dizziness and headaches.  Psychiatric/Behavioral: Negative for depression. The patient is not nervous/anxious.      Objective: BP 127/78   Pulse 91   Temp (!) 97.3 F (36.3 C)   Ht 5\' 3"  (1.6 m)   Wt 182 lb 6.4 oz (82.7 kg)   BMI 32.31 kg/m  Physical Exam Constitutional:      Appearance: Normal appearance.  HENT:     Head: Normocephalic and atraumatic.  Eyes:     Extraocular Movements: Extraocular movements intact.     Conjunctiva/sclera: Conjunctivae normal.  Cardiovascular:     Rate and Rhythm: Normal rate and regular rhythm.  Pulmonary:     Effort: Pulmonary effort is normal.     Breath sounds: Normal breath sounds.  Abdominal:     General: Bowel sounds are normal.     Palpations: Abdomen is soft.  Musculoskeletal:        General: No swelling.  Normal range of motion.     Cervical back: Normal range of motion and neck supple.  Skin:    General: Skin is warm and dry.     Coloration: Skin is not jaundiced.  Neurological:     General: No focal deficit present.     Mental Status: She is alert and oriented to person, place, and time.  Psychiatric:        Mood and Affect: Mood normal.        Behavior: Behavior normal.      Assessment: *Chronic GERD- well controlled on Protonix *Constipation - well controlled on OTC stool softener   Plan: Patient's chronic GERD well-controlled on Protonix daily.  We will continue.  No alarm symptoms today.  Constipation doing better on daily stool softener.  Recommend she continue this.  If she feels as though she is getting backed up, she can add on 1-2 capfuls of MiraLAX as well.  Follow-up in 6 months or sooner if needed  02/01/2021 3:04 PM   Disclaimer: This note was dictated with voice recognition software. Similar sounding words can inadvertently be transcribed and may not be corrected upon review.

## 2021-02-08 ENCOUNTER — Other Ambulatory Visit: Payer: Self-pay

## 2021-02-08 DIAGNOSIS — I10 Essential (primary) hypertension: Secondary | ICD-10-CM

## 2021-02-08 MED ORDER — ENALAPRIL MALEATE 20 MG PO TABS: 20.0000 mg | ORAL_TABLET | Freq: Every day | ORAL | 0 refills | Status: DC

## 2021-02-10 ENCOUNTER — Other Ambulatory Visit: Payer: Self-pay

## 2021-02-10 ENCOUNTER — Encounter: Payer: Self-pay | Admitting: Nurse Practitioner

## 2021-02-10 ENCOUNTER — Ambulatory Visit (INDEPENDENT_AMBULATORY_CARE_PROVIDER_SITE_OTHER): Payer: Medicare HMO | Admitting: Nurse Practitioner

## 2021-02-10 VITALS — BP 124/82 | HR 88 | Temp 98.1°F | Ht 63.0 in | Wt 178.0 lb

## 2021-02-10 DIAGNOSIS — F32A Depression, unspecified: Secondary | ICD-10-CM

## 2021-02-10 DIAGNOSIS — I10 Essential (primary) hypertension: Secondary | ICD-10-CM | POA: Diagnosis not present

## 2021-02-10 DIAGNOSIS — D649 Anemia, unspecified: Secondary | ICD-10-CM | POA: Diagnosis not present

## 2021-02-10 DIAGNOSIS — F419 Anxiety disorder, unspecified: Secondary | ICD-10-CM | POA: Diagnosis not present

## 2021-02-10 DIAGNOSIS — G4709 Other insomnia: Secondary | ICD-10-CM

## 2021-02-10 DIAGNOSIS — R69 Illness, unspecified: Secondary | ICD-10-CM | POA: Diagnosis not present

## 2021-02-10 MED ORDER — PAROXETINE HCL 20 MG PO TABS: ORAL_TABLET | ORAL | 1 refills | Status: DC

## 2021-02-10 MED ORDER — ALPRAZOLAM 0.5 MG PO TABS: ORAL_TABLET | ORAL | 2 refills | Status: DC

## 2021-02-10 MED ORDER — ENALAPRIL MALEATE 20 MG PO TABS
20.0000 mg | ORAL_TABLET | Freq: Every day | ORAL | 1 refills | Status: DC
Start: 2021-02-10 — End: 2021-11-20

## 2021-02-10 MED ORDER — HYDROCHLOROTHIAZIDE 25 MG PO TABS: ORAL_TABLET | ORAL | 1 refills | Status: DC

## 2021-02-10 NOTE — Progress Notes (Signed)
Subjective:    Patient ID: Melissa Wiley, female    DOB: [birth year], 70 y.o.   MRN: [id number]  Hypertension  Medication Refill  Presents for routine follow-up for chronic health issues. Adherent to medication regimen.  Gets regular follow-up with gastroenterology. Doing well on paroxetine 40 mg.  Taking Xanax daily for anxiety/sleep. No chest pain/ischemic type pain shortness of breath or edema.  Takes daily multivitamin.  Depression screen [facility] 2/9 02/10/2021 07/08/2020 03/22/2020 09/29/2019 10/28/2018  Decreased Interest 0 0 1 3 1   Down, Depressed, Hopeless 0 0 0 0 0  PHQ - 2 Score 0 0 1 3 1   Altered sleeping 0 0 0 0 1  Tired, decreased energy 1 0 1 3 1   Change in appetite 0 0 2 3 0  Feeling bad or failure about yourself  0 0 0 0 0  Trouble concentrating 0 0 0 0 0  Moving slowly or fidgety/restless 0 0 0 0 0  Suicidal thoughts 0 0 0 0 0  PHQ-9 Score 1 0 4 9 3   Difficult doing work/chores Not difficult at all Not difficult at all Somewhat difficult Not difficult at all Not difficult at all   GAD 7 : Generalized Anxiety Score 02/10/2021 03/22/2020 09/29/2019  Nervous, Anxious, on Edge 0 1 0  Control/stop worrying 0 1 0  Worry too much - different things 0 1 0  Trouble relaxing 0 0 0  Restless 0 0 0  Easily annoyed or irritable 0 0 0  Afraid - awful might happen 0 0 0  Total GAD 7 Score 0 3 0  Anxiety Difficulty Not difficult at all Somewhat difficult Not difficult at all         Objective:   Physical Exam NAD.  Alert, oriented.  Lungs clear.  Heart regular rate rhythm.  No murmur noted.  Carotids no bruits or thrills.  Calm cheerful affect.  Making good eye contact.  Dressed appropriately.  Thoughts logical coherent and relevant. Today's Vitals   02/10/21 1317  BP: 124/82  Pulse: 88  Temp: 98.1 F (36.7 C)  SpO2: 95%  Weight: 178 lb (80.7 kg)  Height: 5\' 3"  (1.6 m)   Body mass index is 31.53 kg/m.        Assessment & Plan:   Problem List Items Addressed  This Visit       Cardiovascular and Mediastinum   Essential hypertension, benign   Relevant Medications   enalapril (VASOTEC) 20 MG tablet   hydrochlorothiazide (HYDRODIURIL) 25 MG tablet   Other Relevant Orders   CBC with Differential/Platelet   Comprehensive metabolic panel   Lipid panel     Other   Anxiety and depression   Relevant Medications   PARoxetine (PAXIL) 20 MG tablet   ALPRAZolam (XANAX) 0.5 MG tablet   Other Relevant Orders   CBC with Differential/Platelet   Comprehensive metabolic panel   Lipid panel   Insomnia   Relevant Orders   CBC with Differential/Platelet   Comprehensive metabolic panel   Lipid panel   Normocytic anemia - Primary   Relevant Orders   CBC with Differential/Platelet   Comprehensive metabolic panel   Lipid panel   Meds ordered this encounter  Medications   PARoxetine (PAXIL) 20 MG tablet    Sig: TAKE 2 TABLETS BY MOUTH IN THE MORNING    Dispense:  180 tablet    Refill:  1   enalapril (VASOTEC) 20 MG tablet    Sig: Take 1 tablet (20 mg  total) by mouth daily.    Dispense:  90 tablet    Refill:  1   hydrochlorothiazide (HYDRODIURIL) 25 MG tablet    Sig: Take 1/2 (one-half) tablet by mouth once daily    Dispense:  45 tablet    Refill:  1   ALPRAZolam (XANAX) 0.5 MG tablet    Sig: Take 1/2-1 tab po during the day as needed for extreme anxiety and one po qhs prn sleep    Dispense:  45 tablet    Refill:  2   Continue current medications as directed. Encouraged healthy diet regular activity and weight loss. Labs ordered. Return in about 3 months (around 05/13/2021).

## 2021-02-11 ENCOUNTER — Other Ambulatory Visit: Payer: Self-pay | Admitting: Nurse Practitioner

## 2021-02-16 DIAGNOSIS — G4709 Other insomnia: Secondary | ICD-10-CM | POA: Diagnosis not present

## 2021-02-16 DIAGNOSIS — I10 Essential (primary) hypertension: Secondary | ICD-10-CM | POA: Diagnosis not present

## 2021-02-16 DIAGNOSIS — D649 Anemia, unspecified: Secondary | ICD-10-CM | POA: Diagnosis not present

## 2021-02-16 DIAGNOSIS — R69 Illness, unspecified: Secondary | ICD-10-CM | POA: Diagnosis not present

## 2021-02-17 LAB — LIPID PANEL
Chol/HDL Ratio: 4.4 ratio (ref 0.0–4.4)
Cholesterol, Total: 195 mg/dL (ref 100–199)
HDL: 44 mg/dL (ref >39)
LDL Chol Calc (NIH): 115 mg/dL — ABNORMAL HIGH (ref 0–99)
Triglycerides: 207 mg/dL — ABNORMAL HIGH (ref 0–149)
VLDL Cholesterol Cal: 36 mg/dL (ref 5–40)

## 2021-02-17 LAB — CBC WITH DIFFERENTIAL/PLATELET
Basophils Absolute: 0.1 10*3/uL (ref 0.0–0.2)
Basos: 1 %
EOS (ABSOLUTE): 0.2 10*3/uL (ref 0.0–0.4)
Eos: 2 %
Hematocrit: 37.3 % (ref 34.0–46.6)
Hemoglobin: 11.5 g/dL (ref 11.1–15.9)
Immature Grans (Abs): 0 10*3/uL (ref 0.0–0.1)
Immature Granulocytes: 0 %
Lymphocytes Absolute: 2 10*3/uL (ref 0.7–3.1)
Lymphs: 28 %
MCH: 24.2 pg — ABNORMAL LOW (ref 26.6–33.0)
MCHC: 30.8 g/dL — ABNORMAL LOW (ref 31.5–35.7)
MCV: 79 fL (ref 79–97)
Monocytes Absolute: 0.3 10*3/uL (ref 0.1–0.9)
Monocytes: 5 %
Neutrophils Absolute: 4.5 10*3/uL (ref 1.4–7.0)
Neutrophils: 64 %
Platelets: 311 10*3/uL (ref 150–450)
RBC: 4.75 x10E6/uL (ref 3.77–5.28)
RDW: 12.5 % (ref 11.7–15.4)
WBC: 7 10*3/uL (ref 3.4–10.8)

## 2021-02-17 LAB — COMPREHENSIVE METABOLIC PANEL
ALT: 10 IU/L (ref 0–32)
AST: 10 IU/L (ref 0–40)
Albumin/Globulin Ratio: 1.6 (ref 1.2–2.2)
Albumin: 4.5 g/dL (ref 3.7–4.7)
Alkaline Phosphatase: 68 IU/L (ref 44–121)
BUN/Creatinine Ratio: 16 (ref 12–28)
BUN: 12 mg/dL (ref 8–27)
Bilirubin Total: 0.2 mg/dL (ref 0.0–1.2)
CO2: 27 mmol/L (ref 20–29)
Calcium: 9.8 mg/dL (ref 8.7–10.3)
Chloride: 101 mmol/L (ref 96–106)
Creatinine, Ser: 0.73 mg/dL (ref 0.57–1.00)
Globulin, Total: 2.9 g/dL (ref 1.5–4.5)
Glucose: 118 mg/dL — ABNORMAL HIGH (ref 65–99)
Potassium: 3.8 mmol/L (ref 3.5–5.2)
Sodium: 142 mmol/L (ref 134–144)
Total Protein: 7.4 g/dL (ref 6.0–8.5)
eGFR: 87 mL/min/{1.73_m2} (ref >59)

## 2021-03-30 DIAGNOSIS — H43813 Vitreous degeneration, bilateral: Secondary | ICD-10-CM | POA: Diagnosis not present

## 2021-05-12 ENCOUNTER — Ambulatory Visit (INDEPENDENT_AMBULATORY_CARE_PROVIDER_SITE_OTHER): Payer: Medicare HMO | Admitting: Nurse Practitioner

## 2021-05-12 ENCOUNTER — Other Ambulatory Visit: Payer: Self-pay

## 2021-05-12 VITALS — BP 107/73 | Ht 63.0 in | Wt 178.8 lb

## 2021-05-12 DIAGNOSIS — K219 Gastro-esophageal reflux disease without esophagitis: Secondary | ICD-10-CM | POA: Diagnosis not present

## 2021-05-12 DIAGNOSIS — I10 Essential (primary) hypertension: Secondary | ICD-10-CM

## 2021-05-12 MED ORDER — PANTOPRAZOLE SODIUM 40 MG PO TBEC: 40.0000 mg | DELAYED_RELEASE_TABLET | Freq: Every day | ORAL | 1 refills | Status: DC

## 2021-05-12 NOTE — Progress Notes (Addendum)
   Subjective:    Patient ID: Melissa Wiley, female    DOB: Jan 02, 1951, 70 y.o.   MRN: [id number]  Hypertension Pertinent negatives include no chest pain or shortness of breath.  Adherent to medication regimen. BP outside of office: 120-130/70-80. Stays active. Diet: eats a lot of sweets.  Gets some fatigue if she works on her yard during the summer. Will go back in the house for awhile and feels better. Does not experience this at other times.  GERD stable on daily Pantoprazole.   Review of Systems  Constitutional:  Positive for fatigue. Negative for activity change and appetite change.  Respiratory:  Negative for cough, chest tightness, shortness of breath and wheezing.   Cardiovascular:  Negative for chest pain and leg swelling.  Gastrointestinal:  Negative for abdominal pain, constipation, diarrhea, nausea and vomiting.  Neurological:  Negative for dizziness, syncope, weakness and light-headedness.       Objective:   Physical Exam Constitutional:      General: She is not in acute distress.    Appearance: Normal appearance.  Neck:     Comments: Thyroid non-tender with no masses or nodules noted on palpation Cardiovascular:     Rate and Rhythm: Normal rate and regular rhythm.     Heart sounds: No murmur heard.    Comments: Carotids no bruits or thrills.  Pulmonary:     Effort: Pulmonary effort is normal.     Breath sounds: Normal breath sounds.  Abdominal:     General: There is no distension.     Tenderness: There is no abdominal tenderness. There is no guarding.  Musculoskeletal:     Cervical back: Neck supple.     Right lower leg: No edema.     Left lower leg: No edema.  Lymphadenopathy:     Cervical: No cervical adenopathy.  Skin:    General: Skin is warm and dry.  Neurological:     Mental Status: She is alert and oriented to person, place, and time.  Psychiatric:        Mood and Affect: Mood normal.        Behavior: Behavior normal.        Thought Content:  Thought content normal.        Judgment: Judgment normal.    .. Today's Vitals   05/12/21 1319  BP: 107/73  Weight: 178 lb 12.8 oz (81.1 kg)  Height: 5\' 3"  (1.6 m)   Body mass index is 31.67 kg/m. 4 lb weight loss since June.       Assessment & Plan:   Problem List Items Addressed This Visit       Cardiovascular and Mediastinum   Essential hypertension, benign - Primary     Digestive   GERD (gastroesophageal reflux disease)   Relevant Medications   pantoprazole (PROTONIX) 40 MG tablet   Meds ordered this encounter  Medications  . pantoprazole (PROTONIX) 40 MG tablet    Sig: Take 1 tablet (40 mg total) by mouth daily.    Dispense:  90 tablet    Refill:  1    Order Specific Question:   Supervising Provider    Answer:   July A Lilyan Punt   Continue current regimen as directed.  Recommend 5-10 lbs of weight loss, reduce sugar and simple carbs in diet and regular activity such as walking.  Return in about 6 months (around 11/09/2021) for BP check up. Wellness exam due in November.

## 2021-05-12 NOTE — Progress Notes (Signed)
   Subjective:    Patient ID: Melissa Wiley, female    DOB: 02/27/1951, 70 y.o.   MRN: [id number]  Hypertension This is a chronic problem. Risk factors for coronary artery disease include post-menopausal state. Treatments tried: vasotec. There are no compliance problems.      Review of Systems     Objective:   Physical Exam        Assessment & Plan:

## 2021-05-13 ENCOUNTER — Encounter: Payer: Self-pay | Admitting: Nurse Practitioner

## 2021-06-21 ENCOUNTER — Encounter: Payer: Self-pay | Admitting: Emergency Medicine

## 2021-06-21 ENCOUNTER — Other Ambulatory Visit: Payer: Self-pay

## 2021-06-21 ENCOUNTER — Ambulatory Visit
Admission: EM | Admit: 2021-06-21 | Discharge: 2021-06-21 | Disposition: A | Discharge status: Home / Self Care | Payer: Medicare HMO | Attending: Urgent Care | Admitting: Urgent Care

## 2021-06-21 DIAGNOSIS — R070 Pain in throat: Secondary | ICD-10-CM | POA: Insufficient documentation

## 2021-06-21 DIAGNOSIS — J069 Acute upper respiratory infection, unspecified: Secondary | ICD-10-CM | POA: Diagnosis not present

## 2021-06-21 DIAGNOSIS — I10 Essential (primary) hypertension: Secondary | ICD-10-CM | POA: Diagnosis not present

## 2021-06-21 DIAGNOSIS — R030 Elevated blood-pressure reading, without diagnosis of hypertension: Secondary | ICD-10-CM | POA: Diagnosis not present

## 2021-06-21 DIAGNOSIS — R0981 Nasal congestion: Secondary | ICD-10-CM | POA: Diagnosis not present

## 2021-06-21 DIAGNOSIS — R052 Subacute cough: Secondary | ICD-10-CM | POA: Diagnosis not present

## 2021-06-21 LAB — POCT RAPID STREP A (OFFICE): Rapid Strep A Screen: NEGATIVE

## 2021-06-21 MED ORDER — PROMETHAZINE-DM 6.25-15 MG/5ML PO SYRP: 5.0000 mL | ORAL_SOLUTION | Freq: Every evening | ORAL | 0 refills | Status: DC | PRN

## 2021-06-21 MED ORDER — CHLORASEPTIC 1.4 % MT LIQD: 1.0000 | Freq: Three times a day (TID) | OROMUCOSAL | 0 refills | Status: DC | PRN

## 2021-06-21 MED ORDER — CETIRIZINE HCL 5 MG PO TABS: 5.0000 mg | ORAL_TABLET | Freq: Every day | ORAL | 0 refills | Status: DC

## 2021-06-21 NOTE — ED Provider Notes (Signed)
[facility]   MRN: [id number] DOB: [birth year]  Subjective:   Melissa Wiley is a 70 y.o. female presenting for 2-day history of dry cough, sinus congestion and throat pain.  No fever, body aches, chest pain, shortness of breath.  Patient does not want a COVID test.  Has been using Mucinex without any relief.  No history of respiratory disorders.  No current facility-administered medications for this encounter.  Current Outpatient Medications:    ALPRAZolam (XANAX) 0.5 MG tablet, Take 1/2-1 tab po during the day as needed for extreme anxiety and one po qhs prn sleep, Disp: 45 tablet, Rfl: 2   enalapril (VASOTEC) 20 MG tablet, Take 1 tablet (20 mg total) by mouth daily., Disp: 90 tablet, Rfl: 1   hydrochlorothiazide (HYDRODIURIL) 25 MG tablet, Take 1/2 (one-half) tablet by mouth once daily, Disp: 45 tablet, Rfl: 1   Multiple Vitamins-Minerals (MULTIVITAMIN WITH MINERALS) tablet, Take 1 tablet by mouth daily., Disp: , Rfl:    Omega-3 Fatty Acids (FISH OIL) 1000 MG CAPS, Take 1,000 mg by mouth daily., Disp: , Rfl:    pantoprazole (PROTONIX) 40 MG tablet, Take 1 tablet (40 mg total) by mouth daily., Disp: 90 tablet, Rfl: 1   PARoxetine (PAXIL) 20 MG tablet, TAKE 2 TABLETS BY MOUTH IN THE MORNING, Disp: 180 tablet, Rfl: 1   fexofenadine (ALLEGRA) 180 MG tablet, Take 180 mg by mouth daily as needed for allergies or rhinitis., Disp: , Rfl:    No Known Allergies  Past Medical History:  Diagnosis Date   Anemia    Anxiety    Arthritis    Grand mal seizure disorder (HCC) 1950's   Headache    Hx of migraines    Hypertension    Insomnia    Stress      Past Surgical History:  Procedure Laterality Date   BIOPSY  02/03/2018   Procedure: BIOPSY;  Surgeon: West Bali, MD;  [facility];  Service: Endoscopy;;  duodenum,gastric   CATARACT EXTRACTION W/PHACO Right 08/05/2020   Procedure: CATARACT EXTRACTION PHACO AND INTRAOCULAR LENS PLACEMENT (IOC);  Surgeon:  Fabio Pierce, MD;  Location: [facility];  Service: Ophthalmology;  Laterality: Right;  CDE: 13.10   CATARACT EXTRACTION W/PHACO Left 08/19/2020   Procedure: CATARACT EXTRACTION PHACO AND INTRAOCULAR LENS PLACEMENT (IOC);  Surgeon: Fabio Pierce, MD;  Location: [facility];  Service: Ophthalmology;  Laterality: Left;  CDE  7.16   CESAREAN SECTION  1985   COLONOSCOPY WITH PROPOFOL N/A 02/03/2018   Procedure: COLONOSCOPY WITH PROPOFOL;  Surgeon: West Bali, MD;  [facility];  Service: Endoscopy;  Laterality: N/A;  7:30am   ESOPHAGOGASTRODUODENOSCOPY (EGD) WITH PROPOFOL N/A 02/03/2018   Procedure: ESOPHAGOGASTRODUODENOSCOPY (EGD) WITH PROPOFOL;  Surgeon: West Bali, MD;  [facility];  Service: Endoscopy;  Laterality: N/A;    Family History  Problem Relation Age of Onset   Hypertension Mother    Hypertension Sister    Diabetes Sister    Heart disease Sister    Colon cancer Neg Hx    Colon polyps Neg Hx     Social History   Tobacco Use   Smoking status: Never   Smokeless tobacco: Never  Vaping Use   Vaping Use: Never used  Substance Use Topics   Alcohol use: Never   Drug use: Never    ROS   Objective:   Vitals: BP (!) 171/95 (BP Location: Left Arm)   Pulse 84   Temp 98.8 F (37.1  C) (Oral)   Resp 18   SpO2 95%   Physical Exam Constitutional:      General: She is not in acute distress.    Appearance: Normal appearance. She is well-developed. She is not ill-appearing, toxic-appearing or diaphoretic.  HENT:     Head: Normocephalic and atraumatic.     Right Ear: Tympanic membrane and ear canal normal. No drainage or tenderness. No middle ear effusion. Tympanic membrane is not erythematous.     Left Ear: Tympanic membrane and ear canal normal. No drainage or tenderness.  No middle ear effusion. Tympanic membrane is not erythematous.     Nose: Nose normal. No congestion or rhinorrhea.     Mouth/Throat:     Mouth: Mucous membranes are moist. No oral  lesions.     Pharynx: No pharyngeal swelling, oropharyngeal exudate, posterior oropharyngeal erythema or uvula swelling.     Tonsils: No tonsillar exudate or tonsillar abscesses.  Eyes:     General: No scleral icterus.    Extraocular Movements: Extraocular movements intact.     Right eye: Normal extraocular motion.     Left eye: Normal extraocular motion.     Conjunctiva/sclera: Conjunctivae normal.     Pupils: Pupils are equal, round, and reactive to light.  Cardiovascular:     Rate and Rhythm: Normal rate and regular rhythm.     Pulses: Normal pulses.     Heart sounds: Normal heart sounds. No murmur heard.   No friction rub. No gallop.  Pulmonary:     Effort: Pulmonary effort is normal. No respiratory distress.     Breath sounds: Normal breath sounds. No stridor. No wheezing, rhonchi or rales.  Musculoskeletal:     Cervical back: Normal range of motion and neck supple.  Lymphadenopathy:     Cervical: No cervical adenopathy.  Skin:    General: Skin is warm and dry.     Findings: No rash.  Neurological:     General: No focal deficit present.     Mental Status: She is alert and oriented to person, place, and time.  Psychiatric:        Mood and Affect: Mood normal.        Behavior: Behavior normal.        Thought Content: Thought content normal.    Results for orders placed or performed during the hospital encounter of 06/21/21 (from the past 24 hour(s))  POCT rapid strep A     Status: None   Collection Time: 06/21/21 11:18 AM  Result Value Ref Range   Rapid Strep A Screen Negative Negative    Assessment and Plan :   PDMP not reviewed this encounter.  1. Viral upper respiratory illness   2. Throat pain   3. Nasal congestion   4. Subacute cough   5. Essential hypertension   6. Elevated blood pressure reading    Suspect viral URI, viral pharyngitis; physical exam findings reassuring and vital signs stable for discharge. Advised supportive care, offered symptomatic  relief.  Throat culture pending.  Patient refused COVID-19 test. Deferred imaging given clear cardiopulmonary exam, hemodynamically stable vital signs.  Counseled patient on potential for adverse effects with medications prescribed/recommended today, ER and return-to-clinic precautions discussed, patient verbalized understanding.     Wallis Bamberg, PA-C 06/21/21 1331

## 2021-06-21 NOTE — ED Triage Notes (Signed)
Patient c/o  nonproductive cough x 2 days.   Patient c/o nasal congestion and sore throat x 1 day.   Patient denies fever and generalized body aches.   Patient endorses sinus pressure and fatigue.   Patient denies N/V/D.   Patient has taken Mucinex DM with no relief of symptoms.

## 2021-06-24 LAB — CULTURE, GROUP A STREP (THRC)

## 2021-07-05 ENCOUNTER — Ambulatory Visit: Payer: Medicare HMO | Admitting: Internal Medicine

## 2021-07-10 ENCOUNTER — Encounter: Payer: Self-pay | Admitting: Internal Medicine

## 2021-07-11 ENCOUNTER — Other Ambulatory Visit: Payer: Self-pay | Admitting: Nurse Practitioner

## 2021-07-25 ENCOUNTER — Ambulatory Visit (INDEPENDENT_AMBULATORY_CARE_PROVIDER_SITE_OTHER): Payer: Medicare HMO

## 2021-07-25 ENCOUNTER — Other Ambulatory Visit: Payer: Self-pay

## 2021-07-25 VITALS — Ht 63.0 in | Wt 179.0 lb

## 2021-07-25 DIAGNOSIS — Z0000 Encounter for general adult medical examination without abnormal findings: Secondary | ICD-10-CM

## 2021-07-25 NOTE — Patient Instructions (Signed)
[name] , Thank you for taking time to come for your Medicare Wellness Visit. I appreciate your ongoing commitment to your health goals. Please review the following plan we discussed and let me know if I can assist you in the future.   Screening recommendations/referrals: Colonoscopy: Done 02/03/2018 Repeat in 10 years  Mammogram: Done 11/21/2020 Repeat annually  Bone Density: Done 05/12/2020 Repeat every 2 years  Recommended yearly ophthalmology/optometry visit for glaucoma screening and checkup Recommended yearly dental visit for hygiene and checkup  Vaccinations: Influenza vaccine: 07/25/2021 Repeat annually  Pneumococcal vaccine: Done 10/18/2015 and 10/22/2016 Tdap vaccine: Due Repeat in 10 years  Shingles vaccine: Shingrix discussed. Please contact your pharmacy for coverage information.     Covid-19:Done 12/23/2019 and 01/22/2020  Advanced directives: Advance directive discussed with you today. I have provided a copy for you to complete at home and have notarized. Once this is complete please bring a copy in to our office so we can scan it into your chart.   Conditions/risks identified: Aim for 30 minutes of exercise or brisk walking each day, drink 6-8 glasses of water and eat lots of fruits and vegetables.   Next appointment: Follow up in one year for your annual wellness visit 2023.   Preventive Care 72 Years and Older, Female Preventive care refers to lifestyle choices and visits with your health care provider that can promote health and wellness. What does preventive care include? A yearly physical exam. This is also called an annual well check. Dental exams once or twice a year. Routine eye exams. Ask your health care provider how often you should have your eyes checked. Personal lifestyle choices, including: Daily care of your teeth and gums. Regular physical activity. Eating a healthy diet. Avoiding tobacco and drug use. Limiting alcohol use. Practicing safe  sex. Taking low-dose aspirin every day. Taking vitamin and mineral supplements as recommended by your health care provider. What happens during an annual well check? The services and screenings done by your health care provider during your annual well check will depend on your age, overall health, lifestyle risk factors, and family history of disease. Counseling  Your health care provider may ask you questions about your: Alcohol use. Tobacco use. Drug use. Emotional well-being. Home and relationship well-being. Sexual activity. Eating habits. History of falls. Memory and ability to understand (cognition). Work and work Astronomer. Reproductive health. Screening  You may have the following tests or measurements: Height, weight, and BMI. Blood pressure. Lipid and cholesterol levels. These may be checked every 5 years, or more frequently if you are over 72 years old. Skin check. Lung cancer screening. You may have this screening every year starting at age 72 if you have a 30-pack-year history of smoking and currently smoke or have quit within the past 15 years. Fecal occult blood test (FOBT) of the stool. You may have this test every year starting at age 72. Flexible sigmoidoscopy or colonoscopy. You may have a sigmoidoscopy every 5 years or a colonoscopy every 10 years starting at age 72. Hepatitis C blood test. Hepatitis B blood test. Sexually transmitted disease (STD) testing. Diabetes screening. This is done by checking your blood sugar (glucose) after you have not eaten for a while (fasting). You may have this done every 1-3 years. Bone density scan. This is done to screen for osteoporosis. You may have this done starting at age 72. Mammogram. This may be done every 1-2 years. Talk to your health care provider about how often you should  have regular mammograms. Talk with your health care provider about your test results, treatment options, and if necessary, the need for more  tests. Vaccines  Your health care provider may recommend certain vaccines, such as: Influenza vaccine. This is recommended every year. Tetanus, diphtheria, and acellular pertussis (Tdap, Td) vaccine. You may need a Td booster every 10 years. Zoster vaccine. You may need this after age 72. Pneumococcal 13-valent conjugate (PCV13) vaccine. One dose is recommended after age 72. Pneumococcal polysaccharide (PPSV23) vaccine. One dose is recommended after age 72. Talk to your health care provider about which screenings and vaccines you need and how often you need them. This information is not intended to replace advice given to you by your health care provider. Make sure you discuss any questions you have with your health care provider. Document Released: 09/16/2015 Document Revised: 05/09/2016 Document Reviewed: 06/21/2015 Elsevier Interactive Patient Education  2017 [place] Prevention in the Home Falls can cause injuries. They can happen to people of all ages. There are many things you can do to make your home safe and to help prevent falls. What can I do on the outside of my home? Regularly fix the edges of walkways and driveways and fix any cracks. Remove anything that might make you trip as you walk through a door, such as a raised step or threshold. Trim any bushes or trees on the path to your home. Use bright outdoor lighting. Clear any walking paths of anything that might make someone trip, such as rocks or tools. Regularly check to see if handrails are loose or broken. Make sure that both sides of any steps have handrails. Any raised decks and porches should have guardrails on the edges. Have any leaves, snow, or ice cleared regularly. Use sand or salt on walking paths during winter. Clean up any spills in your garage right away. This includes oil or grease spills. What can I do in the bathroom? Use night lights. Install grab bars by the toilet and in the tub and shower.  Do not use towel bars as grab bars. Use non-skid mats or decals in the tub or shower. If you need to sit down in the shower, use a plastic, non-slip stool. Keep the floor dry. Clean up any water that spills on the floor as soon as it happens. Remove soap buildup in the tub or shower regularly. Attach bath mats securely with double-sided non-slip rug tape. Do not have throw rugs and other things on the floor that can make you trip. What can I do in the bedroom? Use night lights. Make sure that you have a light by your bed that is easy to reach. Do not use any sheets or blankets that are too big for your bed. They should not hang down onto the floor. Have a firm chair that has side arms. You can use this for support while you get dressed. Do not have throw rugs and other things on the floor that can make you trip. What can I do in the kitchen? Clean up any spills right away. Avoid walking on wet floors. Keep items that you use a lot in easy-to-reach places. If you need to reach something above you, use a strong step stool that has a grab bar. Keep electrical cords out of the way. Do not use floor polish or wax that makes floors slippery. If you must use wax, use non-skid floor wax. Do not have throw rugs and other things on the floor  that can make you trip. What can I do with my stairs? Do not leave any items on the stairs. Make sure that there are handrails on both sides of the stairs and use them. Fix handrails that are broken or loose. Make sure that handrails are as long as the stairways. Check any carpeting to make sure that it is firmly attached to the stairs. Fix any carpet that is loose or worn. Avoid having throw rugs at the top or bottom of the stairs. If you do have throw rugs, attach them to the floor with carpet tape. Make sure that you have a light switch at the top of the stairs and the bottom of the stairs. If you do not have them, ask someone to add them for you. What else  can I do to help prevent falls? Wear shoes that: Do not have high heels. Have rubber bottoms. Are comfortable and fit you well. Are closed at the toe. Do not wear sandals. If you use a stepladder: Make sure that it is fully opened. Do not climb a closed stepladder. Make sure that both sides of the stepladder are locked into place. Ask someone to hold it for you, if possible. Clearly mark and make sure that you can see: Any grab bars or handrails. First and last steps. Where the edge of each step is. Use tools that help you move around (mobility aids) if they are needed. These include: Canes. Walkers. Scooters. Crutches. Turn on the lights when you go into a dark area. Replace any light bulbs as soon as they burn out. Set up your furniture so you have a clear path. Avoid moving your furniture around. If any of your floors are uneven, fix them. If there are any pets around you, be aware of where they are. Review your medicines with your doctor. Some medicines can make you feel dizzy. This can increase your chance of falling. Ask your doctor what other things that you can do to help prevent falls. This information is not intended to replace advice given to you by your health care provider. Make sure you discuss any questions you have with your health care provider. Document Released: 06/16/2009 Document Revised: 01/26/2016 Document Reviewed: 09/24/2014 Elsevier Interactive Patient Education  2017 [employer].

## 2021-07-25 NOTE — Progress Notes (Signed)
Subjective:   Melissa Wiley is a 70 y.o. female who presents for an Initial Medicare Annual Wellness Visit.  Review of Systems     Cardiac Risk Factors include: advanced age (>23men, >31 women);sedentary lifestyle;obesity (BMI >30kg/m2);hypertension     Objective:    Today's Vitals   07/25/21 1425  Weight: 179 lb (81.2 kg)  Height: 5\' 3"  (1.6 m)   Body mass index is 31.71 kg/m.  Advanced Directives 07/25/2021 08/19/2020 08/05/2020 08/03/2020 02/03/2018 01/28/2018  Does Patient Have a Medical Advance Directive? No No No No No No  Would patient like information on creating a medical advance directive? No - Patient declined - No - Patient declined No - Patient declined No - Patient declined Yes (MAU/Ambulatory/Procedural Areas - Information given)    Current Medications (verified) Outpatient Encounter Medications as of 07/25/2021  Medication Sig   ALPRAZolam (XANAX) 0.5 MG tablet TAKE 1/2- 1  TABLET BY MOUTH DURING THE DAY AS NEEDED FOR EXTREME ANXIETY AND ONE TABLET BY MOUTH EVERY NIGHT AT BEDTIME AS NEEDED FOR SLEEP   cetirizine (ZYRTEC) 5 MG tablet Take 1 tablet (5 mg total) by mouth daily.   enalapril (VASOTEC) 20 MG tablet Take 1 tablet (20 mg total) by mouth daily.   fexofenadine (ALLEGRA) 180 MG tablet Take 180 mg by mouth daily as needed for allergies or rhinitis.   hydrochlorothiazide (HYDRODIURIL) 25 MG tablet Take 1/2 (one-half) tablet by mouth once daily   Multiple Vitamins-Minerals (MULTIVITAMIN WITH MINERALS) tablet Take 1 tablet by mouth daily.   Omega-3 Fatty Acids (FISH OIL) 1000 MG CAPS Take 1,000 mg by mouth daily.   pantoprazole (PROTONIX) 40 MG tablet Take 1 tablet (40 mg total) by mouth daily.   PARoxetine (PAXIL) 20 MG tablet TAKE 2 TABLETS BY MOUTH IN THE MORNING   phenol (CHLORASEPTIC) 1.4 % LIQD Use as directed 1 spray in the mouth or throat 3 (three) times daily as needed for throat irritation / pain.   promethazine-dextromethorphan (PROMETHAZINE-DM)  6.25-15 MG/5ML syrup Take 5 mLs by mouth at bedtime as needed for cough.   No facility-administered encounter medications on file as of 07/25/2021.    Allergies (verified) Patient has no known allergies.   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   Headache    Hx of migraines    Hypertension    Insomnia    Stress    Past Surgical History:  Procedure Laterality Date   BIOPSY  02/03/2018   Procedure: BIOPSY;  Surgeon: 04/05/2018, MD;  Location: [facility];  Service: Endoscopy;;  duodenum,gastric   CATARACT EXTRACTION W/PHACO Right 08/05/2020   Procedure: CATARACT EXTRACTION PHACO AND INTRAOCULAR LENS PLACEMENT (IOC);  Surgeon: 14/11/2019, MD;  Location: [facility];  Service: Ophthalmology;  Laterality: Right;  CDE: 13.10   CATARACT EXTRACTION W/PHACO Left 08/19/2020   Procedure: CATARACT EXTRACTION PHACO AND INTRAOCULAR LENS PLACEMENT (IOC);  Surgeon: 08/21/2020, MD;  Location: [facility];  Service: Ophthalmology;  Laterality: Left;  CDE  7.16   CESAREAN SECTION  1985   COLONOSCOPY WITH PROPOFOL N/A 02/03/2018   Procedure: COLONOSCOPY WITH PROPOFOL;  Surgeon: 04/05/2018, MD;  Location: [facility];  Service: Endoscopy;  Laterality: N/A;  7:30am   ESOPHAGOGASTRODUODENOSCOPY (EGD) WITH PROPOFOL N/A 02/03/2018   Procedure: ESOPHAGOGASTRODUODENOSCOPY (EGD) WITH PROPOFOL;  Surgeon: 04/05/2018, MD;  Location: [facility];  Service: Endoscopy;  Laterality: N/A;   Family History  Problem Relation Age of Onset   Hypertension Mother    Hypertension Sister    Diabetes Sister    Heart disease Sister    Colon cancer Neg Hx    Colon polyps Neg Hx    Social History   Socioeconomic History   Marital status: Single    Spouse name: Not on file   Number of children: 2   Years of education: Not on file   Highest education level: Not on file  Occupational History   Not on file  Tobacco Use    Smoking status: Never   Smokeless tobacco: Never  Vaping Use   Vaping Use: Never used  Substance and Sexual Activity   Alcohol use: Never   Drug use: Never   Sexual activity: Yes  Other Topics Concern   Not on file  Social History Narrative   SPECIAL NEEDS DAUGHTER PASSED IN 2019.   2 living daughters.   6 grandchildren   6 great grandchildren   Social Determinants of Corporate investment banker Strain: Low Risk    Difficulty of Paying Living Expenses: Not hard at all  Food Insecurity: No Food Insecurity   Worried About Programme researcher, broadcasting/film/video in the Last Year: Never true   Barista in the Last Year: Never true  Transportation Needs: No Transportation Needs   Lack of Transportation (Medical): No   Lack of Transportation (Non-Medical): No  Physical Activity: Sufficiently Active   Days of Exercise per Week: 5 days   Minutes of Exercise per Session: 30 min  Stress: No Stress Concern Present   Feeling of Stress : Not at all  Social Connections: Moderately Integrated   Frequency of Communication with Friends and Family: More than three times a week   Frequency of Social Gatherings with Friends and Family: More than three times a week   Attends Religious Services: More than 4 times per year   Active Member of [employer] or Organizations: Yes   Attends Engineer, structural: More than 4 times per year   Marital Status: Never married    Tobacco Counseling Counseling given: Not Answered   Clinical Intake:  Pre-visit preparation completed: Yes  Pain : No/denies pain     BMI - recorded: 31.71 Nutritional Status: BMI > 30  Obese Nutritional Risks: None Diabetes: No  How often do you need to have someone help you when you read instructions, pamphlets, or other written materials from your doctor or pharmacy?: 1 - Never  Diabetic?No  Interpreter Needed?: No  Information entered by :: Melissa Mitali Shenefield, LPN   Activities of Daily Living In your present state of  health, do you have any difficulty performing the following activities: 07/25/2021 08/03/2020  Hearing? N N  Vision? N N  Difficulty concentrating or making decisions? N N  Walking or climbing stairs? N N  Dressing or bathing? N N  Doing errands, shopping? N N  Preparing Food and eating ? N -  Using the Toilet? N -  In the past six months, have you accidently leaked urine? N -  Do you have problems with loss of bowel control? N -  Managing your Medications? N -  Managing your Finances? N -  Housekeeping or managing your Housekeeping? N -  Some recent data might be hidden    Patient Care Team: Tommie Sams, DO as PCP - General (Family Medicine) Jonelle Sidle, MD as  PCP - Cardiology (Cardiology) West Bali, MD (Inactive) as Consulting Physician (Gastroenterology)  Indicate any recent Medical Services you may have received from other than [facility] providers in the past year (date may be approximate).     Assessment:   This is a routine wellness examination for [place].  Hearing/Vision screen Hearing Screening - Comments:: No hearing issues.  Vision Screening - Comments:: [facility] 2021.  Dietary issues and exercise activities discussed: Current Exercise Habits: Home exercise routine, Type of exercise: walking, Time (Minutes): 30, Frequency (Times/Week): 5, Weekly Exercise (Minutes/Week): 150, Intensity: Mild, Exercise limited by: cardiac condition(s)   Goals Addressed             This Visit's Progress    Exercise 3x per week (30 min per time)       Pt states she would like to exercise more.        Depression Screen PHQ 2/9 Scores 07/25/2021 02/10/2021 07/08/2020 03/22/2020 09/29/2019 10/28/2018 05/01/2018  PHQ - 2 Score 0 0 0 1 3 1 2   PHQ- 9 Score - 1 0 4 9 3 5   Exception Documentation - - - - - - -  Not completed - - - - - - -    Fall Risk Fall Risk  07/25/2021 02/10/2021 03/22/2020 10/18/2015  Falls in the past year? 0 0 0 No  Number falls in past yr:  0 0 - -  Injury with Fall? 0 0 - -  Risk for fall due to : No Fall Risks - - -  Follow up Falls prevention discussed Falls evaluation completed Falls evaluation completed -    FALL RISK PREVENTION PERTAINING TO THE HOME:  Any stairs in or around the home? No  If so, are there any without handrails? No  Home free of loose throw rugs in walkways, pet beds, electrical cords, etc? Yes  Adequate lighting in your home to reduce risk of falls? Yes   ASSISTIVE DEVICES UTILIZED TO PREVENT FALLS:  Life alert? No  Use of a cane, walker or w/c? No  Grab bars in the bathroom? No  Shower chair or bench in shower? No  Elevated toilet seat or a handicapped toilet? No   TIMED UP AND GO:  Was the test performed? Yes .  Length of time to ambulate 10 feet: 8 sec.   Gait steady and fast without use of assistive device  Cognitive Function:     6CIT Screen 07/25/2021  What Year? 0 points  What month? 0 points  What time? 0 points  Count back from 20 0 points  Months in reverse 2 points  Repeat phrase 2 points  Total Score 4    Immunizations Immunization History  Administered Date(s) Administered   Influenza, High Dose Seasonal PF 07/07/2019   Influenza,inj,Quad PF,6+ Mos 05/01/2018, 07/08/2020   Influenza-Unspecified 07/10/2013, 07/13/2014, 06/07/2016, 07/08/2017, 07/07/2019   Moderna Sars-Covid-2 Vaccination 12/23/2019, 01/22/2020   Pneumococcal Conjugate-13 10/22/2016   Pneumococcal Polysaccharide-23 10/18/2015    TDAP status: Due, Education has been provided regarding the importance of this vaccine. Advised may receive this vaccine at local pharmacy or Health Dept. Aware to provide a copy of the vaccination record if obtained from local pharmacy or Health Dept. Verbalized acceptance and understanding.  Flu Vaccine status: Due, Education has been provided regarding the importance of this vaccine. Advised may receive this vaccine at local pharmacy or Health Dept. Aware to provide a  copy of the vaccination record if obtained from local pharmacy or Health  Dept. Verbalized acceptance and understanding.  Pneumococcal vaccine status: Up to date  Covid-19 vaccine status: Information provided on how to obtain vaccines.   Qualifies for Shingles Vaccine? Yes   Zostavax completed No   Shingrix Completed?: No.    Education has been provided regarding the importance of this vaccine. Patient has been advised to call insurance company to determine out of pocket expense if they have not yet received this vaccine. Advised may also receive vaccine at local pharmacy or Health Dept. Verbalized acceptance and understanding.  Screening Tests Health Maintenance  Topic Date Due   Zoster Vaccines- Shingrix (1 of 2) Never done   COVID-19 Vaccine (3 - Booster for Moderna series) 03/18/2020   INFLUENZA VACCINE  12/01/2021 (Originally 04/03/2021)   TETANUS/TDAP  05/12/2022 (Originally 11/26/1969)   Hepatitis C Screening  05/12/2022 (Originally 11/26/1968)   MAMMOGRAM  11/22/2022   COLONOSCOPY (Pts 45-11yrs Insurance coverage will need to be confirmed)  02/04/2028   Pneumonia Vaccine 45+ Years old  Completed   DEXA SCAN  Completed   HPV VACCINES  Aged Out    Health Maintenance  Health Maintenance Due  Topic Date Due   Zoster Vaccines- Shingrix (1 of 2) Never done   COVID-19 Vaccine (3 - Booster for Moderna series) 03/18/2020    Colorectal cancer screening: Type of screening: Colonoscopy. Completed 02/03/2018. Repeat every 10 years  Mammogram status: Completed 11/21/2020. Repeat every year  Bone Density status: Completed 05/12/2020. Results reflect: Bone density results: OSTEOPOROSIS. Repeat every 2 years.  Lung Cancer Screening: (Low Dose CT Chest recommended if Age 38-80 years, 30 pack-year currently smoking OR have quit w/in 15years.) does not qualify.  Non smoker.  Additional Screening:  Hepatitis C Screening: does qualify; Completed Not completed.  Vision Screening: Recommended  annual ophthalmology exams for early detection of glaucoma and other disorders of the eye. Is the patient up to date with their annual eye exam?  Yes  Who is the provider or what is the name of the office in which the patient attends annual eye exams? Dr. Charise Killian.  If pt is not established with a provider, would they like to be referred to a provider to establish care? No .   Dental Screening: Recommended annual dental exams for proper oral hygiene  Community Resource Referral / Chronic Care Management: CRR required this visit?  No   CCM required this visit?  No      Plan:     I have personally reviewed and noted the following in the patient's chart:   Medical and social history Use of alcohol, tobacco or illicit drugs  Current medications and supplements including opioid prescriptions. Patient is not currently taking opioid prescriptions. Functional ability and status Nutritional status Physical activity Advanced directives List of other physicians Hospitalizations, surgeries, and ER visits in previous 12 months Vitals Screenings to include cognitive, depression, and falls Referrals and appointments  In addition, I have reviewed and discussed with patient certain preventive protocols, quality metrics, and best practice recommendations. A written personalized care plan for preventive services as well as general preventive health recommendations were provided to patient.     Darral Dash, LPN   38/06/1750   Nurse Notes: In person visit. Pt states she is doing well. Up to date on health maintenance. Unable to give flu vaccine at visit today due to being out of the vaccine. Discussed shingles and how to obtain. 6CIT score of 4.

## 2021-08-19 ENCOUNTER — Other Ambulatory Visit: Payer: Self-pay | Admitting: Nurse Practitioner

## 2021-08-29 MED ORDER — ALPRAZOLAM 0.5 MG PO TABS: ORAL_TABLET | ORAL | 0 refills | Status: DC

## 2021-08-29 NOTE — Telephone Encounter (Signed)
Please contact patient to have schedule est care visit with Dr.Cook. thank you.

## 2021-08-29 NOTE — Telephone Encounter (Signed)
Please ask patient to schedule an appointment with Dr. Adriana Simas so she can meet him. I will send in a refill on her meds. Thanks.

## 2021-09-28 ENCOUNTER — Ambulatory Visit: Payer: Medicare HMO | Admitting: Internal Medicine

## 2021-10-04 ENCOUNTER — Encounter: Payer: Self-pay | Admitting: Internal Medicine

## 2021-10-05 ENCOUNTER — Ambulatory Visit: Payer: Medicare HMO | Admitting: Internal Medicine

## 2021-10-07 ENCOUNTER — Other Ambulatory Visit: Payer: Self-pay | Admitting: Nurse Practitioner

## 2021-11-08 ENCOUNTER — Ambulatory Visit: Payer: Medicare HMO | Admitting: Internal Medicine

## 2021-11-09 ENCOUNTER — Other Ambulatory Visit: Payer: Self-pay

## 2021-11-09 ENCOUNTER — Ambulatory Visit (INDEPENDENT_AMBULATORY_CARE_PROVIDER_SITE_OTHER): Payer: Medicare HMO | Admitting: Family Medicine

## 2021-11-09 ENCOUNTER — Encounter: Payer: Self-pay | Admitting: Family Medicine

## 2021-11-09 VITALS — BP 130/84 | HR 71 | Temp 99.3°F | Wt 179.8 lb

## 2021-11-09 DIAGNOSIS — K219 Gastro-esophageal reflux disease without esophagitis: Secondary | ICD-10-CM | POA: Diagnosis not present

## 2021-11-09 DIAGNOSIS — E785 Hyperlipidemia, unspecified: Secondary | ICD-10-CM | POA: Diagnosis not present

## 2021-11-09 DIAGNOSIS — F419 Anxiety disorder, unspecified: Secondary | ICD-10-CM

## 2021-11-09 DIAGNOSIS — M8580 Other specified disorders of bone density and structure, unspecified site: Secondary | ICD-10-CM

## 2021-11-09 DIAGNOSIS — I10 Essential (primary) hypertension: Secondary | ICD-10-CM

## 2021-11-09 DIAGNOSIS — F32A Depression, unspecified: Secondary | ICD-10-CM

## 2021-11-09 DIAGNOSIS — Z8619 Personal history of other infectious and parasitic diseases: Secondary | ICD-10-CM | POA: Insufficient documentation

## 2021-11-09 DIAGNOSIS — R69 Illness, unspecified: Secondary | ICD-10-CM | POA: Diagnosis not present

## 2021-11-09 DIAGNOSIS — R7309 Other abnormal glucose: Secondary | ICD-10-CM

## 2021-11-09 DIAGNOSIS — Z862 Personal history of diseases of the blood and blood-forming organs and certain disorders involving the immune mechanism: Secondary | ICD-10-CM | POA: Diagnosis not present

## 2021-11-09 NOTE — Patient Instructions (Signed)
Labs today. ? ?Calcium - total 1200 mg daily (including dietary sources). I recommend 2000 IU daily of Vitamin D3. ? ?Follow up in 6 months with Eber Jones or myself. ? ?Take care ? ?Dr. Adriana Simas  ?

## 2021-11-10 NOTE — Progress Notes (Signed)
? ?Subjective:  ?Patient ID: Melissa Wiley, female    DOB: 11/16/1950  Age: 71 y.o. MRN: [id number] ? ?CC: ?Chief Complaint  ?Patient presents with  ? Establish Care  ? ? ?HPI: ? ?71 year old female presents to establish care with me. ? ?Patient states that she is doing well.  She has no significant concerns at this time. ? ?Hypertension is well controlled on HCTZ and enalapril. ? ?GERD is stable on Protonix. ? ?Anxiety and depression stable on Paxil as well as Xanax. ? ?Last available labs reviewed.  Notable results: LDL 115 and triglycerides 207.  Mildly elevated glucose of 118. ? ?The patient's most recent DEXA scan was reviewed.  Osteopenia noted.  Patient is not taking calcium or vitamin D regularly.  We will discuss this today. ? ?Patient Active Problem List  ? Diagnosis Date Noted  ? History of Helicobacter pylori infection 11/09/2021  ? Hyperlipidemia 11/09/2021  ? Osteopenia 11/09/2021  ? Prediabetes 03/22/2020  ? GERD (gastroesophageal reflux disease) 02/04/2019  ? Arthritis of left knee 10/26/2014  ? Insomnia 10/26/2014  ? Anxiety and depression 10/26/2014  ? Essential hypertension, benign 02/03/2014  ? ? ?Social Hx   ?Social History  ? ?Socioeconomic History  ? Marital status: Single  ?  Spouse name: Not on file  ? Number of children: 2  ? Years of education: Not on file  ? Highest education level: Not on file  ?Occupational History  ? Not on file  ?Tobacco Use  ? Smoking status: Never  ? Smokeless tobacco: Never  ?Vaping Use  ? Vaping Use: Never used  ?Substance and Sexual Activity  ? Alcohol use: Never  ? Drug use: Never  ? Sexual activity: Yes  ?Other Topics Concern  ? Not on file  ?Social History Narrative  ? SPECIAL NEEDS DAUGHTER PASSED IN 2019.  ? 2 living daughters.  ? 6 grandchildren  ? 6 great grandchildren  ? ?Social Determinants of Health  ? ?Financial Resource Strain: Low Risk   ? Difficulty of Paying Living Expenses: Not hard at all  ?Food Insecurity: No Food Insecurity  ? Worried About  Charity fundraiser in the Last Year: Never true  ? Ran Out of Food in the Last Year: Never true  ?Transportation Needs: No Transportation Needs  ? Lack of Transportation (Medical): No  ? Lack of Transportation (Non-Medical): No  ?Physical Activity: Sufficiently Active  ? Days of Exercise per Week: 5 days  ? Minutes of Exercise per Session: 30 min  ?Stress: No Stress Concern Present  ? Feeling of Stress : Not at all  ?Social Connections: Moderately Integrated  ? Frequency of Communication with Friends and Family: More than three times a week  ? Frequency of Social Gatherings with Friends and Family: More than three times a week  ? Attends Religious Services: More than 4 times per year  ? Active Member of Clubs or Organizations: Yes  ? Attends Archivist Meetings: More than 4 times per year  ? Marital Status: Never married  ? ? ?Review of Systems  ?Constitutional: Negative.   ?Respiratory: Negative.    ?Cardiovascular: Negative.   ? ? ?Objective:  ?BP 130/84   Pulse 71   Temp 99.3 ?F (37.4 ?C)   Wt 179 lb 12.8 oz (81.6 kg)   SpO2 97%   BMI 31.85 kg/m?  ? ?BP/Weight 11/09/2021 07/25/2021 06/21/2021  ?Systolic BP 676 - 720  ?Diastolic BP 84 - 95  ?Wt. (Lbs) 179.8 179 -  ?BMI  31.85 31.71 -  ? ? ?Physical Exam ?Constitutional:   ?   General: She is not in acute distress. ?   Appearance: Normal appearance. She is not ill-appearing.  ?HENT:  ?   Head: Normocephalic and atraumatic.  ?Eyes:  ?   General:     ?   Right eye: No discharge.     ?   Left eye: No discharge.  ?   Conjunctiva/sclera: Conjunctivae normal.  ?Cardiovascular:  ?   Rate and Rhythm: Normal rate and regular rhythm.  ?   Heart sounds: No murmur heard. ?Pulmonary:  ?   Effort: Pulmonary effort is normal.  ?   Breath sounds: Normal breath sounds. No wheezing or rales.  ?Abdominal:  ?   General: There is no distension.  ?   Palpations: Abdomen is soft.  ?   Tenderness: There is no abdominal tenderness.  ?Neurological:  ?   Mental Status: She is  alert.  ?Psychiatric:     ?   Mood and Affect: Mood normal.     ?   Behavior: Behavior normal.  ? ? ? ? ?Lab Results  ?Component Value Date  ? WBC 7.0 02/16/2021  ? HGB 11.5 02/16/2021  ? HCT 37.3 02/16/2021  ? PLT 311 02/16/2021  ? GLUCOSE 118 (H) 02/16/2021  ? CHOL 195 02/16/2021  ? TRIG 207 (H) 02/16/2021  ? HDL 44 02/16/2021  ? LDLCALC 115 (H) 02/16/2021  ? ALT 10 02/16/2021  ? AST 10 02/16/2021  ? NA 142 02/16/2021  ? K 3.8 02/16/2021  ? CL 101 02/16/2021  ? CREATININE 0.73 02/16/2021  ? BUN 12 02/16/2021  ? CO2 27 02/16/2021  ? TSH 1.450 11/12/2019  ? HGBA1C 5.7 (H) 08/03/2020  ? ? ? ?Assessment & Plan:  ? ?Problem List Items Addressed This Visit   ? ?  ? Cardiovascular and Mediastinum  ? Essential hypertension, benign - Primary  ?  Stable.  Continue enalapril and HCTZ.  Labs ordered. ?  ?  ? Relevant Orders  ? CMP14+EGFR  ?  ? Digestive  ? GERD (gastroesophageal reflux disease)  ?  Stable on Protonix.  Continue. ?  ?  ?  ? Musculoskeletal and Integument  ? Osteopenia  ?  Advised good calcium intake with a total of 1200 mg daily including dietary sources. ?Recommended 2000 IU of vitamin D3 daily. ?  ?  ?  ? Other  ? Anxiety and depression  ?  Stable on Paxil and Xanax.  Continue. ?  ?  ? Hyperlipidemia  ?  Lipid panel ordered. ?  ?  ? Relevant Orders  ? CMP14+EGFR  ? Lipid panel  ? ?Other Visit Diagnoses   ? ? History of anemia      ? Relevant Orders  ? CBC  ? Elevated random blood glucose level      ? Relevant Orders  ? Hemoglobin A1c  ? ?  ? ?Follow-up:  Return in about 6 months (around 05/12/2022). ? ?Thersa Salt DO ?[place] ? ?

## 2021-11-10 NOTE — Assessment & Plan Note (Signed)
Stable on Protonix.  Continue. 

## 2021-11-10 NOTE — Assessment & Plan Note (Signed)
Stable.  Continue enalapril and HCTZ.  Labs ordered. ?

## 2021-11-10 NOTE — Assessment & Plan Note (Signed)
Advised good calcium intake with a total of 1200 mg daily including dietary sources. ?Recommended 2000 IU of vitamin D3 daily. ?

## 2021-11-10 NOTE — Assessment & Plan Note (Signed)
Stable on Paxil and Xanax.  Continue. ?

## 2021-11-10 NOTE — Assessment & Plan Note (Signed)
Lipid panel ordered.

## 2021-11-20 ENCOUNTER — Other Ambulatory Visit: Payer: Self-pay | Admitting: Nurse Practitioner

## 2021-11-20 DIAGNOSIS — K219 Gastro-esophageal reflux disease without esophagitis: Secondary | ICD-10-CM

## 2021-11-20 DIAGNOSIS — I10 Essential (primary) hypertension: Secondary | ICD-10-CM

## 2021-11-21 ENCOUNTER — Telehealth: Payer: Self-pay | Admitting: Family Medicine

## 2021-11-21 ENCOUNTER — Other Ambulatory Visit: Payer: Self-pay | Admitting: Family Medicine

## 2021-11-21 ENCOUNTER — Other Ambulatory Visit: Payer: Self-pay

## 2021-11-21 MED ORDER — ALPRAZOLAM 0.5 MG PO TABS: ORAL_TABLET | ORAL | 0 refills | Status: DC

## 2021-11-21 NOTE — Telephone Encounter (Signed)
[facility] [place] requesting refill on Alprazolam 0.5 mg tablet. Pt last seen 11/09/21 for HTN. Please advise. Thank you! ?

## 2021-11-22 ENCOUNTER — Encounter: Payer: Self-pay | Admitting: Internal Medicine

## 2021-11-22 ENCOUNTER — Other Ambulatory Visit: Payer: Self-pay

## 2021-11-22 ENCOUNTER — Ambulatory Visit: Payer: Medicare HMO | Admitting: Internal Medicine

## 2021-11-22 VITALS — BP 128/78 | HR 75 | Temp 97.3°F | Ht 63.5 in | Wt 181.6 lb

## 2021-11-22 DIAGNOSIS — K5900 Constipation, unspecified: Secondary | ICD-10-CM

## 2021-11-22 DIAGNOSIS — K219 Gastro-esophageal reflux disease without esophagitis: Secondary | ICD-10-CM | POA: Diagnosis not present

## 2021-11-22 NOTE — Patient Instructions (Signed)
I am happy to hear that you are doing well.  Continue on pantoprazole daily for your chronic reflux. ? ?Sounds like your bowels are moving well as well.  Continue on over-the-counter stool softener.  Would recommend adding Benefiber as well.  Continue to drink water throughout the day. ? ?Otherwise I think he can follow-up with [place] as needed. ? ?It was nice seeing you again today. ? ?Dr. Marletta Lor ? ?At [facility] we value your feedback. You may receive a survey about your visit today. Please share your experience as we strive to create trusting relationships with our patients to provide genuine, compassionate, quality care. ? ?We appreciate your understanding and patience as we review any laboratory studies, imaging, and other diagnostic tests that are ordered as we care for you. Our office policy is 5 business days for review of these results, and any emergent or urgent results are addressed in a timely manner for your best interest. If you do not hear from our office in 1 week, please contact us.  ? ?We also encourage the use of MyChart, which contains your medical information for your review as well. If you are not enrolled in this feature, an access code is on this after visit summary for your convenience. Thank you for allowing [place] to be involved in your care. ? ?It was great to see you today!  I hope you have a great rest of your Winter! ? ? ? ?Hennie Duos. Marletta Lor, D.O. ?Gastroenterology and Hepatology ?[facility] ? ?

## 2021-11-22 NOTE — Progress Notes (Signed)
? ? ?Referring Provider: Tommie Sams, DO ?Primary Care Physician:  Tommie Sams, DO ?Primary GI:  Dr. Marletta Lor ? ?Chief Complaint  ?Patient presents with  ? Follow-up  ?  No current issues  ? ? ?HPI:   ?Melissa Wiley is a 71 y.o. female who presents to the clinic today for follow-up visit.  We follow her for both constipation and chronic reflux.  For chronic reflux she is currently takes pantoprazole 40 mg daily and states her symptoms are well controlled.  Denies any dysphagia odynophagia.  No epigastric pain.  No melena hematochezia.   ? ?Also has chronic constipation which she took it upon herself to start a stool softener a few months ago.  She takes 3 pills a day of this.  She is unsure what stool softener she takes.  She states since adding this to her regimen that her bowels have become more regular.   ? ?EGD 02/03/2018 with H. pylori gastritis.  Treated with triple therapy and subsequent eradication testing being negative. ? ?Colonoscopy 02/03/2018 unremarkable with no recall given due to age. ? ?Past Medical History:  ?Diagnosis Date  ? Anemia   ? Anxiety   ? Arthritis   ? Grand mal seizure disorder (HCC) 1950's  ? Headache   ? Hx of migraines   ? Hypertension   ? Insomnia   ? Stress   ? ? ?Past Surgical History:  ?Procedure Laterality Date  ? BIOPSY  02/03/2018  ? Procedure: BIOPSY;  Surgeon: West Bali, MD;  Location: [facility];  Service: Endoscopy;;  duodenum,gastric  ? CATARACT EXTRACTION W/PHACO Right 08/05/2020  ? Procedure: CATARACT EXTRACTION PHACO AND INTRAOCULAR LENS PLACEMENT (IOC);  Surgeon: Fabio Pierce, MD;  Location: [facility];  Service: Ophthalmology;  Laterality: Right;  CDE: 13.10  ? CATARACT EXTRACTION W/PHACO Left 08/19/2020  ? Procedure: CATARACT EXTRACTION PHACO AND INTRAOCULAR LENS PLACEMENT (IOC);  Surgeon: Fabio Pierce, MD;  Location: [facility];  Service: Ophthalmology;  Laterality: Left;  CDE  7.16  ? CESAREAN SECTION  1985  ? COLONOSCOPY WITH PROPOFOL N/A 02/03/2018  ?  Procedure: COLONOSCOPY WITH PROPOFOL;  Surgeon: West Bali, MD;  Location: [facility];  Service: Endoscopy;  Laterality: N/A;  7:30am  ? ESOPHAGOGASTRODUODENOSCOPY (EGD) WITH PROPOFOL N/A 02/03/2018  ? Procedure: ESOPHAGOGASTRODUODENOSCOPY (EGD) WITH PROPOFOL;  Surgeon: West Bali, MD;  Location: [facility];  Service: Endoscopy;  Laterality: N/A;  ? ? ?Current Outpatient Medications  ?Medication Sig Dispense Refill  ? ALPRAZolam (XANAX) 0.5 MG tablet TAKE 1/2 TO 1 (ONE-HALF TO ONE) TABLET BY MOUTH DURING THE DAY AS NEEDED FOR EXTREME ANXIETY AND ONE TABLET EVERY  NIGHT AS NEEDED FOR SLEEP 45 tablet 0  ? enalapril (VASOTEC) 20 MG tablet Take 1 tablet by mouth once daily 90 tablet 1  ? hydrochlorothiazide (HYDRODIURIL) 25 MG tablet Take 1/2 (one-half) tablet by mouth once daily 45 tablet 0  ? Multiple Vitamins-Minerals (MULTIVITAMIN WITH MINERALS) tablet Take 1 tablet by mouth daily.    ? Omega-3 Fatty Acids (FISH OIL) 1000 MG CAPS Take 1,000 mg by mouth daily.    ? pantoprazole (PROTONIX) 40 MG tablet Take 1 tablet by mouth once daily 90 tablet 1  ? PARoxetine (PAXIL) 20 MG tablet TAKE 2 TABLETS BY MOUTH IN THE MORNING 180 tablet 0  ? ?No current facility-administered medications for this visit.  ? ? ?Allergies as of 11/22/2021  ? (No Known Allergies)  ? ? ?Family History  ?Problem Relation Age  of Onset  ? Hypertension Mother   ? Hypertension Sister   ? Diabetes Sister   ? Heart disease Sister   ? Colon cancer Neg Hx   ? Colon polyps Neg Hx   ? ? ?Social History  ? ?Socioeconomic History  ? Marital status: Single  ?  Spouse name: Not on file  ? Number of children: 2  ? Years of education: Not on file  ? Highest education level: Not on file  ?Occupational History  ? Not on file  ?Tobacco Use  ? Smoking status: Never  ? Smokeless tobacco: Never  ?Vaping Use  ? Vaping Use: Never used  ?Substance and Sexual Activity  ? Alcohol use: Never  ? Drug use: Never  ? Sexual activity: Yes  ?Other Topics Concern  ?  Not on file  ?Social History Narrative  ? SPECIAL NEEDS DAUGHTER PASSED IN 2019.  ? 2 living daughters.  ? 6 grandchildren  ? 6 great grandchildren  ? ?Social Determinants of Health  ? ?Financial Resource Strain: Low Risk   ? Difficulty of Paying Living Expenses: Not hard at all  ?Food Insecurity: No Food Insecurity  ? Worried About Programme researcher, broadcasting/film/videounning Out of Food in the Last Year: Never true  ? Ran Out of Food in the Last Year: Never true  ?Transportation Needs: No Transportation Needs  ? Lack of Transportation (Medical): No  ? Lack of Transportation (Non-Medical): No  ?Physical Activity: Sufficiently Active  ? Days of Exercise per Week: 5 days  ? Minutes of Exercise per Session: 30 min  ?Stress: No Stress Concern Present  ? Feeling of Stress : Not at all  ?Social Connections: Moderately Integrated  ? Frequency of Communication with Friends and Family: More than three times a week  ? Frequency of Social Gatherings with Friends and Family: More than three times a week  ? Attends Religious Services: More than 4 times per year  ? Active Member of Clubs or Organizations: Yes  ? Attends BankerClub or Organization Meetings: More than 4 times per year  ? Marital Status: Never married  ? ? ?Subjective: ?Review of Systems  ?Constitutional:  Negative for chills and fever.  ?HENT:  Negative for congestion and hearing loss.   ?Eyes:  Negative for blurred vision and double vision.  ?Respiratory:  Negative for cough and shortness of breath.   ?Cardiovascular:  Negative for chest pain and palpitations.  ?Gastrointestinal:  Positive for constipation and heartburn. Negative for abdominal pain, blood in stool, diarrhea, melena and vomiting.  ?Genitourinary:  Negative for dysuria and urgency.  ?Musculoskeletal:  Negative for joint pain and myalgias.  ?Skin:  Negative for itching and rash.  ?Neurological:  Negative for dizziness and headaches.  ?Psychiatric/Behavioral:  Negative for depression. The patient is not nervous/anxious.   ? ? ?Objective: ?BP  128/78 (BP Location: Right Arm, Patient Position: Sitting, Cuff Size: Normal)   Pulse 75   Temp (!) 97.3 ?F (36.3 ?C) (Temporal)   Ht 5' 3.5" (1.613 m)   Wt 181 lb 9.6 oz (82.4 kg)   SpO2 94%   BMI 31.66 kg/m?  ?Physical Exam ?Constitutional:   ?   Appearance: Normal appearance.  ?HENT:  ?   Head: Normocephalic and atraumatic.  ?Eyes:  ?   Extraocular Movements: Extraocular movements intact.  ?   Conjunctiva/sclera: Conjunctivae normal.  ?Cardiovascular:  ?   Rate and Rhythm: Normal rate and regular rhythm.  ?Pulmonary:  ?   Effort: Pulmonary effort is normal.  ?  Breath sounds: Normal breath sounds.  ?Abdominal:  ?   General: Bowel sounds are normal.  ?   Palpations: Abdomen is soft.  ?Musculoskeletal:     ?   General: No swelling. Normal range of motion.  ?   Cervical back: Normal range of motion and neck supple.  ?Skin: ?   General: Skin is warm and dry.  ?   Coloration: Skin is not jaundiced.  ?Neurological:  ?   General: No focal deficit present.  ?   Mental Status: She is alert and oriented to person, place, and time.  ?Psychiatric:     ?   Mood and Affect: Mood normal.     ?   Behavior: Behavior normal.  ? ? ? ?Assessment: ?*Chronic GERD- well controlled on Protonix ?*Constipation - well controlled on OTC stool softener  ? ?Plan: ?Patient's chronic GERD well-controlled on Protonix daily.  We will continue.  No alarm symptoms today. ? ?Constipation doing better on daily stool softener.  We will continue.  Recommend she add Benefiber as well.  She does note drinking multiple glasses of water daily. ? ?GI symptoms overall seem to be well controlled.  I think she can follow-up as needed. ? ? ?11/22/2021 11:09 AM ? ? ?Disclaimer: This note was dictated with voice recognition software. Similar sounding words can inadvertently be transcribed and may not be corrected upon review. ? ?

## 2022-01-04 ENCOUNTER — Other Ambulatory Visit: Payer: Self-pay | Admitting: Family Medicine

## 2022-02-07 ENCOUNTER — Other Ambulatory Visit: Payer: Self-pay | Admitting: Family Medicine

## 2022-02-27 ENCOUNTER — Other Ambulatory Visit: Payer: Self-pay | Admitting: Family Medicine

## 2022-03-22 ENCOUNTER — Other Ambulatory Visit: Payer: Self-pay | Admitting: Family Medicine

## 2022-04-30 ENCOUNTER — Other Ambulatory Visit: Payer: Self-pay | Admitting: Family Medicine

## 2022-05-01 ENCOUNTER — Other Ambulatory Visit: Payer: Self-pay | Admitting: Family Medicine

## 2022-05-01 DIAGNOSIS — K219 Gastro-esophageal reflux disease without esophagitis: Secondary | ICD-10-CM

## 2022-05-11 ENCOUNTER — Other Ambulatory Visit: Payer: Self-pay | Admitting: Family Medicine

## 2022-05-11 DIAGNOSIS — I10 Essential (primary) hypertension: Secondary | ICD-10-CM

## 2022-05-14 ENCOUNTER — Other Ambulatory Visit: Payer: Self-pay | Admitting: Family Medicine

## 2022-05-22 ENCOUNTER — Other Ambulatory Visit: Payer: Self-pay | Admitting: Family Medicine

## 2022-05-22 ENCOUNTER — Encounter: Payer: Self-pay | Admitting: Family Medicine

## 2022-05-22 ENCOUNTER — Ambulatory Visit (INDEPENDENT_AMBULATORY_CARE_PROVIDER_SITE_OTHER): Payer: Medicare HMO | Admitting: Family Medicine

## 2022-05-22 DIAGNOSIS — H9313 Tinnitus, bilateral: Secondary | ICD-10-CM | POA: Diagnosis not present

## 2022-05-22 DIAGNOSIS — F32A Depression, unspecified: Secondary | ICD-10-CM

## 2022-05-22 DIAGNOSIS — F419 Anxiety disorder, unspecified: Secondary | ICD-10-CM

## 2022-05-22 DIAGNOSIS — R69 Illness, unspecified: Secondary | ICD-10-CM | POA: Diagnosis not present

## 2022-05-22 MED ORDER — BUPROPION HCL ER (XL) 150 MG PO TB24: 150.0000 mg | ORAL_TABLET | Freq: Every day | ORAL | 1 refills | Status: DC

## 2022-05-22 NOTE — Patient Instructions (Signed)
I have added wellbutrin.  No more Qtips.  Follow up in 6 weeks.   Take care  Dr. Lacinda Axon

## 2022-05-23 DIAGNOSIS — H9319 Tinnitus, unspecified ear: Secondary | ICD-10-CM | POA: Insufficient documentation

## 2022-05-23 NOTE — Progress Notes (Signed)
Subjective:  Patient ID: Melissa Wiley, female    DOB: 1950/11/20  Age: 70 y.o. MRN: [id number]  CC: Chief Complaint  Patient presents with   Headache    Pt having headaches, anxiety, stress and ringing in ears. Blood pressure elevated when headache comes on. Not as hungry and she used to be. Felt depressed some.     HPI:  71 year old female with underlying anxiety and depression presents for evaluation of the above.  Patient reports worsening stress and anxiety.  She has ongoing issues with her landlord and also has had interpersonal issues with her daughter and granddaughter.  This has prompted worsening of her depression and anxiety per her report.  GAD-7 score of 6 today and PHQ-9 score of 5 today.  She is currently taking alprazolam and paroxetine.  He is in the process of looking for a new place to live.  Additionally, patient reports that she has had ongoing ringing in her ears recently.  She is also had some headache tickly when her blood pressure seems to be elevated.   Patient Active Problem List   Diagnosis Date Noted   Tinnitus 77/82/4235   History of Helicobacter pylori infection 11/09/2021   Hyperlipidemia 11/09/2021   Osteopenia 11/09/2021   Prediabetes 03/22/2020   GERD (gastroesophageal reflux disease) 02/04/2019   Arthritis of left knee 10/26/2014   Insomnia 10/26/2014   Anxiety and depression 10/26/2014   Essential hypertension, benign 02/03/2014    Social Hx   Social History   Socioeconomic History   Marital status: Single    Spouse name: Not on file   Number of children: 2   Years of education: Not on file   Highest education level: Not on file  Occupational History   Not on file  Tobacco Use   Smoking status: Never   Smokeless tobacco: Never  Vaping Use   Vaping Use: Never used  Substance and Sexual Activity   Alcohol use: Never   Drug use: Never   Sexual activity: Yes  Other Topics Concern   Not on file  Social History Narrative    SPECIAL NEEDS DAUGHTER PASSED IN 2019.   2 living daughters.   6 grandchildren   6 great grandchildren   Social Determinants of Health   Financial Resource Strain: Low Risk  (07/25/2021)   Overall Financial Resource Strain (CARDIA)    Difficulty of Paying Living Expenses: Not hard at all  Food Insecurity: No Food Insecurity (07/25/2021)   Hunger Vital Sign    Worried About Running Out of Food in the Last Year: Never true    Ran Out of Food in the Last Year: Never true  Transportation Needs: No Transportation Needs (07/25/2021)   PRAPARE - Hydrologist (Medical): No    Lack of Transportation (Non-Medical): No  Physical Activity: Sufficiently Active (07/25/2021)   Exercise Vital Sign    Days of Exercise per Week: 5 days    Minutes of Exercise per Session: 30 min  Stress: No Stress Concern Present (07/25/2021)   [place]    Feeling of Stress : Not at all  Social Connections: Moderately Integrated (07/25/2021)   Social Connection and Isolation Panel [NHANES]    Frequency of Communication with Friends and Family: More than three times a week    Frequency of Social Gatherings with Friends and Family: More than three times a week    Attends Religious Services: More than 4 times  per year    Active Member of Clubs or Organizations: Yes    Attends Banker Meetings: More than 4 times per year    Marital Status: Never married    Review of Systems Per HPI  Objective:  BP 130/82   Pulse 67   Temp 97.7 F (36.5 C)   Wt 166 lb 6.4 oz (75.5 kg)   SpO2 100%   BMI 29.01 kg/m      05/22/2022   10:27 AM 11/22/2021   11:06 AM 11/09/2021   10:59 AM  BP/Weight  Systolic BP 130 128 130  Diastolic BP 82 78 84  Wt. (Lbs) 166.4 181.6 179.8  BMI 29.01 kg/m2 31.66 kg/m2 31.85 kg/m2    Physical Exam Vitals and nursing note reviewed.  Constitutional:      General: She is not in  acute distress.    Appearance: Normal appearance. She is not ill-appearing.  HENT:     Head: Normocephalic and atraumatic.     Ears:     Comments: Left ear with foreign body noted.  This was removed with alligator forceps.  Foreign body was tip of a Q-tip. Cardiovascular:     Rate and Rhythm: Normal rate and regular rhythm.  Pulmonary:     Effort: Pulmonary effort is normal.     Breath sounds: Normal breath sounds.  Neurological:     Mental Status: She is alert.  Psychiatric:        Mood and Affect: Mood normal.        Behavior: Behavior normal.     Lab Results  Component Value Date   WBC 7.0 02/16/2021   HGB 11.5 02/16/2021   HCT 37.3 02/16/2021   PLT 311 02/16/2021   GLUCOSE 118 (H) 02/16/2021   CHOL 195 02/16/2021   TRIG 207 (H) 02/16/2021   HDL 44 02/16/2021   LDLCALC 115 (H) 02/16/2021   ALT 10 02/16/2021   AST 10 02/16/2021   NA 142 02/16/2021   K 3.8 02/16/2021   CL 101 02/16/2021   CREATININE 0.73 02/16/2021   BUN 12 02/16/2021   CO2 27 02/16/2021   TSH 1.450 11/12/2019   HGBA1C 5.7 (H) 08/03/2020     Assessment & Plan:   Problem List Items Addressed This Visit       Other   Anxiety and depression    Chronic problem, worsening/exacerbation. Adding Wellbutrin.      Relevant Medications   buPROPion (WELLBUTRIN XL) 150 MG 24 hr tablet   Tinnitus    Continues to persist, will need ENT evaluation.  Patient did have a foreign body in her left ear today which was easily removed.       Meds ordered this encounter  Medications   buPROPion (WELLBUTRIN XL) 150 MG 24 hr tablet    Sig: Take 1 tablet (150 mg total) by mouth daily.    Dispense:  90 tablet    Refill:  1    Follow-up:  Return in about 6 weeks (around 07/03/2022).  Everlene Other DO [facility]

## 2022-05-23 NOTE — Assessment & Plan Note (Signed)
Continues to persist, will need ENT evaluation.  Patient did have a foreign body in her left ear today which was easily removed.

## 2022-05-23 NOTE — Assessment & Plan Note (Signed)
Chronic problem, worsening/exacerbation. Adding Wellbutrin.

## 2022-06-02 ENCOUNTER — Other Ambulatory Visit: Payer: Self-pay

## 2022-06-02 ENCOUNTER — Encounter (HOSPITAL_COMMUNITY): Payer: Self-pay

## 2022-06-02 ENCOUNTER — Emergency Department (HOSPITAL_COMMUNITY)
Admission: EM | Admit: 2022-06-02 | Discharge: 2022-06-02 | Disposition: A | Discharge status: Home / Self Care | Payer: Medicare HMO | Attending: Emergency Medicine | Admitting: Emergency Medicine

## 2022-06-02 ENCOUNTER — Emergency Department (HOSPITAL_COMMUNITY): Payer: Medicare HMO

## 2022-06-02 DIAGNOSIS — Z79899 Other long term (current) drug therapy: Secondary | ICD-10-CM | POA: Insufficient documentation

## 2022-06-02 DIAGNOSIS — I10 Essential (primary) hypertension: Secondary | ICD-10-CM | POA: Diagnosis not present

## 2022-06-02 DIAGNOSIS — G4459 Other complicated headache syndrome: Secondary | ICD-10-CM | POA: Diagnosis not present

## 2022-06-02 DIAGNOSIS — R42 Dizziness and giddiness: Secondary | ICD-10-CM | POA: Diagnosis not present

## 2022-06-02 DIAGNOSIS — R519 Headache, unspecified: Secondary | ICD-10-CM | POA: Diagnosis not present

## 2022-06-02 LAB — CBC WITH DIFFERENTIAL/PLATELET
Abs Immature Granulocytes: 0.04 10*3/uL (ref 0.00–0.07)
Basophils Absolute: 0 10*3/uL (ref 0.0–0.1)
Basophils Relative: 0 %
Eosinophils Absolute: 0 10*3/uL (ref 0.0–0.5)
Eosinophils Relative: 0 %
HCT: 41.5 % (ref 36.0–46.0)
Hemoglobin: 12.4 g/dL (ref 12.0–15.0)
Immature Granulocytes: 0 %
Lymphocytes Relative: 9 %
Lymphs Abs: 1.2 10*3/uL (ref 0.7–4.0)
MCH: 24.8 pg — ABNORMAL LOW (ref 26.0–34.0)
MCHC: 29.9 g/dL — ABNORMAL LOW (ref 30.0–36.0)
MCV: 82.8 fL (ref 80.0–100.0)
Monocytes Absolute: 0.5 10*3/uL (ref 0.1–1.0)
Monocytes Relative: 4 %
Neutro Abs: 10.7 10*3/uL — ABNORMAL HIGH (ref 1.7–7.7)
Neutrophils Relative %: 87 %
Platelets: 227 10*3/uL (ref 150–400)
RBC: 5.01 MIL/uL (ref 3.87–5.11)
RDW: 13.5 % (ref 11.5–15.5)
WBC: 12.4 10*3/uL — ABNORMAL HIGH (ref 4.0–10.5)
nRBC: 0 % (ref 0.0–0.2)

## 2022-06-02 LAB — BASIC METABOLIC PANEL
Anion gap: 3 — ABNORMAL LOW (ref 5–15)
BUN: 9 mg/dL (ref 8–23)
CO2: 31 mmol/L (ref 22–32)
Calcium: 8.9 mg/dL (ref 8.9–10.3)
Chloride: 103 mmol/L (ref 98–111)
Creatinine, Ser: 0.67 mg/dL (ref 0.44–1.00)
GFR, Estimated: >60 mL/min (ref >60)
Glucose, Bld: 123 mg/dL — ABNORMAL HIGH (ref 70–99)
Potassium: 3.3 mmol/L — ABNORMAL LOW (ref 3.5–5.1)
Sodium: 137 mmol/L (ref 135–145)

## 2022-06-02 MED ORDER — DIPHENHYDRAMINE HCL 50 MG/ML IJ SOLN
25.0000 mg | Freq: Once | INTRAMUSCULAR | Status: AC
  Administered 2022-06-02: 25 mg via INTRAVENOUS
  Filled 2022-06-02: qty 1

## 2022-06-02 MED ORDER — HYDROCODONE-ACETAMINOPHEN 5-325 MG PO TABS: 1.0000 | ORAL_TABLET | Freq: Four times a day (QID) | ORAL | 0 refills | Status: DC | PRN

## 2022-06-02 MED ORDER — KETOROLAC TROMETHAMINE 30 MG/ML IJ SOLN
15.0000 mg | Freq: Once | INTRAMUSCULAR | Status: AC
  Administered 2022-06-02: 15 mg via INTRAVENOUS
  Filled 2022-06-02: qty 1

## 2022-06-02 MED ORDER — METOCLOPRAMIDE HCL 5 MG/ML IJ SOLN
10.0000 mg | Freq: Once | INTRAMUSCULAR | Status: AC
  Administered 2022-06-02: 10 mg via INTRAVENOUS
  Filled 2022-06-02: qty 2

## 2022-06-02 NOTE — Discharge Instructions (Signed)
Take a baby aspirin a day and follow-up with your family doctor in 1 to 2 weeks for recheck

## 2022-06-02 NOTE — ED Provider Notes (Signed)
[name] Provider Note   CSN: [id number] Arrival date & time: 06/02/22  4854     History {Add pertinent medical, surgical, social history, OB history to HPI:1} Chief Complaint  Patient presents with   Weakness    Melissa Wiley is a 71 y.o. female.  Patient complains of a headache.  Patient has a history of hypertension.   Weakness      Home Medications Prior to Admission medications   Medication Sig Start Date End Date Taking? Authorizing Provider  HYDROcodone-acetaminophen (NORCO/VICODIN) 5-325 MG tablet Take 1 tablet by mouth every 6 (six) hours as needed. 06/02/22  Yes Milton Ferguson, MD  ALPRAZolam (XANAX) 0.5 MG tablet TAKE 1/2 TO 1 (ONE-HALF TO ONE) TABLET BY MOUTH DURING THE DAY AS NEEDED FOR EXTREME ANXIETY AND 1 TABLET EVERY NIGHT AS NEEDED FOR SLEEP 05/01/22   Cook, [name], DO  buPROPion (WELLBUTRIN XL) 150 MG 24 hr tablet Take 1 tablet (150 mg total) by mouth daily. 05/22/22   Coral Spikes, DO  enalapril (VASOTEC) 20 MG tablet Take 1 tablet by mouth once daily 05/11/22   Coral Spikes, DO  hydrochlorothiazide (HYDRODIURIL) 25 MG tablet Take 1/2 (one-half) tablet by mouth once daily 08/21/21   Nilda Simmer, NP  Multiple Vitamins-Minerals (MULTIVITAMIN WITH MINERALS) tablet Take 1 tablet by mouth daily.    [provider]  Omega-3 Fatty Acids (FISH OIL) 1000 MG CAPS Take 1,000 mg by mouth daily.    [provider]  pantoprazole (PROTONIX) 40 MG tablet Take 1 tablet by mouth once daily 05/01/22   Thersa Salt G, DO  PARoxetine (PAXIL) 20 MG tablet TAKE 2 TABLETS BY MOUTH IN THE MORNING 05/22/22   Coral Spikes, DO      Allergies    Patient has no known allergies.    Review of Systems   Review of Systems  Neurological:  Positive for weakness.    Physical Exam Updated Vital Signs BP 133/75   Pulse 76   Temp 98.5 F (36.9 C) (Oral)   Resp 19   SpO2 94%  Physical Exam  ED Results / Procedures / Treatments    Labs (all labs ordered are listed, but only abnormal results are displayed) Labs Reviewed  CBC WITH DIFFERENTIAL/PLATELET - Abnormal; Notable for the following components:      Result Value   WBC 12.4 (*)    MCH 24.8 (*)    MCHC 29.9 (*)    Neutro Abs 10.7 (*)    All other components within normal limits  BASIC METABOLIC PANEL - Abnormal; Notable for the following components:   Potassium 3.3 (*)    Glucose, Bld 123 (*)    Anion gap 3 (*)    All other components within normal limits    EKG None  Radiology CT Head Wo Contrast  Result Date: 06/02/2022 CLINICAL DATA:  Dizziness. EXAM: CT HEAD WITHOUT CONTRAST TECHNIQUE: Contiguous axial images were obtained from the base of the skull through the vertex without intravenous contrast. RADIATION DOSE REDUCTION: This exam was performed according to the departmental dose-optimization program which includes automated exposure control, adjustment of the mA and/or kV according to patient size and/or use of iterative reconstruction technique. COMPARISON:  None Available. FINDINGS: Brain: No evidence of acute infarction, hemorrhage, hydrocephalus, extra-axial collection or mass lesion/mass effect. Left frontal lobe encephalomalacia identified compatible with remote infarct. There is mild diffuse low-attenuation within the subcortical and periventricular white matter compatible with chronic microvascular disease. Vascular:  No hyperdense vessel or unexpected calcification. Skull: Normal. Negative for fracture or focal lesion. Sinuses/Orbits: There is asymmetric mucosal thickening involving the left maxillary sinus. Right mastoid air cell effusion. Other: None. IMPRESSION: 1. No acute intracranial abnormalities. 2. Chronic small vessel ischemic disease and brain atrophy. 3. Left frontal lobe encephalomalacia compatible with remote infarct. 4. Left maxillary sinus mucosal thickening. 5. Right mastoid air cell effusion. Electronically Signed   By: Signa Kell M.D.   On: 06/02/2022 09:27    Procedures Procedures  {Document cardiac monitor, telemetry assessment procedure when appropriate:1}  Medications Ordered in ED Medications  ketorolac (TORADOL) 30 MG/ML injection 15 mg (15 mg Intravenous Given 06/02/22 0917)  metoCLOPramide (REGLAN) injection 10 mg (10 mg Intravenous Given 06/02/22 0917)  diphenhydrAMINE (BENADRYL) injection 25 mg (25 mg Intravenous Given 06/02/22 5053)    ED Course/ Medical Decision Making/ A&P                           Medical Decision Making Amount and/or Complexity of Data Reviewed Labs: ordered. Radiology: ordered.  Risk Prescription drug management.   Patient with a migraine headache that was improved by migraine cocktail.  CT scan shows old stroke and she will be started on baby aspirin a day and follow-up with her PCP  {Document critical care time when appropriate:1} {Document review of labs and clinical decision tools ie heart score, Chads2Vasc2 etc:1}  {Document your independent review of radiology images, and any outside records:1} {Document your discussion with family members, caretakers, and with consultants:1} {Document social determinants of health affecting pt's care:1} {Document your decision making why or why not admission, treatments were needed:1} Final Clinical Impression(s) / ED Diagnoses Final diagnoses:  Other complicated headache syndrome    Rx / DC Orders ED Discharge Orders          Ordered    HYDROcodone-acetaminophen (NORCO/VICODIN) 5-325 MG tablet  Every 6 hours PRN        06/02/22 1450

## 2022-06-02 NOTE — ED Notes (Signed)
EDP into room at d/c. Denies sx or complaints, questions or needs. States, "feel better". Declines w/c.

## 2022-06-02 NOTE — ED Notes (Addendum)
Pt alert, NAD, calm, interactive, resps e/u, speaking clearly, states, "feel better", endorses HA remains a little bit, denies visual changes or nausea. Family at [facility].

## 2022-06-02 NOTE — ED Triage Notes (Signed)
Complaining of headache for the past two weeks. States that she has been feeling "shaky" with hypertension and elevated heart rate, nausea and x1 episode of vomiting this am. Pain worse this am.

## 2022-06-05 ENCOUNTER — Ambulatory Visit (INDEPENDENT_AMBULATORY_CARE_PROVIDER_SITE_OTHER): Payer: Medicare HMO | Admitting: Family Medicine

## 2022-06-05 ENCOUNTER — Encounter: Payer: Self-pay | Admitting: Family Medicine

## 2022-06-05 VITALS — BP 138/82 | HR 73 | Temp 98.4°F | Wt 166.6 lb

## 2022-06-05 DIAGNOSIS — R519 Headache, unspecified: Secondary | ICD-10-CM | POA: Insufficient documentation

## 2022-06-05 DIAGNOSIS — R930 Abnormal findings on diagnostic imaging of skull and head, not elsewhere classified: Secondary | ICD-10-CM | POA: Diagnosis not present

## 2022-06-05 DIAGNOSIS — E876 Hypokalemia: Secondary | ICD-10-CM | POA: Diagnosis not present

## 2022-06-05 NOTE — Assessment & Plan Note (Signed)
CT revealed findings concerning for remote infarct.  Advised patient to take daily baby aspirin.

## 2022-06-05 NOTE — Progress Notes (Signed)
Subjective:  Patient ID: Melissa Wiley, female    DOB: 05-26-1951  Age: 71 y.o. MRN: [id number]  CC: Chief Complaint  Patient presents with   Follow-up    Pt went to [facility] ER on 06/02/22. Pt went due to headache-headache began Friday night. Pt took Xanax to help sleep; up and down all night.     HPI:  71 year old female with hypertension, GERD, anxiety and depression, hyperlipidemia, insomnia presents for ER follow-up.  Patient reports a remote history of migraine headache.  Patient states that she has recently been experiencing more headaches and had a quite severe episode which prompted her to go to the ER on 9/30.  Patient had an abnormal CT scan in the ER but there were no acute findings.  She was treated with Toradol, Reglan, and Benadryl.  She states that she did have improvement in her headache.  Patient presents today for follow-up.  No current headache.  She states that overall she is doing well.  CT scan was reviewed and it revealed left frontal lobe encephalomalacia compatible with prior infarct.  Patient was unaware of any prior issues.  Patient is not currently taking any medications for headache.  She is concerned about the CT findings.  No vision changes.  No weakness.  No other complaints at this time.  Patient Active Problem List   Diagnosis Date Noted   Frontal headache 06/05/2022   Abnormal head CT 06/05/2022   Hypokalemia 57/32/2025   History of Helicobacter pylori infection 11/09/2021   Hyperlipidemia 11/09/2021   Osteopenia 11/09/2021   Prediabetes 03/22/2020   GERD (gastroesophageal reflux disease) 02/04/2019   Arthritis of left knee 10/26/2014   Insomnia 10/26/2014   Anxiety and depression 10/26/2014   Essential hypertension, benign 02/03/2014    Social Hx   Social History   Socioeconomic History   Marital status: Single    Spouse name: Not on file   Number of children: 2   Years of education: Not on file   Highest education level: Not on file   Occupational History   Not on file  Tobacco Use   Smoking status: Never   Smokeless tobacco: Never  Vaping Use   Vaping Use: Never used  Substance and Sexual Activity   Alcohol use: Never   Drug use: Never   Sexual activity: Yes  Other Topics Concern   Not on file  Social History Narrative   SPECIAL NEEDS DAUGHTER PASSED IN 2019.   2 living daughters.   6 grandchildren   6 great grandchildren   Social Determinants of Health   Financial Resource Strain: Low Risk  (07/25/2021)   Overall Financial Resource Strain (CARDIA)    Difficulty of Paying Living Expenses: Not hard at all  Food Insecurity: No Food Insecurity (07/25/2021)   Hunger Vital Sign    Worried About Running Out of Food in the Last Year: Never true    Ran Out of Food in the Last Year: Never true  Transportation Needs: No Transportation Needs (07/25/2021)   PRAPARE - Hydrologist (Medical): No    Lack of Transportation (Non-Medical): No  Physical Activity: Sufficiently Active (07/25/2021)   Exercise Vital Sign    Days of Exercise per Week: 5 days    Minutes of Exercise per Session: 30 min  Stress: No Stress Concern Present (07/25/2021)   [place]    Feeling of Stress : Not at all  Social  Connections: Moderately Integrated (07/25/2021)   Social Connection and Isolation Panel [NHANES]    Frequency of Communication with Friends and Family: More than three times a week    Frequency of Social Gatherings with Friends and Family: More than three times a week    Attends Religious Services: More than 4 times per year    Active Member of [employer] or Organizations: Yes    Attends Engineer, structural: More than 4 times per year    Marital Status: Never married    Review of Systems Per HPI  Objective:  BP 138/82   Pulse 73   Temp 98.4 F (36.9 C)   Wt 166 lb 9.6 oz (75.6 kg)   SpO2 99%   BMI 29.05 kg/m       06/05/2022   10:09 AM 06/02/2022    2:50 PM 06/02/2022    2:40 PM  BP/Weight  Systolic BP 138 138 118  Diastolic BP 82 65 51  Wt. (Lbs) 166.6    BMI 29.05 kg/m2      Physical Exam Vitals and nursing note reviewed.  Constitutional:      General: She is not in acute distress.    Appearance: Normal appearance.  HENT:     Head: Normocephalic and atraumatic.  Eyes:     General:        Right eye: No discharge.        Left eye: No discharge.     Conjunctiva/sclera: Conjunctivae normal.  Cardiovascular:     Rate and Rhythm: Normal rate and regular rhythm.  Pulmonary:     Effort: Pulmonary effort is normal.     Breath sounds: Normal breath sounds. No wheezing, rhonchi or rales.  Neurological:     General: No focal deficit present.     Mental Status: She is alert.  Psychiatric:        Mood and Affect: Mood normal.        Behavior: Behavior normal.     Lab Results  Component Value Date   WBC 12.4 (H) 06/02/2022   HGB 12.4 06/02/2022   HCT 41.5 06/02/2022   PLT 227 06/02/2022   GLUCOSE 123 (H) 06/02/2022   CHOL 195 02/16/2021   TRIG 207 (H) 02/16/2021   HDL 44 02/16/2021   LDLCALC 115 (H) 02/16/2021   ALT 10 02/16/2021   AST 10 02/16/2021   NA 137 06/02/2022   K 3.3 (L) 06/02/2022   CL 103 06/02/2022   CREATININE 0.67 06/02/2022   BUN 9 06/02/2022   CO2 31 06/02/2022   TSH 1.450 11/12/2019   HGBA1C 5.7 (H) 08/03/2020     Assessment & Plan:   Problem List Items Addressed This Visit       Other   Abnormal head CT    CT revealed findings concerning for remote infarct.  Advised patient to take daily baby aspirin.      Relevant Orders   Ambulatory referral to Neurology   Frontal headache - Primary    Given patient's age and abnormal CT, I would like her to see a neurologist for further evaluation.  Referral placed.  No pharmacotherapy for headache at this time.  I advised her that she can use Tylenol if desired.      Relevant Orders   Ambulatory  referral to Neurology   Hypokalemia    Patient's recent blood work in the ER revealed hypokalemia.  Rechecking today.      Relevant Orders   Basic Metabolic Panel  Follow-up:  As previously scheduled.  Everlene Other DO [facility]

## 2022-06-05 NOTE — Patient Instructions (Signed)
Daily aspirin 81 mg.  I have place the referral.  Follow up in 3-6 months.  Take care  Dr. Lacinda Axon

## 2022-06-05 NOTE — Assessment & Plan Note (Signed)
Patient's recent blood work in the ER revealed hypokalemia.  Rechecking today.

## 2022-06-05 NOTE — Assessment & Plan Note (Signed)
Given patient's age and abnormal CT, I would like her to see a neurologist for further evaluation.  Referral placed.  No pharmacotherapy for headache at this time.  I advised her that she can use Tylenol if desired.

## 2022-06-06 LAB — BASIC METABOLIC PANEL
BUN/Creatinine Ratio: 22 (ref 12–28)
BUN: 14 mg/dL (ref 8–27)
CO2: 26 mmol/L (ref 20–29)
Calcium: 9.4 mg/dL (ref 8.7–10.3)
Chloride: 102 mmol/L (ref 96–106)
Creatinine, Ser: 0.65 mg/dL (ref 0.57–1.00)
Glucose: 78 mg/dL (ref 70–99)
Potassium: 3.8 mmol/L (ref 3.5–5.2)
Sodium: 143 mmol/L (ref 134–144)
eGFR: 93 mL/min/{1.73_m2} (ref >59)

## 2022-06-07 ENCOUNTER — Telehealth: Payer: Self-pay | Admitting: *Deleted

## 2022-06-07 NOTE — Telephone Encounter (Signed)
        Patient  visited [facility] ed on 06/02/2022  for heanache   Telephone encounter attempt :  1st  A HIPAA compliant voice message was left requesting a return call.  Instructed patient to call back at [phone].  [facility] [phone] [address] , [place] [address] Email : [email or web address]

## 2022-06-08 ENCOUNTER — Telehealth: Payer: Self-pay | Admitting: *Deleted

## 2022-06-08 NOTE — Telephone Encounter (Signed)
     Patient  visit on 06/02/2022  at [facility]  was for Treatment   Have you been able to follow up with your primary care physician?  The patient was  able to obtain any needed medicine or equipment.  Are there diet recommendations that you are having difficulty following?  Patient expresses understanding of discharge instructions and education provided has no other needs at this time.   [place] [phone] [address] , [place] [address] Email : [email or web address]

## 2022-06-11 NOTE — Progress Notes (Signed)
Patient has been informed per provider result notes and recommendations, pt verbalizes understanding.   °

## 2022-06-12 ENCOUNTER — Encounter: Payer: Self-pay | Admitting: Neurology

## 2022-06-26 ENCOUNTER — Other Ambulatory Visit: Payer: Self-pay | Admitting: Family Medicine

## 2022-07-03 ENCOUNTER — Ambulatory Visit: Payer: Medicare HMO | Admitting: Family Medicine

## 2022-08-10 ENCOUNTER — Ambulatory Visit (INDEPENDENT_AMBULATORY_CARE_PROVIDER_SITE_OTHER): Payer: Medicare HMO

## 2022-08-10 VITALS — BP 128/70 | Ht 63.5 in | Wt 165.4 lb

## 2022-08-10 DIAGNOSIS — Z1231 Encounter for screening mammogram for malignant neoplasm of breast: Secondary | ICD-10-CM

## 2022-08-10 DIAGNOSIS — Z0000 Encounter for general adult medical examination without abnormal findings: Secondary | ICD-10-CM | POA: Diagnosis not present

## 2022-08-10 NOTE — Progress Notes (Signed)
Subjective:   Melissa Wiley is a 71 y.o. female who presents for Medicare Annual (Subsequent) preventive examination.  Review of Systems     Cardiac Risk Factors include: advanced age (>19men, >38 women);hypertension     Objective:    Today's Vitals   08/10/22 1426  BP: 128/70  Weight: 165 lb 6.4 oz (75 kg)  Height: 5' 3.5" (1.613 m)   Body mass index is 28.84 kg/m.     08/10/2022    3:14 PM 06/02/2022    7:44 AM 07/25/2021    2:38 PM 08/19/2020   10:17 AM 08/05/2020    7:11 AM 08/03/2020    9:22 AM 02/03/2018    6:19 AM  Advanced Directives  Does Patient Have a Medical Advance Directive? No No No No No No No  Would patient like information on creating a medical advance directive? No - Patient declined No - Patient declined No - Patient declined  No - Patient declined No - Patient declined No - Patient declined    Current Medications (verified) Outpatient Encounter Medications as of 08/10/2022  Medication Sig   ALPRAZolam (XANAX) 0.5 MG tablet TAKE ONE-HALF TO ONE TABLET BY MOUTH DURING THE DAY AS NEEDED FOR EXTREME ANXIETY AND ONE TABLET EVERY NIGHT AS NEEDED FOR SLEEP   buPROPion (WELLBUTRIN XL) 150 MG 24 hr tablet Take 1 tablet (150 mg total) by mouth daily.   enalapril (VASOTEC) 20 MG tablet Take 1 tablet by mouth once daily   hydrochlorothiazide (HYDRODIURIL) 25 MG tablet Take 1/2 (one-half) tablet by mouth once daily   Multiple Vitamins-Minerals (MULTIVITAMIN WITH MINERALS) tablet Take 1 tablet by mouth daily.   Omega-3 Fatty Acids (FISH OIL) 1000 MG CAPS Take 1,000 mg by mouth daily.   pantoprazole (PROTONIX) 40 MG tablet Take 1 tablet by mouth once daily   PARoxetine (PAXIL) 20 MG tablet TAKE 2 TABLETS BY MOUTH IN THE MORNING   HYDROcodone-acetaminophen (NORCO/VICODIN) 5-325 MG tablet Take 1 tablet by mouth every 6 (six) hours as needed. (Patient not taking: Reported on 08/10/2022)   No facility-administered encounter medications on file as of 08/10/2022.     Allergies (verified) Patient has no known allergies.   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   Headache    Hx of migraines    Hypertension    Insomnia    Stress    Past Surgical History:  Procedure Laterality Date   BIOPSY  02/03/2018   Procedure: BIOPSY;  Surgeon: West Bali, MD;  Location: [facility];  Service: Endoscopy;;  duodenum,gastric   CATARACT EXTRACTION W/PHACO Right 08/05/2020   Procedure: CATARACT EXTRACTION PHACO AND INTRAOCULAR LENS PLACEMENT (IOC);  Surgeon: Fabio Pierce, MD;  Location: [facility];  Service: Ophthalmology;  Laterality: Right;  CDE: 13.10   CATARACT EXTRACTION W/PHACO Left 08/19/2020   Procedure: CATARACT EXTRACTION PHACO AND INTRAOCULAR LENS PLACEMENT (IOC);  Surgeon: Fabio Pierce, MD;  Location: [facility];  Service: Ophthalmology;  Laterality: Left;  CDE  7.16   CESAREAN SECTION  1985   COLONOSCOPY WITH PROPOFOL N/A 02/03/2018   Procedure: COLONOSCOPY WITH PROPOFOL;  Surgeon: West Bali, MD;  [facility]: [facility];  Service: Endoscopy;  Laterality: N/A;  7:30am   ESOPHAGOGASTRODUODENOSCOPY (EGD) WITH PROPOFOL N/A 02/03/2018   Procedure: ESOPHAGOGASTRODUODENOSCOPY (EGD) WITH PROPOFOL;  Surgeon: West Bali, MD;  Location: [facility];  Service: Endoscopy;  Laterality: N/A;   Family History  Problem Relation Age of Onset   Hypertension Mother    Hypertension Sister    Diabetes Sister    Heart disease Sister    Colon cancer Neg Hx    Colon polyps Neg Hx    Social History   Socioeconomic History   Marital status: Single    Spouse name: Not on file   Number of children: 2   Years of education: Not on file   Highest education level: Not on file  Occupational History   Not on file  Tobacco Use   Smoking status: Never   Smokeless tobacco: Never  Vaping Use   Vaping Use: Never used  Substance and Sexual Activity   Alcohol use: Never    Drug use: Never   Sexual activity: Yes  Other Topics Concern   Not on file  Social History Narrative   SPECIAL NEEDS DAUGHTER PASSED IN 2019.   One daughter currently living with her and 1 granddaughter    2 living daughters.   6 grandchildren   6 great grandchildren   Social Determinants of Health   Financial Resource Strain: Low Risk  (08/10/2022)   Overall Financial Resource Strain (CARDIA)    Difficulty of Paying Living Expenses: Not hard at all  Food Insecurity: No Food Insecurity (08/10/2022)   Hunger Vital Sign    Worried About Running Out of Food in the Last Year: Never true    Ran Out of Food in the Last Year: Never true  Transportation Needs: No Transportation Needs (08/10/2022)   PRAPARE - Administrator, Civil Service (Medical): No    Lack of Transportation (Non-Medical): No  Physical Activity: Sufficiently Active (08/10/2022)   Exercise Vital Sign    Days of Exercise per Week: 4 days    Minutes of Exercise per Session: 60 min  Stress: Stress Concern Present (08/10/2022)   [employer] of Occupational Health - Occupational Stress Questionnaire    Feeling of Stress : Very much  Social Connections: Moderately Integrated (08/10/2022)   Social Connection and Isolation Panel [NHANES]    Frequency of Communication with Friends and Family: More than three times a week    Frequency of Social Gatherings with Friends and Family: Three times a week    Attends Religious Services: More than 4 times per year    Active Member of Clubs or Organizations: Yes    Attends Engineer, structural: More than 4 times per year    Marital Status: Never married    Tobacco Counseling Counseling given: Not Answered   Clinical Intake:  Pre-visit preparation completed: Yes  Pain : No/denies pain     Diabetes: No  How often do you need to have someone help you when you read instructions, pamphlets, or other written materials from your doctor or pharmacy?: 1 -  Never  Diabetic?No  Interpreter Needed?: No  Information entered by :: Kandis Fantasia LPN   Activities of Daily Living    08/10/2022    3:14 PM  In your present state of health, do you have any difficulty performing the following activities:  Hearing? 0  Vision? 0  Difficulty concentrating or making decisions? 0  Walking or climbing stairs? 0  Dressing or bathing? 0  Doing errands, shopping? 0  Preparing Food and eating ? N  Using the Toilet? N  In the past six months, have you accidently leaked urine? N  Do you have problems with loss of bowel  control? N  Managing your Medications? N  Managing your Finances? N  Housekeeping or managing your Housekeeping? N    Patient Care Team: Tommie Samsook, [name], DO as PCP - General (Family Medicine) Jonelle SidleMcDowell, Samuel G, MD as PCP - Cardiology (Cardiology) West BaliFields, Sandi L, MD (Inactive) as Consulting Physician (Gastroenterology)  Indicate any recent Medical Services you may have received from other than [facility] providers in the past year (date may be approximate).     Assessment:   This is a routine wellness examination for Elease Hashimotoatricia.  Hearing/Vision screen Hearing Screening - Comments:: No concerns   Vision Screening - Comments:: Up to date with routine eye exams with Dr. Charise Killianotter    Dietary issues and exercise activities discussed: Current Exercise Habits: The patient does not participate in regular exercise at present   Goals Addressed             This Visit's Progress    Exercise 3x per week (30 min per time)   Not on track    Pt states she would like to exercise more.       Depression Screen    08/10/2022    3:13 PM 07/25/2021    2:33 PM 02/10/2021    1:48 PM 07/08/2020    6:52 PM 03/22/2020   10:52 AM 09/29/2019    8:53 AM 10/28/2018   11:02 AM  PHQ 2/9 Scores  PHQ - 2 Score 3 0 0 0 1 3 1   PHQ- 9 Score 6  1 0 4 9 3     Fall Risk    08/10/2022    3:11 PM 07/25/2021    2:39 PM 02/10/2021    1:19 PM 03/22/2020   10:10 AM  10/18/2015   10:37 AM  Fall Risk   Falls in the past year? 0 0 0 0 No  Number falls in past yr: 0 0 0    Injury with Fall? 0 0 0    Risk for fall due to : No Fall Risks No Fall Risks     Follow up Falls evaluation completed;Education provided;Falls prevention discussed Falls prevention discussed Falls evaluation completed Falls evaluation completed     FALL RISK PREVENTION PERTAINING TO THE HOME:  Any stairs in or around the home? No  If so, are there any without handrails? No  Home free of loose throw rugs in walkways, pet beds, electrical cords, etc? Yes  Adequate lighting in your home to reduce risk of falls? Yes   ASSISTIVE DEVICES UTILIZED TO PREVENT FALLS:  Life alert? No  Use of a cane, walker or w/c? No  Grab bars in the bathroom? No  Shower chair or bench in shower? No  Elevated toilet seat or a handicapped toilet? No   TIMED UP AND GO:  Was the test performed? Yes .  Length of time to ambulate 10 feet: 5 sec.   Gait steady and fast without use of assistive device  Cognitive Function:        08/10/2022    3:15 PM 07/25/2021    2:43 PM  6CIT Screen  What Year? 0 points 0 points  What month? 0 points 0 points  What time? 0 points 0 points  Count back from 20 0 points 0 points  Months in reverse 0 points 2 points  Repeat phrase 0 points 2 points  Total Score 0 points 4 points    Immunizations Immunization History  Administered Date(s) Administered   Influenza, High Dose Seasonal PF 07/07/2019  Influenza,inj,Quad PF,6+ Mos 05/01/2018, 07/08/2020   Influenza-Unspecified 07/10/2013, 07/13/2014, 06/07/2016, 07/08/2017, 07/07/2019   Moderna Sars-Covid-2 Vaccination 12/23/2019, 01/22/2020   Pneumococcal Conjugate-13 10/22/2016   Pneumococcal Polysaccharide-23 10/18/2015    TDAP status: Due, Education has been provided regarding the importance of this vaccine. Advised may receive this vaccine at local pharmacy or Health Dept. Aware to provide a copy of the  vaccination record if obtained from local pharmacy or Health Dept. Verbalized acceptance and understanding.  Flu Vaccine status: Up to date  Pneumococcal vaccine status: Up to date  Covid-19 vaccine status: Information provided on how to obtain vaccines.   Qualifies for Shingles Vaccine? Yes   Zostavax completed No   Shingrix Completed?: Yes  Screening Tests Health Maintenance  Topic Date Due   Hepatitis C Screening  Never done   DTaP/Tdap/Td (1 - Tdap) Never done   Zoster Vaccines- Shingrix (1 of 2) Never done   COVID-19 Vaccine (3 - Moderna risk series) 02/19/2020   INFLUENZA VACCINE  04/03/2022   MAMMOGRAM  11/22/2022   Medicare Annual Wellness (AWV)  08/11/2023   COLONOSCOPY (Pts 45-50yrs Insurance coverage will need to be confirmed)  02/04/2028   Pneumonia Vaccine 63+ Years old  Completed   DEXA SCAN  Completed   HPV VACCINES  Aged Out    Health Maintenance  Health Maintenance Due  Topic Date Due   Hepatitis C Screening  Never done   DTaP/Tdap/Td (1 - Tdap) Never done   Zoster Vaccines- Shingrix (1 of 2) Never done   COVID-19 Vaccine (3 - Moderna risk series) 02/19/2020   INFLUENZA VACCINE  04/03/2022    Colorectal cancer screening: Type of screening: Colonoscopy. Completed 02/03/18. Repeat every 10 years  Mammogram status: Ordered today. Pt provided with contact info and advised to call to schedule appt.   Bone Density status: Completed 05/12/20. Results reflect: Bone density results: OSTEOPOROSIS. Repeat every 2 years.  Lung Cancer Screening: (Low Dose CT Chest recommended if Age 5-80 years, 30 pack-year currently smoking OR have quit w/in 15years.) does not qualify.   Lung Cancer Screening Referral: n/a  Additional Screening:  Hepatitis C Screening: does qualify; Completed at next office visit   Vision Screening: Recommended annual ophthalmology exams for early detection of glaucoma and other disorders of the eye. Is the patient up to date with their annual  eye exam?  Yes  Who is the provider or what is the name of the office in which the patient attends annual eye exams? Dr. Charise Killian If pt is not established with a provider, would they like to be referred to a provider to establish care? No .   Dental Screening: Recommended annual dental exams for proper oral hygiene  Community Resource Referral / Chronic Care Management: CRR required this visit?  No   CCM required this visit?  No      Plan:     I have personally reviewed and noted the following in the patient's chart:   Medical and social history Use of alcohol, tobacco or illicit drugs  Current medications and supplements including opioid prescriptions. Patient is not currently taking opioid prescriptions. Functional ability and status Nutritional status Physical activity Advanced directives List of other physicians Hospitalizations, surgeries, and ER visits in previous 12 months Vitals Screenings to include cognitive, depression, and falls Referrals and appointments  In addition, I have reviewed and discussed with patient certain preventive protocols, quality metrics, and best practice recommendations. A written personalized care plan for preventive services as well as general preventive  health recommendations were provided to patient.     Kandis Fantasia [place], [place]   21/10/2480   Nurse Notes: Patient under increased stress with family issues.  She also recently hurt arm moving boxes.  Will call if she needs to be seen.

## 2022-08-10 NOTE — Patient Instructions (Signed)
Melissa Wiley , Thank you for taking time to come for your Medicare Wellness Visit. I appreciate your ongoing commitment to your health goals. Please review the following plan we discussed and let me know if I can assist you in the future.   These are the goals we discussed:  Goals      Exercise 3x per week (30 min per time)     Pt states she would like to exercise more.         This is a list of the screening recommended for you and due dates:  Health Maintenance  Topic Date Due   Hepatitis C Screening: USPSTF Recommendation to screen - Ages 53-79 yo.  Never done   DTaP/Tdap/Td vaccine (1 - Tdap) Never done   Zoster (Shingles) Vaccine (1 of 2) Never done   COVID-19 Vaccine (3 - Moderna risk series) 02/19/2020   Flu Shot  04/03/2022   Mammogram  11/22/2022   Medicare Annual Wellness Visit  08/11/2023   Colon Cancer Screening  02/04/2028   Pneumonia Vaccine  Completed   DEXA scan (bone density measurement)  Completed   HPV Vaccine  Aged Out    Advanced directives: Please bring a copy of your health care power of attorney and living will to the office to be added to your chart at your convenience.   Conditions/risks identified: Aim for 30 minutes of exercise or brisk walking, 6-8 glasses of water, and 5 servings of fruits and vegetables each day.   Next appointment: Follow up in one year for your annual wellness visit.   Preventive Care 11 Years and Older, Female  Preventive care refers to lifestyle choices and visits with your health care provider that can promote health and wellness. What does preventive care include? A yearly physical exam. This is also called an annual well check. Dental exams once or twice a year. Routine eye exams. Ask your health care provider how often you should have your eyes checked. Personal lifestyle choices, including: Daily care of your teeth and gums. Regular physical activity. Eating a healthy diet. Avoiding tobacco and drug use. Limiting alcohol  use. Practicing safe sex. Taking low doses of aspirin every day. Taking vitamin and mineral supplements as recommended by your health care provider. What happens during an annual well check? The services and screenings done by your health care provider during your annual well check will depend on your age, overall health, lifestyle risk factors, and family history of disease. Counseling  Your health care provider may ask you questions about your: Alcohol use. Tobacco use. Drug use. Emotional well-being. Home and relationship well-being. Sexual activity. Eating habits. History of falls. Memory and ability to understand (cognition). Work and work Astronomer. Screening  You may have the following tests or measurements: Height, weight, and BMI. Blood pressure. Lipid and cholesterol levels. These may be checked every 5 years, or more frequently if you are over 61 years old. Skin check. Lung cancer screening. You may have this screening every year starting at age 76 if you have a 30-pack-year history of smoking and currently smoke or have quit within the past 15 years. Fecal occult blood test (FOBT) of the stool. You may have this test every year starting at age 36. Flexible sigmoidoscopy or colonoscopy. You may have a sigmoidoscopy every 5 years or a colonoscopy every 10 years starting at age 87. Prostate cancer screening. Recommendations will vary depending on your family history and other risks. Hepatitis C blood test. Hepatitis B blood  test. Sexually transmitted disease (STD) testing. Diabetes screening. This is done by checking your blood sugar (glucose) after you have not eaten for a while (fasting). You may have this done every 1-3 years. Abdominal aortic aneurysm (AAA) screening. You may need this if you are a current or former smoker. Osteoporosis. You may be screened starting at age 52 if you are at high risk. Talk with your health care provider about your test results,  treatment options, and if necessary, the need for more tests. Vaccines  Your health care provider may recommend certain vaccines, such as: Influenza vaccine. This is recommended every year. Tetanus, diphtheria, and acellular pertussis (Tdap, Td) vaccine. You may need a Td booster every 10 years. Zoster vaccine. You may need this after age 71. Pneumococcal 13-valent conjugate (PCV13) vaccine. One dose is recommended after age 35. Pneumococcal polysaccharide (PPSV23) vaccine. One dose is recommended after age 36. Talk to your health care provider about which screenings and vaccines you need and how often you need them. This information is not intended to replace advice given to you by your health care provider. Make sure you discuss any questions you have with your health care provider. Document Released: 09/16/2015 Document Revised: 05/09/2016 Document Reviewed: 06/21/2015 Elsevier Interactive Patient Education  2017 [place] Prevention in the Home Falls can cause injuries. They can happen to people of all ages. There are many things you can do to make your home safe and to help prevent falls. What can I do on the outside of my home? Regularly fix the edges of walkways and driveways and fix any cracks. Remove anything that might make you trip as you walk through a door, such as a raised step or threshold. Trim any bushes or trees on the path to your home. Use bright outdoor lighting. Clear any walking paths of anything that might make someone trip, such as rocks or tools. Regularly check to see if handrails are loose or broken. Make sure that both sides of any steps have handrails. Any raised decks and porches should have guardrails on the edges. Have any leaves, snow, or ice cleared regularly. Use sand or salt on walking paths during winter. Clean up any spills in your garage right away. This includes oil or grease spills. What can I do in the bathroom? Use night  lights. Install grab bars by the toilet and in the tub and shower. Do not use towel bars as grab bars. Use non-skid mats or decals in the tub or shower. If you need to sit down in the shower, use a plastic, non-slip stool. Keep the floor dry. Clean up any water that spills on the floor as soon as it happens. Remove soap buildup in the tub or shower regularly. Attach bath mats securely with double-sided non-slip rug tape. Do not have throw rugs and other things on the floor that can make you trip. What can I do in the bedroom? Use night lights. Make sure that you have a light by your bed that is easy to reach. Do not use any sheets or blankets that are too big for your bed. They should not hang down onto the floor. Have a firm chair that has side arms. You can use this for support while you get dressed. Do not have throw rugs and other things on the floor that can make you trip. What can I do in the kitchen? Clean up any spills right away. Avoid walking on wet floors. Keep items that  you use a lot in easy-to-reach places. If you need to reach something above you, use a strong step stool that has a grab bar. Keep electrical cords out of the way. Do not use floor polish or wax that makes floors slippery. If you must use wax, use non-skid floor wax. Do not have throw rugs and other things on the floor that can make you trip. What can I do with my stairs? Do not leave any items on the stairs. Make sure that there are handrails on both sides of the stairs and use them. Fix handrails that are broken or loose. Make sure that handrails are as long as the stairways. Check any carpeting to make sure that it is firmly attached to the stairs. Fix any carpet that is loose or worn. Avoid having throw rugs at the top or bottom of the stairs. If you do have throw rugs, attach them to the floor with carpet tape. Make sure that you have a light switch at the top of the stairs and the bottom of the stairs. If  you do not have them, ask someone to add them for you. What else can I do to help prevent falls? Wear shoes that: Do not have high heels. Have rubber bottoms. Are comfortable and fit you well. Are closed at the toe. Do not wear sandals. If you use a stepladder: Make sure that it is fully opened. Do not climb a closed stepladder. Make sure that both sides of the stepladder are locked into place. Ask someone to hold it for you, if possible. Clearly mark and make sure that you can see: Any grab bars or handrails. First and last steps. Where the edge of each step is. Use tools that help you move around (mobility aids) if they are needed. These include: Canes. Walkers. Scooters. Crutches. Turn on the lights when you go into a dark area. Replace any light bulbs as soon as they burn out. Set up your furniture so you have a clear path. Avoid moving your furniture around. If any of your floors are uneven, fix them. If there are any pets around you, be aware of where they are. Review your medicines with your doctor. Some medicines can make you feel dizzy. This can increase your chance of falling. Ask your doctor what other things that you can do to help prevent falls. This information is not intended to replace advice given to you by your health care provider. Make sure you discuss any questions you have with your health care provider. Document Released: 06/16/2009 Document Revised: 01/26/2016 Document Reviewed: 09/24/2014 Elsevier Interactive Patient Education  2017 [employer].

## 2022-08-20 ENCOUNTER — Other Ambulatory Visit: Payer: Self-pay | Admitting: Family Medicine

## 2022-09-27 ENCOUNTER — Other Ambulatory Visit: Payer: Self-pay | Admitting: Family Medicine

## 2022-09-27 DIAGNOSIS — I10 Essential (primary) hypertension: Secondary | ICD-10-CM

## 2022-10-09 ENCOUNTER — Ambulatory Visit (INDEPENDENT_AMBULATORY_CARE_PROVIDER_SITE_OTHER): Payer: Medicare HMO | Admitting: Family Medicine

## 2022-10-09 VITALS — BP 131/81 | HR 68 | Temp 97.0°F | Ht 63.5 in | Wt 165.0 lb

## 2022-10-09 DIAGNOSIS — G9389 Other specified disorders of brain: Secondary | ICD-10-CM | POA: Insufficient documentation

## 2022-10-09 DIAGNOSIS — M25519 Pain in unspecified shoulder: Secondary | ICD-10-CM | POA: Insufficient documentation

## 2022-10-09 DIAGNOSIS — M25511 Pain in right shoulder: Secondary | ICD-10-CM

## 2022-10-09 MED ORDER — NAPROXEN 500 MG PO TABS: 500.0000 mg | ORAL_TABLET | Freq: Two times a day (BID) | ORAL | 0 refills | Status: DC | PRN

## 2022-10-09 NOTE — Patient Instructions (Signed)
Medication as directed.  If continues to persist, please let me know.  Take care  Dr. Lacinda Axon

## 2022-10-09 NOTE — Progress Notes (Signed)
Subjective:  Patient ID: Melissa Wiley, female    DOB: 1950/09/25  Age: 72 y.o. MRN: [id number]  CC: Chief Complaint  Patient presents with   Hypertension    180/89, dizziness and light headed 166/78 This past Friday    right shoulder pain    Left shoulder also , states bone density loss of mass in left arm  Recent moving and picking up boxes     HPI:  72 year old female presents for evaluation the above.  Patient reports ongoing pain of the right shoulder.  She states it has been present for the past 3 to 4 weeks.  Located posteriorly around the right scapula.  She also notes left anterior shoulder pain.  She states that she has recently been doing a lot of heavy lifting (recent move).  No relieving factors.  Patient also notes that her blood pressure is reasonably elevated.  Blood pressure is well-controlled here today.  No other complaints or concerns at this time.   Patient Active Problem List   Diagnosis Date Noted   Encephalomalacia on imaging study 10/09/2022   Shoulder pain 19/50/9326   History of Helicobacter pylori infection 11/09/2021   Hyperlipidemia 11/09/2021   Osteopenia 11/09/2021   Prediabetes 03/22/2020   GERD (gastroesophageal reflux disease) 02/04/2019   Arthritis of left knee 10/26/2014   Insomnia 10/26/2014   Anxiety and depression 10/26/2014   Essential hypertension, benign 02/03/2014    Social Hx   Social History   Socioeconomic History   Marital status: Single    Spouse name: Not on file   Number of children: 2   Years of education: Not on file   Highest education level: Not on file  Occupational History   Not on file  Tobacco Use   Smoking status: Never   Smokeless tobacco: Never  Vaping Use   Vaping Use: Never used  Substance and Sexual Activity   Alcohol use: Never   Drug use: Never   Sexual activity: Yes  Other Topics Concern   Not on file  Social History Narrative   SPECIAL NEEDS DAUGHTER PASSED IN 2019.   One daughter  currently living with her and 1 granddaughter    2 living daughters.   6 grandchildren   6 great grandchildren   Social Determinants of Health   Financial Resource Strain: Low Risk  (08/10/2022)   Overall Financial Resource Strain (CARDIA)    Difficulty of Paying Living Expenses: Not hard at all  Food Insecurity: No Food Insecurity (08/10/2022)   Hunger Vital Sign    Worried About Running Out of Food in the Last Year: Never true    Ran Out of Food in the Last Year: Never true  Transportation Needs: No Transportation Needs (08/10/2022)   PRAPARE - Hydrologist (Medical): No    Lack of Transportation (Non-Medical): No  Physical Activity: Sufficiently Active (08/10/2022)   Exercise Vital Sign    Days of Exercise per Week: 4 days    Minutes of Exercise per Session: 60 min  Stress: Stress Concern Present (08/10/2022)   [place]    Feeling of Stress : Very much  Social Connections: Moderately Integrated (08/10/2022)   Social Connection and Isolation Panel [NHANES]    Frequency of Communication with Friends and Family: More than three times a week    Frequency of Social Gatherings with Friends and Family: Three times a week    Attends Religious Services: More  than 4 times per year    Active Member of Clubs or Organizations: Yes    Attends Archivist Meetings: More than 4 times per year    Marital Status: Never married    Review of Systems Per HPI  Objective:  BP 131/81   Pulse 68   Temp (!) 97 F (36.1 C)   Ht 5' 3.5" (1.613 m)   Wt 165 lb (74.8 kg)   SpO2 99%   BMI 28.77 kg/m      10/09/2022    9:14 AM 08/10/2022    2:26 PM 06/05/2022   10:09 AM  BP/Weight  Systolic BP 981 191 478  Diastolic BP 81 70 82  Wt. (Lbs) 165 165.4 166.6  BMI 28.77 kg/m2 28.84 kg/m2 29.05 kg/m2    Physical Exam Constitutional:      General: She is not in acute distress.    Appearance:  Normal appearance.  HENT:     Head: Normocephalic and atraumatic.  Cardiovascular:     Rate and Rhythm: Normal rate and regular rhythm.  Pulmonary:     Effort: Pulmonary effort is normal.     Breath sounds: No wheezing, rhonchi or rales.  Musculoskeletal:     Comments: Right shoulder with normal rotator cuff strength.  Tenderness around the right scapula.  Left shoulder with tenderness anteriorly over the bicipital groove.  Normal rotator cuff strength.  Neurological:     Mental Status: She is alert.     Lab Results  Component Value Date   WBC 12.4 (H) 06/02/2022   HGB 12.4 06/02/2022   HCT 41.5 06/02/2022   PLT 227 06/02/2022   GLUCOSE 78 06/05/2022   CHOL 195 02/16/2021   TRIG 207 (H) 02/16/2021   HDL 44 02/16/2021   LDLCALC 115 (H) 02/16/2021   ALT 10 02/16/2021   AST 10 02/16/2021   NA 143 06/05/2022   K 3.8 06/05/2022   CL 102 06/05/2022   CREATININE 0.65 06/05/2022   BUN 14 06/05/2022   CO2 26 06/05/2022   TSH 1.450 11/12/2019   HGBA1C 5.7 (H) 08/03/2020     Assessment & Plan:   Problem List Items Addressed This Visit       Other   Shoulder pain - Primary    Patient with right periscapular pain.  Naproxen as directed.  This is MSK in origin.  Patient also having tenderness over the left bicipital groove.  Suspect biceps tendinitis.  Rest and naproxen as directed.       Meds ordered this encounter  Medications   naproxen (NAPROSYN) 500 MG tablet    Sig: Take 1 tablet (500 mg total) by mouth 2 (two) times daily as needed for moderate pain or mild pain.    Dispense:  30 tablet    Refill:  [place]

## 2022-10-09 NOTE — Assessment & Plan Note (Signed)
Patient with right periscapular pain.  Naproxen as directed.  This is MSK in origin.  Patient also having tenderness over the left bicipital groove.  Suspect biceps tendinitis.  Rest and naproxen as directed.

## 2022-10-31 NOTE — Progress Notes (Unsigned)
NEUROLOGY CONSULTATION NOTE  TYLLER GRUHLKE MRN: [id number] DOB: 05/02/1951  Referring provider: Thersa Salt, DO Primary care provider: Thersa Salt, DO  Reason for consult:  headache  Assessment/Plan:   Migraine without aura, without status migrainosus, not intractable Vertigo, likely migrainous  Cerebrovascular disease with incidental chronic left frontal lobe infarct Hypertension Migraine likely triggered by increased emotional stress, now improved.  Will check bilateral carotid ultrasound Continue ASA '81mg'$  daily and continue management of stroke risk factors including hypertension, hyperlipidemia and glycemic control Further recommendations pending results.  Otherwise, follow up as needed.   Subjective:  Melissa Wiley is a 72 year old right-handed female with hypertension, hyperlipidemia, insomnia, arthritis, anxiety and history of seizures who presents for headache.  History supplemented by ED and referring provider's note.  She has a history of migraines since young adulthood.  They are infrequent.  She may get one every couple of years but last 2-3 days.  They are usually mild-moderate intensity and hasn't had a severe migraine in years.  They are bi-temporal/frontal pressure and throbbing pain with photophobia and phonophobia but no nausea, vomiting, or visual disturbance.  On 06/02/2022, she went to the ED when she developed a severe one associated with vertigo and blurred vision.  It was so severe, she was unable to sleep.  No slurred speech, facial droop or unilateral numbness or weakness.  Xanax and Tylenol wouldn't help.  CT head personally reviewed revealed atrophy and chronic small vessel ischemic changes with remote left frontal lobe infarct but no acute findings.  She was treated with a migraine cocktail which broke the headache.  She hasn't had one since.  At the time, she was under a lot of stress.  She was having dispute with her landlord and her daughter and  granddaughter had moved in.  Since then, she has retired, moved and her daughter and granddaughter have moved out.    She has no history of stroke.  She already takes ASA '81mg'$  daily and medication to treat hypertension.  PAST MEDICAL HISTORY: Past Medical History:  Diagnosis Date   Anemia    Anxiety    Arthritis    Grand mal seizure disorder ([place]) 1950's   Headache    Hx of migraines    Hypertension    Insomnia    Stress     PAST SURGICAL HISTORY: Past Surgical History:  Procedure Laterality Date   BIOPSY  02/03/2018   Procedure: BIOPSY;  Surgeon: Danie Binder, MD;  Location: [facility];  Service: Endoscopy;;  duodenum,gastric   CATARACT EXTRACTION W/PHACO Right 08/05/2020   Procedure: CATARACT EXTRACTION PHACO AND INTRAOCULAR LENS PLACEMENT ([place]);  Surgeon: Baruch Goldmann, MD;  Location: [facility];  Service: Ophthalmology;  Laterality: Right;  CDE: 13.10   CATARACT EXTRACTION W/PHACO Left 08/19/2020   Procedure: CATARACT EXTRACTION PHACO AND INTRAOCULAR LENS PLACEMENT ([facility]);  Surgeon: Baruch Goldmann, MD;  Location: [facility];  Service: Ophthalmology;  Laterality: Left;  CDE  7.16   CESAREAN SECTION  1985   COLONOSCOPY WITH PROPOFOL N/A 02/03/2018   Procedure: COLONOSCOPY WITH PROPOFOL;  Surgeon: Danie Binder, MD;  Location: [facility];  Service: Endoscopy;  Laterality: N/A;  7:30am   ESOPHAGOGASTRODUODENOSCOPY (EGD) WITH PROPOFOL N/A 02/03/2018   Procedure: ESOPHAGOGASTRODUODENOSCOPY (EGD) WITH PROPOFOL;  Surgeon: Danie Binder, MD;  Location: [facility];  Service: Endoscopy;  Laterality: N/A;    MEDICATIONS: Current Outpatient Medications on File Prior to Visit  Medication Sig Dispense Refill   ALPRAZolam (  XANAX) 0.5 MG tablet TAKE 1/2 TO 1 (ONE-HALF TO ONE) TABLET BY MOUTH ONCE DAILY AS NEEDED FOR EXTREME ANXIETY THEN 1 TABLET AT NIGHT AS NEEDED FOR SLEEP 90 tablet 0   enalapril (VASOTEC) 20 MG tablet Take 1 tablet by mouth once daily 90 tablet 0   hydrochlorothiazide  (HYDRODIURIL) 25 MG tablet Take 1/2 (one-half) tablet by mouth once daily 45 tablet 0   Multiple Vitamins-Minerals (MULTIVITAMIN WITH MINERALS) tablet Take 1 tablet by mouth daily.     naproxen (NAPROSYN) 500 MG tablet Take 1 tablet (500 mg total) by mouth 2 (two) times daily as needed for moderate pain or mild pain. 30 tablet 0   Omega-3 Fatty Acids (FISH OIL) 1000 MG CAPS Take 1,000 mg by mouth daily.     pantoprazole (PROTONIX) 40 MG tablet Take 1 tablet by mouth once daily 90 tablet 0   PARoxetine (PAXIL) 20 MG tablet TAKE 2 TABLETS BY MOUTH IN THE MORNING 180 tablet 0   No current facility-administered medications on file prior to visit.    ALLERGIES: No Known Allergies  FAMILY HISTORY: Family History  Problem Relation Age of Onset   Hypertension Mother    Hypertension Sister    Diabetes Sister    Heart disease Sister    Colon cancer Neg Hx    Colon polyps Neg Hx     Objective:  Blood pressure (!) 152/72, pulse 73, height '5\' 3"'$  (1.6 m), weight 170 lb (77.1 kg), SpO2 97 %. General: No acute distress.  Patient appears well-groomed.   Head:  Normocephalic/atraumatic Eyes:  fundi examined but not visualized Neck: supple, no paraspinal tenderness, full range of motion Back: No paraspinal tenderness Heart: regular rate and rhythm Lungs: Clear to auscultation bilaterally. Vascular: No carotid bruits. Neurological Exam: Mental status: alert and oriented to person, place, and time, speech fluent and not dysarthric, language intact. Cranial nerves: CN I: not tested CN II: pupils equal, round and reactive to light, visual fields intact CN III, IV, VI:  full range of motion, no nystagmus, no ptosis CN V: facial sensation intact. CN VII: upper and lower face symmetric CN VIII: hearing intact CN IX, X: gag intact, uvula midline CN XI: sternocleidomastoid and trapezius muscles intact CN XII: tongue midline Bulk & Tone: normal, no fasciculations. Motor:  muscle strength 5/5  throughout Sensation:  Pinprick, temperature and vibratory sensation intact. Deep Tendon Reflexes:  2+ throughout,  toes downgoing.   Finger to nose testing:  Without dysmetria.   Heel to shin:  Without dysmetria.   Gait:  Normal station and stride.  Romberg negative.    Thank you for allowing me to take part in the care of this patient.  Metta Clines, DO  CC: Thersa Salt, DO

## 2022-11-01 ENCOUNTER — Ambulatory Visit: Payer: Medicare HMO | Admitting: Neurology

## 2022-11-01 ENCOUNTER — Encounter: Payer: Self-pay | Admitting: Neurology

## 2022-11-01 VITALS — BP 152/72 | HR 73 | Ht 63.0 in | Wt 170.0 lb

## 2022-11-01 DIAGNOSIS — G43109 Migraine with aura, not intractable, without status migrainosus: Secondary | ICD-10-CM

## 2022-11-01 DIAGNOSIS — I679 Cerebrovascular disease, unspecified: Secondary | ICD-10-CM | POA: Diagnosis not present

## 2022-11-01 DIAGNOSIS — R42 Dizziness and giddiness: Secondary | ICD-10-CM

## 2022-11-01 NOTE — Patient Instructions (Signed)
I think you had a bad migraine triggered by all of the stress.  The old stroke on the brain scan is an incidental finding.  I would like to check an ultrasound of your carotid ultrasound to look at the arteries in the neck.

## 2022-11-15 ENCOUNTER — Ambulatory Visit
Admission: RE | Admit: 2022-11-15 | Discharge: 2022-11-15 | Disposition: A | Discharge status: Home / Self Care | Payer: Medicare HMO | Source: Ambulatory Visit | Attending: Neurology | Admitting: Neurology

## 2022-11-15 DIAGNOSIS — E041 Nontoxic single thyroid nodule: Secondary | ICD-10-CM | POA: Diagnosis not present

## 2022-11-15 DIAGNOSIS — I6523 Occlusion and stenosis of bilateral carotid arteries: Secondary | ICD-10-CM | POA: Diagnosis not present

## 2022-11-15 DIAGNOSIS — I679 Cerebrovascular disease, unspecified: Secondary | ICD-10-CM

## 2022-11-16 ENCOUNTER — Other Ambulatory Visit: Payer: Self-pay | Admitting: Family Medicine

## 2022-11-16 ENCOUNTER — Telehealth: Payer: Self-pay | Admitting: Family Medicine

## 2022-11-16 DIAGNOSIS — E041 Nontoxic single thyroid nodule: Secondary | ICD-10-CM

## 2022-11-16 NOTE — Telephone Encounter (Signed)
Patient called this morning stating she had an carotid ultrasound done yesterday and that they found a nodule she wants to know if she needs to be seen before 12/11/2022.  CB# [phone]

## 2022-11-16 NOTE — Progress Notes (Signed)
Patient advised of her Carotid U/S results.

## 2022-11-16 NOTE — Telephone Encounter (Signed)
Coral Spikes, DO     Needs dedicated thyroid US. I have ordered it. Please schedule it and I will have her follow up afterwards.

## 2022-11-17 ENCOUNTER — Other Ambulatory Visit: Payer: Self-pay | Admitting: Family Medicine

## 2022-11-20 NOTE — Telephone Encounter (Signed)
Coral Spikes, DO   Needs dedicated thyroid US. I have ordered it. Please schedule it and I will have her follow up afterwards.

## 2022-11-21 NOTE — Telephone Encounter (Signed)
11/27/2022  3:30 PM at [place]

## 2022-11-21 NOTE — Telephone Encounter (Signed)
11/27/2022  3:30 PM at [place]

## 2022-11-27 ENCOUNTER — Ambulatory Visit (HOSPITAL_COMMUNITY)
Admission: RE | Admit: 2022-11-27 | Discharge: 2022-11-27 | Disposition: A | Discharge status: Home / Self Care | Payer: Medicare HMO | Source: Ambulatory Visit | Attending: Family Medicine | Admitting: Family Medicine

## 2022-11-27 DIAGNOSIS — E041 Nontoxic single thyroid nodule: Secondary | ICD-10-CM | POA: Insufficient documentation

## 2022-11-29 ENCOUNTER — Other Ambulatory Visit: Payer: Self-pay | Admitting: Family Medicine

## 2022-11-29 DIAGNOSIS — E041 Nontoxic single thyroid nodule: Secondary | ICD-10-CM

## 2022-12-05 ENCOUNTER — Ambulatory Visit: Payer: Medicare HMO | Admitting: Family Medicine

## 2022-12-06 ENCOUNTER — Other Ambulatory Visit: Payer: Self-pay | Admitting: Family Medicine

## 2022-12-06 ENCOUNTER — Other Ambulatory Visit: Payer: Self-pay | Admitting: *Deleted

## 2022-12-06 DIAGNOSIS — E041 Nontoxic single thyroid nodule: Secondary | ICD-10-CM

## 2022-12-10 ENCOUNTER — Other Ambulatory Visit: Payer: Self-pay | Admitting: Family Medicine

## 2022-12-10 DIAGNOSIS — E041 Nontoxic single thyroid nodule: Secondary | ICD-10-CM

## 2022-12-11 ENCOUNTER — Ambulatory Visit (INDEPENDENT_AMBULATORY_CARE_PROVIDER_SITE_OTHER): Payer: Medicare HMO | Admitting: Family Medicine

## 2022-12-11 VITALS — BP 133/77 | HR 77 | Temp 97.5°F | Ht 63.0 in | Wt 174.0 lb

## 2022-12-11 DIAGNOSIS — E042 Nontoxic multinodular goiter: Secondary | ICD-10-CM | POA: Diagnosis not present

## 2022-12-11 DIAGNOSIS — R69 Illness, unspecified: Secondary | ICD-10-CM | POA: Diagnosis not present

## 2022-12-11 DIAGNOSIS — F419 Anxiety disorder, unspecified: Secondary | ICD-10-CM | POA: Diagnosis not present

## 2022-12-11 DIAGNOSIS — F32A Depression, unspecified: Secondary | ICD-10-CM | POA: Diagnosis not present

## 2022-12-11 DIAGNOSIS — I10 Essential (primary) hypertension: Secondary | ICD-10-CM

## 2022-12-11 NOTE — Assessment & Plan Note (Signed)
Stable.  Continue enalapril and HCTZ. ?

## 2022-12-11 NOTE — Patient Instructions (Signed)
Continue your medications.    Follow up in 6 months

## 2022-12-11 NOTE — Progress Notes (Signed)
Subjective:  Patient ID: Melissa Wiley, female    DOB: 07/27/1951  Age: 72 y.o. MRN: [id number]  CC: Chief Complaint  Patient presents with   6 month follow up     HPI:  72 year old female presents for follow-up.  Patient recently had ultrasound which revealed multinodular thyroid.  She is scheduled for biopsy in the near future.  Blood pressure is well-controlled on enalapril and HCTZ.  Her anxiety and depression is stable on Paxil.  She states that she is doing fairly well at this time.  No other complaints or concerns at this time.  Patient Active Problem List   Diagnosis Date Noted   Multinodular thyroid 12/11/2022   Encephalomalacia on imaging study 10/09/2022   History of Helicobacter pylori infection 11/09/2021   Hyperlipidemia 11/09/2021   Osteopenia 11/09/2021   Prediabetes 03/22/2020   GERD (gastroesophageal reflux disease) 02/04/2019   Arthritis of left knee 10/26/2014   Insomnia 10/26/2014   Anxiety and depression 10/26/2014   Essential hypertension, benign 02/03/2014    Social Hx   Social History   Socioeconomic History   Marital status: Single    Spouse name: Not on file   Number of children: 2   Years of education: Not on file   Highest education level: Not on file  Occupational History   Not on file  Tobacco Use   Smoking status: Never   Smokeless tobacco: Never  Vaping Use   Vaping Use: Never used  Substance and Sexual Activity   Alcohol use: Never   Drug use: Never   Sexual activity: Yes  Other Topics Concern   Not on file  Social History Narrative   SPECIAL NEEDS DAUGHTER PASSED IN 2019.   One daughter currently living with her and 1 granddaughter    2 living daughters.   6 grandchildren   6 great grandchildren   Social Determinants of Health   Financial Resource Strain: Low Risk  (08/10/2022)   Overall Financial Resource Strain (CARDIA)    Difficulty of Paying Living Expenses: Not hard at all  Food Insecurity: No Food  Insecurity (08/10/2022)   Hunger Vital Sign    Worried About Running Out of Food in the Last Year: Never true    Ran Out of Food in the Last Year: Never true  Transportation Needs: No Transportation Needs (08/10/2022)   PRAPARE - Administrator, Civil Service (Medical): No    Lack of Transportation (Non-Medical): No  Physical Activity: Sufficiently Active (08/10/2022)   Exercise Vital Sign    Days of Exercise per Week: 4 days    Minutes of Exercise per Session: 60 min  Stress: Stress Concern Present (08/10/2022)   [employer] of Occupational Health - Occupational Stress Questionnaire    Feeling of Stress : Very much  Social Connections: Moderately Integrated (08/10/2022)   Social Connection and Isolation Panel [NHANES]    Frequency of Communication with Friends and Family: More than three times a week    Frequency of Social Gatherings with Friends and Family: Three times a week    Attends Religious Services: More than 4 times per year    Active Member of Clubs or Organizations: Yes    Attends Banker Meetings: More than 4 times per year    Marital Status: Never married    Review of Systems Per HPI  Objective:  BP 133/77   Pulse 77   Temp (!) 97.5 F (36.4 C)   Ht 5\' 3"  (1.6 m)  Wt 174 lb (78.9 kg)   SpO2 98%   BMI 30.82 kg/m      12/11/2022    9:50 AM 11/01/2022   11:53 AM 11/01/2022   11:51 AM  BP/Weight  Systolic BP 133 152 154  Diastolic BP 77 72 80  Wt. (Lbs) 174  170  BMI 30.82 kg/m2  30.11 kg/m2    Physical Exam Vitals and nursing note reviewed.  Constitutional:      General: She is not in acute distress.    Appearance: Normal appearance.  HENT:     Head: Normocephalic and atraumatic.  Cardiovascular:     Rate and Rhythm: Normal rate and regular rhythm.  Pulmonary:     Effort: Pulmonary effort is normal.     Breath sounds: Normal breath sounds. No wheezing, rhonchi or rales.  Neurological:     Mental Status: She is alert.   Psychiatric:        Mood and Affect: Mood normal.        Behavior: Behavior normal.     Lab Results  Component Value Date   WBC 12.4 (H) 06/02/2022   HGB 12.4 06/02/2022   HCT 41.5 06/02/2022   PLT 227 06/02/2022   GLUCOSE 78 06/05/2022   CHOL 195 02/16/2021   TRIG 207 (H) 02/16/2021   HDL 44 02/16/2021   LDLCALC 115 (H) 02/16/2021   ALT 10 02/16/2021   AST 10 02/16/2021   NA 143 06/05/2022   K 3.8 06/05/2022   CL 102 06/05/2022   CREATININE 0.65 06/05/2022   BUN 14 06/05/2022   CO2 26 06/05/2022   TSH 1.450 11/12/2019   HGBA1C 5.7 (H) 08/03/2020     Assessment & Plan:   Problem List Items Addressed This Visit       Cardiovascular and Mediastinum   Essential hypertension, benign - Primary    Stable.  Continue enalapril and HCTZ.        Endocrine   Multinodular thyroid    Upcoming biopsy.  Will await results.        Other   Anxiety and depression    Stable on Paxil.  Continue.      Follow-up:  Return in about 6 months (around 06/12/2023).  Everlene Other DO [facility]

## 2022-12-11 NOTE — Assessment & Plan Note (Signed)
Stable on Paxil.  Continue. °

## 2022-12-11 NOTE — Assessment & Plan Note (Signed)
Upcoming biopsy.  Will await results.

## 2022-12-13 ENCOUNTER — Encounter (HOSPITAL_COMMUNITY): Payer: Self-pay

## 2022-12-13 ENCOUNTER — Ambulatory Visit (HOSPITAL_COMMUNITY)
Admission: RE | Admit: 2022-12-13 | Discharge: 2022-12-13 | Disposition: A | Discharge status: Home / Self Care | Payer: Medicare HMO | Source: Ambulatory Visit | Attending: Family Medicine | Admitting: Family Medicine

## 2022-12-13 ENCOUNTER — Other Ambulatory Visit: Payer: Self-pay | Admitting: Radiology

## 2022-12-13 DIAGNOSIS — E042 Nontoxic multinodular goiter: Secondary | ICD-10-CM | POA: Diagnosis not present

## 2022-12-13 DIAGNOSIS — R896 Abnormal cytological findings in specimens from other organs, systems and tissues: Secondary | ICD-10-CM | POA: Diagnosis not present

## 2022-12-13 DIAGNOSIS — E041 Nontoxic single thyroid nodule: Secondary | ICD-10-CM

## 2022-12-13 MED ORDER — LIDOCAINE HCL (PF) 2 % IJ SOLN
INTRAMUSCULAR | Status: AC
  Filled 2022-12-13: qty 30

## 2022-12-13 MED ORDER — LIDOCAINE HCL (PF) 2 % IJ SOLN
20.0000 mL | Freq: Once | INTRAMUSCULAR | Status: AC
  Administered 2022-12-13: 20 mL

## 2022-12-13 NOTE — Progress Notes (Signed)
PT tolerated double thyroid biopsy procedure well today. Labs and afirma obtained and sent for pathology by Richard from ultrasound at 1127. PT ambulatory at discharge with no acute distress noted and verbalized understanding of discharge instructions.

## 2022-12-14 LAB — CYTOLOGY - NON PAP

## 2022-12-18 LAB — CYTOLOGY - NON PAP

## 2022-12-19 ENCOUNTER — Other Ambulatory Visit: Payer: Self-pay | Admitting: Family Medicine

## 2022-12-19 DIAGNOSIS — E042 Nontoxic multinodular goiter: Secondary | ICD-10-CM | POA: Diagnosis not present

## 2022-12-19 DIAGNOSIS — I10 Essential (primary) hypertension: Secondary | ICD-10-CM

## 2022-12-31 ENCOUNTER — Telehealth: Payer: Self-pay | Admitting: Family Medicine

## 2022-12-31 ENCOUNTER — Encounter (HOSPITAL_COMMUNITY): Payer: Self-pay

## 2022-12-31 NOTE — Telephone Encounter (Signed)
Patient advised of results and provider recommendations and verbalized understanding.Patient stated she is on a very fixed income and can not afford to go to specialist and have anything else done currently and would like to hold on referral and will call back if she wants to proceed but states she can not afford further interventions at this time

## 2022-12-31 NOTE — Telephone Encounter (Signed)
Patient would like results of biopsy that was done on 4/11 . She was told at [facility] that her primary doctor could give results.

## 2022-12-31 NOTE — Telephone Encounter (Signed)
Everlene Other G, DO     It showed atypia of undetermined significance. I recommend that she see General Surgery.

## 2023-02-14 ENCOUNTER — Other Ambulatory Visit: Payer: Self-pay | Admitting: Family Medicine

## 2023-02-28 ENCOUNTER — Other Ambulatory Visit: Payer: Self-pay | Admitting: Family Medicine

## 2023-02-28 MED ORDER — ALPRAZOLAM 0.5 MG PO TABS: 0.5000 mg | ORAL_TABLET | Freq: Two times a day (BID) | ORAL | 3 refills | Status: DC | PRN

## 2023-02-28 NOTE — Telephone Encounter (Signed)
Refill on ALPRAZolam (XANAX) 0.5 MG tablet  send to [facility]

## 2023-03-18 ENCOUNTER — Other Ambulatory Visit: Payer: Self-pay | Admitting: Nurse Practitioner

## 2023-03-18 DIAGNOSIS — I10 Essential (primary) hypertension: Secondary | ICD-10-CM

## 2023-04-15 DIAGNOSIS — H43393 Other vitreous opacities, bilateral: Secondary | ICD-10-CM | POA: Diagnosis not present

## 2023-04-22 ENCOUNTER — Ambulatory Visit
Admission: EM | Admit: 2023-04-22 | Discharge: 2023-04-22 | Disposition: A | Discharge status: Home / Self Care | Payer: Medicare HMO | Attending: Family Medicine | Admitting: Family Medicine

## 2023-04-22 DIAGNOSIS — U071 COVID-19: Secondary | ICD-10-CM | POA: Insufficient documentation

## 2023-04-22 DIAGNOSIS — R059 Cough, unspecified: Secondary | ICD-10-CM | POA: Insufficient documentation

## 2023-04-22 DIAGNOSIS — J069 Acute upper respiratory infection, unspecified: Secondary | ICD-10-CM

## 2023-04-22 LAB — POCT RAPID STREP A (OFFICE): Rapid Strep A Screen: NEGATIVE

## 2023-04-22 MED ORDER — LIDOCAINE VISCOUS HCL 2 % MT SOLN: 10.0000 mL | OROMUCOSAL | 0 refills | Status: DC | PRN

## 2023-04-22 MED ORDER — PROMETHAZINE-DM 6.25-15 MG/5ML PO SYRP: 5.0000 mL | ORAL_SOLUTION | Freq: Four times a day (QID) | ORAL | 0 refills | Status: DC | PRN

## 2023-04-22 NOTE — ED Provider Notes (Signed)
[facility]    CSN: [id number] Arrival date & time: 04/22/23  4401      History   Chief Complaint Chief Complaint  Patient presents with   Cough    HPI Melissa Wiley is a 72 y.o. female.   Patient resenting today with 4-day history of sore throat, cough, fatigue, headache.  Denies chest pain, shortness of breath, fever, abdominal pain, nausea vomiting or diarrhea.  Trying Mucinex with minimal relief.  No known history of chronic pulmonary disease.    Past Medical History:  Diagnosis Date   Anemia    Anxiety    Arthritis    Grand mal seizure disorder (HCC) 50's   Headache    Hx of migraines    Hypertension    Insomnia    Stress     Patient Active Problem List   Diagnosis Date Noted   Multinodular thyroid 12/11/2022   Encephalomalacia on imaging study 10/09/2022   History of Helicobacter pylori infection 11/09/2021   Hyperlipidemia 11/09/2021   Osteopenia 11/09/2021   Prediabetes 03/22/2020   GERD (gastroesophageal reflux disease) 02/04/2019   Arthritis of left knee 10/26/2014   Insomnia 10/26/2014   Anxiety and depression 10/26/2014   Essential hypertension, benign 02/03/2014    Past Surgical History:  Procedure Laterality Date   BIOPSY  02/03/2018   Procedure: BIOPSY;  Surgeon: West Bali, MD;  Location: [facility];  Service: Endoscopy;;  duodenum,gastric   CATARACT EXTRACTION W/PHACO Right 08/05/2020   Procedure: CATARACT EXTRACTION PHACO AND INTRAOCULAR LENS PLACEMENT (IOC);  Surgeon: Fabio Pierce, MD;  Location: [facility];  Service: Ophthalmology;  Laterality: Right;  CDE: 13.10   CATARACT EXTRACTION W/PHACO Left 08/19/2020   Procedure: CATARACT EXTRACTION PHACO AND INTRAOCULAR LENS PLACEMENT (IOC);  Surgeon: Fabio Pierce, MD;  Location: [facility];  Service: Ophthalmology;  Laterality: Left;  CDE  7.16   CESAREAN SECTION  1985   COLONOSCOPY WITH PROPOFOL N/A 02/03/2018   Procedure: COLONOSCOPY WITH PROPOFOL;  Surgeon: West Bali, MD;   Location: [facility];  Service: Endoscopy;  Laterality: N/A;  7:30am   ESOPHAGOGASTRODUODENOSCOPY (EGD) WITH PROPOFOL N/A 02/03/2018   Procedure: ESOPHAGOGASTRODUODENOSCOPY (EGD) WITH PROPOFOL;  Surgeon: West Bali, MD;  Location: [facility];  Service: Endoscopy;  Laterality: N/A;    OB History   No obstetric history on file.      Home Medications    Prior to Admission medications   Medication Sig Start Date End Date Taking? Authorizing Provider  ALPRAZolam Prudy Feeler) 0.5 MG tablet Take 1 tablet (0.5 mg total) by mouth 2 (two) times daily as needed for anxiety. 02/28/23  Yes Tommie Sams, DO  aspirin EC 81 MG tablet Take 81 mg by mouth daily. Swallow whole.   Yes [provider]  enalapril (VASOTEC) 20 MG tablet Take 1 tablet by mouth once daily 03/18/23  Yes Campbell Riches, NP  hydrochlorothiazide (HYDRODIURIL) 25 MG tablet Take 1/2 (one-half) tablet by mouth once daily 08/21/21  Yes Sherie Don C, NP  lidocaine (XYLOCAINE) 2 % solution Use as directed 10 mLs in the mouth or throat every 3 (three) hours as needed. 04/22/23  Yes Particia Nearing, PA-C  Multiple Vitamins-Minerals (MULTIVITAMIN WITH MINERALS) tablet Take 1 tablet by mouth daily.   Yes [provider]  naproxen (NAPROSYN) 500 MG tablet Take 1 tablet (500 mg total) by mouth 2 (two) times daily as needed for moderate pain or mild pain. 10/09/22  Yes Tommie Sams, DO  Omega-3 Fatty Acids (FISH OIL) 1000 MG CAPS Take 1,000 mg by mouth daily.   Yes [provider]  pantoprazole (PROTONIX) 40 MG tablet Take 1 tablet by mouth once daily 05/01/22  Yes Cook, [name], DO  PARoxetine (PAXIL) 20 MG tablet TAKE 2 TABLETS BY MOUTH IN THE MORNING 02/17/23  Yes Cook, [name], DO  promethazine-dextromethorphan (PROMETHAZINE-DM) 6.25-15 MG/5ML syrup Take 5 mLs by mouth 4 (four) times daily as needed. 04/22/23  Yes Particia Nearing, PA-C    Family History Family History  Problem Relation Age  of Onset   Hypertension Mother    Hypertension Sister    Diabetes Sister    Heart disease Sister    Migraines Daughter    Migraines Daughter    Colon cancer Neg Hx    Colon polyps Neg Hx     Social History Social History   Tobacco Use   Smoking status: Never   Smokeless tobacco: Never  Vaping Use   Vaping status: Never Used  Substance Use Topics   Alcohol use: Never   Drug use: Never     Allergies   Patient has no known allergies.   Review of Systems Review of Systems Per HPI  Physical Exam Triage Vital Signs ED Triage Vitals  Encounter Vitals Group     BP 04/22/23 1043 129/74     Systolic BP Percentile --      Diastolic BP Percentile --      Pulse Rate 04/22/23 1043 93     Resp 04/22/23 1043 16     Temp 04/22/23 1043 98.8 F (37.1 C)     Temp Source 04/22/23 1043 Oral     SpO2 04/22/23 1043 93 %     Weight --      Height --      Head Circumference --      Peak Flow --      Pain Score 04/22/23 1044 6     Pain Loc --      Pain Education --      Exclude from Growth Chart --    No data found.  Updated Vital Signs BP 129/74 (BP Location: Right Arm)   Pulse 93   Temp 98.8 F (37.1 C) (Oral)   Resp 16   SpO2 93%   Visual Acuity Right Eye Distance:   Left Eye Distance:   Bilateral Distance:    Right Eye Near:   Left Eye Near:    Bilateral Near:     Physical Exam Vitals and nursing note reviewed.  Constitutional:      Appearance: Normal appearance.  HENT:     Head: Atraumatic.     Right Ear: Tympanic membrane and external ear normal.     Left Ear: Tympanic membrane and external ear normal.     Nose: Rhinorrhea present.     Mouth/Throat:     Mouth: Mucous membranes are moist.     Pharynx: Posterior oropharyngeal erythema present.  Eyes:     Extraocular Movements: Extraocular movements intact.     Conjunctiva/sclera: Conjunctivae normal.  Cardiovascular:     Rate and Rhythm: Normal rate and regular rhythm.     Heart sounds: Normal heart  sounds.  Pulmonary:     Effort: Pulmonary effort is normal.     Breath sounds: Normal breath sounds. No wheezing or rales.  Musculoskeletal:        General: Normal range of motion.     Cervical back: Normal range of motion  and neck supple.  Skin:    General: Skin is warm and dry.  Neurological:     Mental Status: She is alert and oriented to person, place, and time.     Motor: No weakness.     Gait: Gait normal.  Psychiatric:        Mood and Affect: Mood normal.        Thought Content: Thought content normal.      UC Treatments / Results  Labs (all labs ordered are listed, but only abnormal results are displayed) Labs Reviewed  SARS CORONAVIRUS 2 (TAT 6-24 HRS)  POCT RAPID STREP A (OFFICE)    EKG   Radiology No results found.  Procedures Procedures (including critical care time)  Medications Ordered in UC Medications - No data to display  Initial Impression / Assessment and Plan / UC Course  I have reviewed the triage vital signs and the nursing notes.  Pertinent labs & imaging results that were available during my care of the patient were reviewed by me and considered in my medical decision making (see chart for details).     Vital signs and exam overall reassuring and suspicious for viral respiratory infection.  Rapid strep negative, COVID testing pending.  Discussed supportive over-the-counter medications, home care, Phenergan DM and viscous lidocaine while awaiting results.  Good candidate for Paxlovid if positive.  Final Clinical Impressions(s) / UC Diagnoses   Final diagnoses:  Viral URI with cough     Discharge Instructions      Your strep test was negative today.  We have tested you for COVID, this should be back in the morning and someone will call if it is positive to discuss antiviral therapy with you.  In the meantime, you may take medications such as Coricidin HBP, plain Mucinex, Flonase in addition to the medications that I have prescribed for  your symptoms.  Make sure to stay well-hydrated and get plenty of rest.    ED Prescriptions     Medication Sig Dispense Auth. Provider   promethazine-dextromethorphan (PROMETHAZINE-DM) 6.25-15 MG/5ML syrup Take 5 mLs by mouth 4 (four) times daily as needed. 100 mL Particia Nearing, PA-C   lidocaine (XYLOCAINE) 2 % solution Use as directed 10 mLs in the mouth or throat every 3 (three) hours as needed. 100 mL Particia Nearing, [place]      PDMP not reviewed this encounter.   Particia Nearing, [place] 04/22/23 1138

## 2023-04-22 NOTE — ED Triage Notes (Addendum)
Sore throat, cough, fatigue, headache, that started 4 days ago. Taking mucinex with little relief.

## 2023-04-22 NOTE — Discharge Instructions (Signed)
Your strep test was negative today.  We have tested you for COVID, this should be back in the morning and someone will call if it is positive to discuss antiviral therapy with you.  In the meantime, you may take medications such as Coricidin HBP, plain Mucinex, Flonase in addition to the medications that I have prescribed for your symptoms.  Make sure to stay well-hydrated and get plenty of rest.

## 2023-04-23 ENCOUNTER — Telehealth: Payer: Self-pay

## 2023-04-23 LAB — SARS CORONAVIRUS 2 (TAT 6-24 HRS): SARS Coronavirus 2: POSITIVE — AB

## 2023-04-23 MED ORDER — PAXLOVID (300/100) 20 X 150 MG & 10 X 100MG PO TBPK: 3.0000 | ORAL_TABLET | Freq: Two times a day (BID) | ORAL | 0 refills | Status: AC

## 2023-05-08 ENCOUNTER — Encounter: Payer: Self-pay | Admitting: Family Medicine

## 2023-05-08 ENCOUNTER — Ambulatory Visit (INDEPENDENT_AMBULATORY_CARE_PROVIDER_SITE_OTHER): Payer: Medicare HMO | Admitting: Family Medicine

## 2023-05-08 VITALS — BP 163/89 | HR 79 | Temp 98.8°F | Wt 191.6 lb

## 2023-05-08 DIAGNOSIS — J988 Other specified respiratory disorders: Secondary | ICD-10-CM | POA: Diagnosis not present

## 2023-05-08 DIAGNOSIS — B9689 Other specified bacterial agents as the cause of diseases classified elsewhere: Secondary | ICD-10-CM | POA: Diagnosis not present

## 2023-05-08 MED ORDER — AMOXICILLIN-POT CLAVULANATE 875-125 MG PO TABS: 1.0000 | ORAL_TABLET | Freq: Two times a day (BID) | ORAL | 0 refills | Status: DC

## 2023-05-08 NOTE — Patient Instructions (Signed)
Medication as directed. ? ?Call with concerns. ? ?Take care ? ?Dr. Cook ?

## 2023-05-08 NOTE — Progress Notes (Signed)
Subjective:  Patient ID: Melissa Wiley, female    DOB: [birth year]  Age: 72 y.o. MRN: [id number]  CC: Respiratory symptoms   HPI:  72 year old female presents for evaluation the above.  Patient reports recent COVID-19 3 weeks ago.  Patient reports that she continues to have a dry cough as well as sinus congestion.  Also still having fatigue.  No fever.  No shortness of breath.  She states that she is better than she was but her symptoms are still persistent.  No other complaints or concerns at this time.   Patient Active Problem List   Diagnosis Date Noted   Bacterial respiratory infection 05/08/2023   Multinodular thyroid 12/11/2022   Encephalomalacia on imaging study 10/09/2022   History of Helicobacter pylori infection 11/09/2021   Hyperlipidemia 11/09/2021   Osteopenia 11/09/2021   Prediabetes 03/22/2020   GERD (gastroesophageal reflux disease) 02/04/2019   Arthritis of left knee 10/26/2014   Insomnia 10/26/2014   Anxiety and depression 10/26/2014   Essential hypertension, benign 02/03/2014    Social Hx   Social History   Socioeconomic History   Marital status: Single    Spouse name: Not on file   Number of children: 2   Years of education: Not on file   Highest education level: Not on file  Occupational History   Not on file  Tobacco Use   Smoking status: Never   Smokeless tobacco: Never  Vaping Use   Vaping status: Never Used  Substance and Sexual Activity   Alcohol use: Never   Drug use: Never   Sexual activity: Yes  Other Topics Concern   Not on file  Social History Narrative   SPECIAL NEEDS DAUGHTER PASSED IN 2019.   One daughter currently living with her and 1 granddaughter    2 living daughters.   6 grandchildren   6 great grandchildren   Social Determinants of Health   Financial Resource Strain: Low Risk  (08/10/2022)   Overall Financial Resource Strain (CARDIA)    Difficulty of Paying Living Expenses: Not hard at all  Food Insecurity: No  Food Insecurity (08/10/2022)   Hunger Vital Sign    Worried About Running Out of Food in the Last Year: Never true    Ran Out of Food in the Last Year: Never true  Transportation Needs: No Transportation Needs (08/10/2022)   PRAPARE - Administrator, Civil Service (Medical): No    Lack of Transportation (Non-Medical): No  Physical Activity: Sufficiently Active (08/10/2022)   Exercise Vital Sign    Days of Exercise per Week: 4 days    Minutes of Exercise per Session: 60 min  Stress: Stress Concern Present (08/10/2022)   [employer] of Occupational Health - Occupational Stress Questionnaire    Feeling of Stress : Very much  Social Connections: Moderately Integrated (08/10/2022)   Social Connection and Isolation Panel [NHANES]    Frequency of Communication with Friends and Family: More than three times a week    Frequency of Social Gatherings with Friends and Family: Three times a week    Attends Religious Services: More than 4 times per year    Active Member of Clubs or Organizations: Yes    Attends Banker Meetings: More than 4 times per year    Marital Status: Never married    Review of Systems Per HPI  Objective:  BP (!) 163/89   Pulse 79   Temp 98.8 F (37.1 C)   Wt 191 lb 9.6  oz (86.9 kg)   SpO2 99%   BMI 33.94 kg/m      05/08/2023    4:48 PM 05/08/2023    4:10 PM 04/22/2023   10:43 AM  BP/Weight  Systolic BP 163 167 129  Diastolic BP 89 90 74  Wt. (Lbs)  191.6   BMI  33.94 kg/m2     Physical Exam Vitals and nursing note reviewed.  Constitutional:      General: She is not in acute distress.    Appearance: Normal appearance.  HENT:     Head: Normocephalic and atraumatic.     Right Ear: Tympanic membrane normal.     Left Ear: Tympanic membrane normal.     Mouth/Throat:     Pharynx: Oropharynx is clear.  Cardiovascular:     Rate and Rhythm: Normal rate and regular rhythm.  Pulmonary:     Effort: Pulmonary effort is normal.      Breath sounds: Normal breath sounds. No wheezing or rales.  Neurological:     Mental Status: She is alert.  Psychiatric:        Mood and Affect: Mood normal.        Behavior: Behavior normal.     Lab Results  Component Value Date   WBC 12.4 (H) 06/02/2022   HGB 12.4 06/02/2022   HCT 41.5 06/02/2022   PLT 227 06/02/2022   GLUCOSE 78 06/05/2022   CHOL 195 02/16/2021   TRIG 207 (H) 02/16/2021   HDL 44 02/16/2021   LDLCALC 115 (H) 02/16/2021   ALT 10 02/16/2021   AST 10 02/16/2021   NA 143 06/05/2022   K 3.8 06/05/2022   CL 102 06/05/2022   CREATININE 0.65 06/05/2022   BUN 14 06/05/2022   CO2 26 06/05/2022   TSH 1.450 11/12/2019   HGBA1C 5.7 (H) 08/03/2020     Assessment & Plan:   Problem List Items Addressed This Visit       Respiratory   Bacterial respiratory infection - Primary    Treating with Augmentin.       Meds ordered this encounter  Medications   amoxicillin-clavulanate (AUGMENTIN) 875-125 MG tablet    Sig: Take 1 tablet by mouth 2 (two) times daily.    Dispense:  14 tablet    Refill:  0    Follow-up:  Return if symptoms worsen or fail to improve.  Everlene Other DO [facility]

## 2023-05-08 NOTE — Assessment & Plan Note (Signed)
Treating with Augmentin. 

## 2023-05-15 ENCOUNTER — Other Ambulatory Visit: Payer: Self-pay | Admitting: Family Medicine

## 2023-06-12 ENCOUNTER — Ambulatory Visit (INDEPENDENT_AMBULATORY_CARE_PROVIDER_SITE_OTHER): Payer: Medicare HMO | Admitting: Family Medicine

## 2023-06-12 VITALS — BP 148/92 | HR 74 | Temp 99.4°F | Ht 63.0 in | Wt 194.8 lb

## 2023-06-12 DIAGNOSIS — I10 Essential (primary) hypertension: Secondary | ICD-10-CM | POA: Diagnosis not present

## 2023-06-12 DIAGNOSIS — Z23 Encounter for immunization: Secondary | ICD-10-CM

## 2023-06-12 DIAGNOSIS — Z130 Encounter for screening for diseases of the blood and blood-forming organs and certain disorders involving the immune mechanism: Secondary | ICD-10-CM

## 2023-06-12 DIAGNOSIS — E785 Hyperlipidemia, unspecified: Secondary | ICD-10-CM

## 2023-06-12 DIAGNOSIS — R7303 Prediabetes: Secondary | ICD-10-CM

## 2023-06-12 MED ORDER — HYDROCHLOROTHIAZIDE 25 MG PO TABS: ORAL_TABLET | ORAL | 3 refills | Status: DC

## 2023-06-12 NOTE — Patient Instructions (Signed)
Labs today.  Medication refilled.  Follow up in 6 months.

## 2023-06-14 DIAGNOSIS — R7303 Prediabetes: Secondary | ICD-10-CM | POA: Diagnosis not present

## 2023-06-14 DIAGNOSIS — Z130 Encounter for screening for diseases of the blood and blood-forming organs and certain disorders involving the immune mechanism: Secondary | ICD-10-CM | POA: Diagnosis not present

## 2023-06-14 DIAGNOSIS — E785 Hyperlipidemia, unspecified: Secondary | ICD-10-CM | POA: Diagnosis not present

## 2023-06-14 DIAGNOSIS — I10 Essential (primary) hypertension: Secondary | ICD-10-CM | POA: Diagnosis not present

## 2023-06-15 ENCOUNTER — Other Ambulatory Visit: Payer: Self-pay | Admitting: Nurse Practitioner

## 2023-06-15 DIAGNOSIS — I10 Essential (primary) hypertension: Secondary | ICD-10-CM

## 2023-06-15 LAB — CMP14+EGFR
ALT: 12 [IU]/L (ref 0–32)
AST: 15 [IU]/L (ref 0–40)
Albumin: 4.6 g/dL (ref 3.8–4.8)
Alkaline Phosphatase: 83 [IU]/L (ref 44–121)
BUN/Creatinine Ratio: 14 (ref 12–28)
BUN: 10 mg/dL (ref 8–27)
Bilirubin Total: 0.3 mg/dL (ref 0.0–1.2)
CO2: 25 mmol/L (ref 20–29)
Calcium: 9.5 mg/dL (ref 8.7–10.3)
Chloride: 99 mmol/L (ref 96–106)
Creatinine, Ser: 0.72 mg/dL (ref 0.57–1.00)
Globulin, Total: 2.9 g/dL (ref 1.5–4.5)
Glucose: 98 mg/dL (ref 70–99)
Potassium: 4.1 mmol/L (ref 3.5–5.2)
Sodium: 142 mmol/L (ref 134–144)
Total Protein: 7.5 g/dL (ref 6.0–8.5)
eGFR: 88 mL/min/{1.73_m2} (ref >59)

## 2023-06-15 LAB — LIPID PANEL
Chol/HDL Ratio: 4.6 {ratio} — ABNORMAL HIGH (ref 0.0–4.4)
Cholesterol, Total: 212 mg/dL — ABNORMAL HIGH (ref 100–199)
HDL: 46 mg/dL (ref >39)
LDL Chol Calc (NIH): 115 mg/dL — ABNORMAL HIGH (ref 0–99)
Triglycerides: 294 mg/dL — ABNORMAL HIGH (ref 0–149)
VLDL Cholesterol Cal: 51 mg/dL — ABNORMAL HIGH (ref 5–40)

## 2023-06-15 LAB — CBC
Hematocrit: 41.4 % (ref 34.0–46.6)
Hemoglobin: 12.3 g/dL (ref 11.1–15.9)
MCH: 24.9 pg — ABNORMAL LOW (ref 26.6–33.0)
MCHC: 29.7 g/dL — ABNORMAL LOW (ref 31.5–35.7)
MCV: 84 fL (ref 79–97)
Platelets: 318 10*3/uL (ref 150–450)
RBC: 4.93 x10E6/uL (ref 3.77–5.28)
RDW: 13.1 % (ref 11.7–15.4)
WBC: 8.3 10*3/uL (ref 3.4–10.8)

## 2023-06-15 LAB — HEMOGLOBIN A1C
Est. average glucose Bld gHb Est-mCnc: 123 mg/dL
Hgb A1c MFr Bld: 5.9 % — ABNORMAL HIGH (ref 4.8–5.6)

## 2023-06-16 NOTE — Assessment & Plan Note (Signed)
Uncontrolled. Restarting hydrochlorothiazide.

## 2023-06-16 NOTE — Progress Notes (Signed)
Subjective:  Patient ID: Melissa Wiley, female    DOB: 04-01-1951  Age: 72 y.o. MRN: [id number]  CC: Follow up  HPI:  72 year old female presents for follow up.  BP mildly elevated today. Has not been taking hydrochlorothiazide (she has been out). Is compliant with Enalapril.   Has had a mild headache for the past 2 days. Responds to Tylenol.   Needs labs. Okay with flu shot.  Patient Active Problem List   Diagnosis Date Noted   Multinodular thyroid 12/11/2022   Encephalomalacia on imaging study 10/09/2022   History of Helicobacter pylori infection 11/09/2021   Hyperlipidemia 11/09/2021   Osteopenia 11/09/2021   Prediabetes 03/22/2020   GERD (gastroesophageal reflux disease) 02/04/2019   Arthritis of left knee 10/26/2014   Insomnia 10/26/2014   Anxiety and depression 10/26/2014   Essential hypertension, benign 02/03/2014    Social Hx   Social History   Socioeconomic History   Marital status: Single    Spouse name: Not on file   Number of children: 2   Years of education: Not on file   Highest education level: Not on file  Occupational History   Not on file  Tobacco Use   Smoking status: Never   Smokeless tobacco: Never  Vaping Use   Vaping status: Never Used  Substance and Sexual Activity   Alcohol use: Never   Drug use: Never   Sexual activity: Yes  Other Topics Concern   Not on file  Social History Narrative   SPECIAL NEEDS DAUGHTER PASSED IN 2019.   One daughter currently living with her and 1 granddaughter    2 living daughters.   6 grandchildren   6 great grandchildren   Social Determinants of Health   Financial Resource Strain: Low Risk  (08/10/2022)   Overall Financial Resource Strain (CARDIA)    Difficulty of Paying Living Expenses: Not hard at all  Food Insecurity: No Food Insecurity (08/10/2022)   Hunger Vital Sign    Worried About Running Out of Food in the Last Year: Never true    Ran Out of Food in the Last Year: Never true   Transportation Needs: No Transportation Needs (08/10/2022)   PRAPARE - Administrator, Civil Service (Medical): No    Lack of Transportation (Non-Medical): No  Physical Activity: Sufficiently Active (08/10/2022)   Exercise Vital Sign    Days of Exercise per Week: 4 days    Minutes of Exercise per Session: 60 min  Stress: Stress Concern Present (08/10/2022)   [employer] of Occupational Health - Occupational Stress Questionnaire    Feeling of Stress : Very much  Social Connections: Moderately Integrated (08/10/2022)   Social Connection and Isolation Panel [NHANES]    Frequency of Communication with Friends and Family: More than three times a week    Frequency of Social Gatherings with Friends and Family: Three times a week    Attends Religious Services: More than 4 times per year    Active Member of Clubs or Organizations: Yes    Attends Banker Meetings: More than 4 times per year    Marital Status: Never married    Review of Systems Per HPI  Objective:  BP (!) 148/92   Pulse 74   Temp 99.4 F (37.4 C) (Oral)   Ht 5\' 3"  (1.6 m)   Wt 194 lb 12.8 oz (88.4 kg)   BMI 34.51 kg/m      06/12/2023   10:40 AM 06/12/2023  10:22 AM 05/08/2023    4:48 PM  BP/Weight  Systolic BP 148 157 163  Diastolic BP 92 95 89  Wt. (Lbs)  194.8   BMI  34.51 kg/m2     Physical Exam Vitals and nursing note reviewed.  Constitutional:      General: She is not in acute distress.    Appearance: Normal appearance.  HENT:     Head: Normocephalic and atraumatic.  Eyes:     General:        Right eye: No discharge.        Left eye: No discharge.     Conjunctiva/sclera: Conjunctivae normal.  Cardiovascular:     Rate and Rhythm: Normal rate and regular rhythm.  Pulmonary:     Effort: Pulmonary effort is normal.     Breath sounds: Normal breath sounds. No wheezing, rhonchi or rales.  Neurological:     Mental Status: She is alert.  Psychiatric:        Mood and Affect:  Mood normal.        Behavior: Behavior normal.     Lab Results  Component Value Date   WBC 8.3 06/14/2023   HGB 12.3 06/14/2023   HCT 41.4 06/14/2023   PLT 318 06/14/2023   GLUCOSE 98 06/14/2023   CHOL 212 (H) 06/14/2023   TRIG 294 (H) 06/14/2023   HDL 46 06/14/2023   LDLCALC 115 (H) 06/14/2023   ALT 12 06/14/2023   AST 15 06/14/2023   NA 142 06/14/2023   K 4.1 06/14/2023   CL 99 06/14/2023   CREATININE 0.72 06/14/2023   BUN 10 06/14/2023   CO2 25 06/14/2023   TSH 1.450 11/12/2019   HGBA1C 5.9 (H) 06/14/2023     Assessment & Plan:   Problem List Items Addressed This Visit       Cardiovascular and Mediastinum   Essential hypertension, benign - Primary    Uncontrolled. Restarting hydrochlorothiazide.      Relevant Medications   hydrochlorothiazide (HYDRODIURIL) 25 MG tablet   Other Relevant Orders   CMP14+EGFR (Completed)     Other   Prediabetes   Relevant Orders   Hemoglobin A1c (Completed)   Hyperlipidemia   Relevant Medications   hydrochlorothiazide (HYDRODIURIL) 25 MG tablet   Other Relevant Orders   Lipid panel (Completed)   Other Visit Diagnoses     Needs flu shot       Relevant Orders   Flu Vaccine Trivalent High Dose (Fluad) (Completed)   Screening for deficiency anemia       Relevant Orders   CBC (Completed)       Meds ordered this encounter  Medications   hydrochlorothiazide (HYDRODIURIL) 25 MG tablet    Sig: Take 1/2 (one-half) tablet by mouth once daily    Dispense:  45 tablet    Refill:  3    Follow-up:  Return in about 6 months (around 12/11/2023) for HTN follow up.  Everlene Other DO [facility]

## 2023-06-19 ENCOUNTER — Other Ambulatory Visit: Payer: Self-pay | Admitting: Family Medicine

## 2023-06-19 MED ORDER — ROSUVASTATIN CALCIUM 10 MG PO TABS: 10.0000 mg | ORAL_TABLET | Freq: Every day | ORAL | 3 refills | Status: AC

## 2023-08-11 ENCOUNTER — Other Ambulatory Visit: Payer: Self-pay | Admitting: Family Medicine

## 2023-08-16 ENCOUNTER — Ambulatory Visit (INDEPENDENT_AMBULATORY_CARE_PROVIDER_SITE_OTHER): Payer: Medicare HMO

## 2023-08-16 VITALS — Ht 63.0 in | Wt 192.0 lb

## 2023-08-16 DIAGNOSIS — Z780 Asymptomatic menopausal state: Secondary | ICD-10-CM

## 2023-08-16 DIAGNOSIS — Z0000 Encounter for general adult medical examination without abnormal findings: Secondary | ICD-10-CM

## 2023-08-16 DIAGNOSIS — Z1159 Encounter for screening for other viral diseases: Secondary | ICD-10-CM

## 2023-08-16 DIAGNOSIS — Z1231 Encounter for screening mammogram for malignant neoplasm of breast: Secondary | ICD-10-CM

## 2023-08-16 NOTE — Patient Instructions (Signed)
Melissa Wiley , Thank you for taking time to come for your Medicare Wellness Visit. I appreciate your ongoing commitment to your health goals. Please review the following plan we discussed and let me know if I can assist you in the future.   Referrals/Orders/Follow-Ups/Clinician Recommendations:  Next Medicare Annual Wellness Visit: August 21, 2024 at 2:20pm virtual visit  You have an order for:  []   2D Mammogram  [x]   3D Mammogram  [x]   Bone Density   []   Lung Cancer Screening  Please call for appointment:   [facility] [address] [place], [place] [address] [phone]  Make sure to wear two-piece clothing.  No lotions powders or deodorants the day of the appointment Make sure to bring picture ID and insurance card.  Bring list of medications you are currently taking including any supplements.   Schedule your [place] screening mammogram through MyChart!   Log into your MyChart account.  Go to 'Visit' (or 'Appointments' if on mobile App) --> Schedule an Appointment  Under 'Select a Reason for Visit' choose the Mammogram Screening option.  Complete the pre-visit questions and select the time and place that best fits your schedule.  A Hepatitis C Screening has been ordered for you today. You do not have to fast to have this lab drawn.    This is a list of the screening recommended for you and due dates:  Health Maintenance  Topic Date Due   Hepatitis C Screening  Never done   DTaP/Tdap/Td vaccine (1 - Tdap) Never done   Zoster (Shingles) Vaccine (1 of 2) Never done   COVID-19 Vaccine (3 - Moderna risk series) 02/19/2020   Mammogram  11/21/2021   DEXA scan (bone density measurement)  05/12/2022   Medicare Annual Wellness Visit  08/15/2024   Colon Cancer Screening  02/04/2028   Pneumonia Vaccine  Completed   Flu Shot  Completed   HPV Vaccine  Aged Out    Advanced directives: (ACP Link)Information on Advanced Care Planning can  be found at [facility] of [place] Advance Health Care Directives Advance Health Care Directives ([email or web address]/)   Next Medicare Annual Wellness Visit scheduled for next year: Yes  Preventive Care 74 Years and Older, Female Preventive care refers to lifestyle choices and visits with your health care provider that can promote health and wellness. Preventive care visits are also called wellness exams. What can I expect for my preventive care visit? Counseling Your health care provider may ask you questions about your: Medical history, including: Past medical problems. Family medical history. Pregnancy and menstrual history. History of falls. Current health, including: Memory and ability to understand (cognition). Emotional well-being. Home life and relationship well-being. Sexual activity and sexual health. Lifestyle, including: Alcohol, nicotine or tobacco, and drug use. Access to firearms. Diet, exercise, and sleep habits. Work and work Astronomer. Sunscreen use. Safety issues such as seatbelt and bike helmet use. Physical exam Your health care provider will check your: Height and weight. These may be used to calculate your BMI (body mass index). BMI is a measurement that tells if you are at a healthy weight. Waist circumference. This measures the distance around your waistline. This measurement also tells if you are at a healthy weight and may help predict your risk of certain diseases, such as type 2 diabetes and high blood pressure. Heart rate and blood pressure. Body temperature. Skin for abnormal spots. What immunizations do I need?  Vaccines are usually given  at various ages, according to a schedule. Your health care provider will recommend vaccines for you based on your age, medical history, and lifestyle or other factors, such as travel or where you work. What tests do I need? Screening Your health care provider may recommend screening tests for certain  conditions. This may include: Lipid and cholesterol levels. Hepatitis C test. Hepatitis B test. HIV (human immunodeficiency virus) test. STI (sexually transmitted infection) testing, if you are at risk. Lung cancer screening. Colorectal cancer screening. Diabetes screening. This is done by checking your blood sugar (glucose) after you have not eaten for a while (fasting). Mammogram. Talk with your health care provider about how often you should have regular mammograms. BRCA-related cancer screening. This may be done if you have a family history of breast, ovarian, tubal, or peritoneal cancers. Bone density scan. This is done to screen for osteoporosis. Talk with your health care provider about your test results, treatment options, and if necessary, the need for more tests. Follow these instructions at home: Eating and drinking  Eat a diet that includes fresh fruits and vegetables, whole grains, lean protein, and low-fat dairy products. Limit your intake of foods with high amounts of sugar, saturated fats, and salt. Take vitamin and mineral supplements as recommended by your health care provider. Do not drink alcohol if your health care provider tells you not to drink. If you drink alcohol: Limit how much you have to 0-1 drink a day. Know how much alcohol is in your drink. In the [place] one drink equals one 12 oz bottle of beer (355 mL), one 5 oz glass of wine (148 mL), or one 1 oz glass of hard liquor (44 mL). Lifestyle Brush your teeth every morning and night with fluoride toothpaste. Floss one time each day. Exercise for at least 30 minutes 5 or more days each week. Do not use any products that contain nicotine or tobacco. These products include cigarettes, chewing tobacco, and vaping devices, such as e-cigarettes. If you need help quitting, ask your health care provider. Do not use drugs. If you are sexually active, practice safe sex. Use a condom or other form of protection in order to  prevent STIs. Take aspirin only as told by your health care provider. Make sure that you understand how much to take and what form to take. Work with your health care provider to find out whether it is safe and beneficial for you to take aspirin daily. Ask your health care provider if you need to take a cholesterol-lowering medicine (statin). Find healthy ways to manage stress, such as: Meditation, yoga, or listening to music. Journaling. Talking to a trusted person. Spending time with friends and family. Minimize exposure to UV radiation to reduce your risk of skin cancer. Safety Always wear your seat belt while driving or riding in a vehicle. Do not drive: If you have been drinking alcohol. Do not ride with someone who has been drinking. When you are tired or distracted. While texting. If you have been using any mind-altering substances or drugs. Wear a helmet and other protective equipment during sports activities. If you have firearms in your house, make sure you follow all gun safety procedures. What's next? Visit your health care provider once a year for an annual wellness visit. Ask your health care provider how often you should have your eyes and teeth checked. Stay up to date on all vaccines. This information is not intended to replace advice given to you by your health care  provider. Make sure you discuss any questions you have with your health care provider. Document Revised: 02/15/2021 Document Reviewed: 02/15/2021 Elsevier Patient Education  2024 [employer]. Understanding Your Risk for Falls Millions of people have serious injuries from falls each year. It is important to understand your risk of falling. Talk with your health care provider about your risk and what you can do to lower it. If you do have a serious fall, make sure to tell your provider. Falling once raises your risk of falling again. How can falls affect me? Serious injuries from falls are common. These  include: Broken bones, such as hip fractures. Head injuries, such as traumatic brain injuries (TBI) or concussions. A fear of falling can cause you to avoid activities and stay at home. This can make your muscles weaker and raise your risk for a fall. What can increase my risk? There are a number of risk factors that increase your risk for falling. The more risk factors you have, the higher your risk of falling. Serious injuries from a fall happen most often to people who are older than 74 years old. Teenagers and young adults ages 33-29 are also at higher risk. Common risk factors include: Weakness in the lower body. Being generally weak or confused due to long-term (chronic) illness. Dizziness or balance problems. Poor vision. Medicines that cause dizziness or drowsiness. These may include: Medicines for your blood pressure, heart, anxiety, insomnia, or swelling (edema). Pain medicines. Muscle relaxants. Other risk factors include: Drinking alcohol. Having had a fall in the past. Having foot pain or wearing improper footwear. Working at a dangerous job. Having any of the following in your home: Tripping hazards, such as floor clutter or loose rugs. Poor lighting. Pets. Having dementia or memory loss. What actions can I take to lower my risk of falling?     Physical activity Stay physically fit. Do strength and balance exercises. Consider taking a regular class to build strength and balance. Yoga and tai chi are good options. Vision Have your eyes checked every year and your prescription for glasses or contacts updated as needed. Shoes and walking aids Wear non-skid shoes. Wear shoes that have rubber soles and low heels. Do not wear high heels. Do not walk around the house in socks or slippers. Use a cane or walker as told by your provider. Home safety Attach secure railings on both sides of your stairs. Install grab bars for your bathtub, shower, and toilet. Use a non-skid  mat in your bathtub or shower. Attach bath mats securely with double-sided, non-slip rug tape. Use good lighting in all rooms. Keep a flashlight near your bed. Make sure there is a clear path from your bed to the bathroom. Use night-lights. Do not use throw rugs. Make sure all carpeting is taped or tacked down securely. Remove all clutter from walkways and stairways, including extension cords. Repair uneven or broken steps and floors. Avoid walking on icy or slippery surfaces. Walk on the grass instead of on icy or slick sidewalks. Use ice melter to get rid of ice on walkways in the winter. Use a cordless phone. Questions to ask your health care provider Can you help me check my risk for a fall? Do any of my medicines make me more likely to fall? Should I take a vitamin D supplement? What exercises can I do to improve my strength and balance? Should I make an appointment to have my vision checked? Do I need a bone density test to  check for weak bones (osteoporosis)? Would it help to use a cane or a walker? Where to find more information Centers for Disease Control and Prevention, [employer]: TonerPromos.no Community-Based Fall Prevention Programs: TonerPromos.no [employer] on Aging: BaseRingTones.pl Contact a health care provider if: You fall at home. You are afraid of falling at home. You feel weak, drowsy, or dizzy. This information is not intended to replace advice given to you by your health care provider. Make sure you discuss any questions you have with your health care provider. Document Revised: 04/23/2022 Document Reviewed: 04/23/2022 Elsevier Patient Education  2024 [employer].

## 2023-08-16 NOTE — Progress Notes (Signed)
Please attest and cosign this visit due to patients primary care provider not being in the office at the time the visit was completed.   Because this visit was a virtual/telehealth visit,  certain criteria was not obtained, such a blood pressure, CBG if applicable, and timed get up and go. Any medications not marked as "taking" were not mentioned during the medication reconciliation part of the visit. Any vitals not documented were not able to be obtained due to this being a telehealth visit or patient was unable to self-report a recent blood pressure reading due to a lack of equipment at home via telehealth. Vitals that have been documented are verbally provided by the patient.   Subjective:   Melissa Wiley is a 72 y.o. female who presents for Medicare Annual (Subsequent) preventive examination.  Visit Complete: Virtual I connected with  [name] on 08/16/23 by a audio enabled telemedicine application and verified that I am speaking with the correct person using two identifiers.  Patient Location: Home  Provider Location: Home Office  I discussed the limitations of evaluation and management by telemedicine. The patient expressed understanding and agreed to proceed.  Vital Signs: Because this visit was a virtual/telehealth visit, some criteria may be missing or patient reported. Any vitals not documented were not able to be obtained and vitals that have been documented are patient reported.  Patient Medicare AWV questionnaire was completed by the patient on na; I have confirmed that all information answered by patient is correct and no changes since this date.  Cardiac Risk Factors include: advanced age (>95men, >38 women);dyslipidemia;hypertension;obesity (BMI >30kg/m2);sedentary lifestyle     Objective:    Today's Vitals   08/16/23 1528 08/16/23 1530  Weight: 192 lb (87.1 kg)   Height: 5\' 3"  (1.6 m)   PainSc:  6    Body mass index is 34.01 kg/m.     08/16/2023    3:27  PM 11/01/2022   11:25 AM 08/10/2022    3:14 PM 06/02/2022    7:44 AM 07/25/2021    2:38 PM 08/19/2020   10:17 AM 08/05/2020    7:11 AM  Advanced Directives  Does Patient Have a Medical Advance Directive? No No No No No No No  Would patient like information on creating a medical advance directive? No - Patient declined  No - Patient declined No - Patient declined No - Patient declined  No - Patient declined    Current Medications (verified) Outpatient Encounter Medications as of 08/16/2023  Medication Sig   ALPRAZolam (XANAX) 0.5 MG tablet Take 1 tablet (0.5 mg total) by mouth 2 (two) times daily as needed for anxiety.   aspirin EC 81 MG tablet Take 81 mg by mouth daily. Swallow whole.   enalapril (VASOTEC) 20 MG tablet Take 1 tablet by mouth once daily   hydrochlorothiazide (HYDRODIURIL) 25 MG tablet Take 1/2 (one-half) tablet by mouth once daily   Multiple Vitamins-Minerals (MULTIVITAMIN WITH MINERALS) tablet Take 1 tablet by mouth daily.   Omega-3 Fatty Acids (FISH OIL) 1000 MG CAPS Take 1,000 mg by mouth daily.   PARoxetine (PAXIL) 20 MG tablet TAKE 2 TABLETS BY MOUTH IN THE MORNING   rosuvastatin (CRESTOR) 10 MG tablet Take 1 tablet (10 mg total) by mouth daily.   No facility-administered encounter medications on file as of 08/16/2023.    Allergies (verified) Patient has no known allergies.   History: Past Medical History:  Diagnosis Date   Anemia    Anxiety  Arthritis    Grand mal seizure disorder (HCC) 50's   Headache    Hx of migraines    Hypertension    Insomnia    Stress    Past Surgical History:  Procedure Laterality Date   BIOPSY  02/03/2018   Procedure: BIOPSY;  Surgeon: West Bali, MD;  Location: [facility];  Service: Endoscopy;;  duodenum,gastric   CATARACT EXTRACTION W/PHACO Right 08/05/2020   Procedure: CATARACT EXTRACTION PHACO AND INTRAOCULAR LENS PLACEMENT (IOC);  Surgeon: Fabio Pierce, MD;  Location: [facility];  Service: Ophthalmology;   Laterality: Right;  CDE: 13.10   CATARACT EXTRACTION W/PHACO Left 08/19/2020   Procedure: CATARACT EXTRACTION PHACO AND INTRAOCULAR LENS PLACEMENT (IOC);  Surgeon: Fabio Pierce, MD;  Location: [facility];  Service: Ophthalmology;  Laterality: Left;  CDE  7.16   CESAREAN SECTION  1985   COLONOSCOPY WITH PROPOFOL N/A 02/03/2018   Procedure: COLONOSCOPY WITH PROPOFOL;  Surgeon: West Bali, MD;  Location: [facility];  Service: Endoscopy;  Laterality: N/A;  7:30am   ESOPHAGOGASTRODUODENOSCOPY (EGD) WITH PROPOFOL N/A 02/03/2018   Procedure: ESOPHAGOGASTRODUODENOSCOPY (EGD) WITH PROPOFOL;  Surgeon: West Bali, MD;  Location: [facility];  Service: Endoscopy;  Laterality: N/A;   Family History  Problem Relation Age of Onset   Hypertension Mother    Hypertension Sister    Diabetes Sister    Heart disease Sister    Migraines Daughter    Migraines Daughter    Colon cancer Neg Hx    Colon polyps Neg Hx    Social History   Socioeconomic History   Marital status: Single    Spouse name: Not on file   Number of children: 2   Years of education: Not on file   Highest education level: Not on file  Occupational History   Not on file  Tobacco Use   Smoking status: Never   Smokeless tobacco: Never  Vaping Use   Vaping status: Never Used  Substance and Sexual Activity   Alcohol use: Never   Drug use: Never   Sexual activity: Yes  Other Topics Concern   Not on file  Social History Narrative   SPECIAL NEEDS DAUGHTER PASSED IN 2019.   One daughter currently living with her and 1 granddaughter    2 living daughters.   6 grandchildren   6 great grandchildren   Social Drivers of Corporate investment banker Strain: Low Risk  (08/16/2023)   Overall Financial Resource Strain (CARDIA)    Difficulty of Paying Living Expenses: Not hard at all  Food Insecurity: No Food Insecurity (08/16/2023)   Hunger Vital Sign    Worried About Running Out of Food in the Last Year: Never true    Ran  Out of Food in the Last Year: Never true  Transportation Needs: No Transportation Needs (08/16/2023)   PRAPARE - Administrator, Civil Service (Medical): No    Lack of Transportation (Non-Medical): No  Physical Activity: Insufficiently Active (08/16/2023)   Exercise Vital Sign    Days of Exercise per Week: 7 days    Minutes of Exercise per Session: 20 min  Stress: Stress Concern Present (08/16/2023)   [employer] of Occupational Health - Occupational Stress Questionnaire    Feeling of Stress : To some extent  Social Connections: Unknown (08/16/2023)   Social Connection and Isolation Panel [NHANES]    Frequency of Communication with Friends and Family: More than three times a week    Frequency of  Social Gatherings with Friends and Family: More than three times a week    Attends Religious Services: More than 4 times per year    Active Member of [employer] or Organizations: Yes    Attends Engineer, structural: More than 4 times per year    Marital Status: Patient declined    Tobacco Counseling Counseling given: Not Answered   Clinical Intake:  Pre-visit preparation completed: Yes  Pain : 0-10 Pain Score: 6  Pain Type: Acute pain, Chronic pain Pain Orientation: Left (hurting more since she helped her daughter move.) Pain Descriptors / Indicators: Constant, Aching Pain Onset: More than a month ago Pain Frequency: Constant     BMI - recorded: 34.01 Nutritional Status: BMI > 30  Obese Nutritional Risks: None Diabetes: No  How often do you need to have someone help you when you read instructions, pamphlets, or other written materials from your doctor or pharmacy?: 1 - Never  Interpreter Needed?: No  Information entered by :: Abby Jonathan Kirkendoll, CMA   Activities of Daily Living    08/16/2023    3:34 PM  In your present state of health, do you have any difficulty performing the following activities:  Hearing? 0  Vision? 0  Difficulty concentrating or  making decisions? 0  Walking or climbing stairs? 0  Dressing or bathing? 0  Doing errands, shopping? 0  Preparing Food and eating ? N  Using the Toilet? N  In the past six months, have you accidently leaked urine? N  Do you have problems with loss of bowel control? N  Managing your Medications? N  Managing your Finances? N  Housekeeping or managing your Housekeeping? N    Patient Care Team: Tommie Sams, DO as PCP - General (Family Medicine) Jonelle Sidle, MD as PCP - Cardiology (Cardiology) West Bali, MD (Inactive) as Consulting Physician (Gastroenterology) Drema Dallas, DO as Consulting Physician (Neurology)  Indicate any recent Medical Services you may have received from other than [facility] providers in the past year (date may be approximate).     Assessment:   This is a routine wellness examination for Sya.  Hearing/Vision screen Hearing Screening - Comments:: Patient denies any hearing difficulties.   Vision Screening - Comments:: Wears rx glasses - up to date with routine eye exams  Dr. Charise Killian   Goals Addressed             This Visit's Progress    Patient Stated       Remain active and healthy        Depression Screen    08/16/2023    3:31 PM 06/12/2023   10:18 AM 12/11/2022    9:57 AM 10/09/2022    9:25 AM 08/10/2022    3:13 PM 07/25/2021    2:33 PM 02/10/2021    1:48 PM  PHQ 2/9 Scores  PHQ - 2 Score 0 1 1 0 3 0 0  PHQ- 9 Score 0 3 3 1 6  1     Fall Risk    08/16/2023    3:34 PM 06/12/2023   10:18 AM 11/01/2022   11:25 AM 08/10/2022    3:11 PM 07/25/2021    2:39 PM  Fall Risk   Falls in the past year? 0 0 0 0 0  Number falls in past yr: 0 0 0 0 0  Injury with Fall? 0 0 0 0 0  Risk for fall due to : No Fall Risks   No Fall Risks No  Fall Risks  Follow up Falls prevention discussed  Falls evaluation completed Falls evaluation completed;Education provided;Falls prevention discussed Falls prevention discussed    MEDICARE RISK AT  HOME: Medicare Risk at Home Any stairs in or around the home?: No If so, are there any without handrails?: No Home free of loose throw rugs in walkways, pet beds, electrical cords, etc?: Yes Life alert?: No Grab bars in the bathroom?: No Shower chair or bench in shower?: No Elevated toilet seat or a handicapped toilet?: No  TIMED UP AND GO:  Was the test performed?  No    Cognitive Function:        08/16/2023    3:31 PM 08/10/2022    3:15 PM 07/25/2021    2:43 PM  6CIT Screen  What Year? 0 points 0 points 0 points  What month? 0 points 0 points 0 points  What time? 0 points 0 points 0 points  Count back from 20 0 points 0 points 0 points  Months in reverse 0 points 0 points 2 points  Repeat phrase 0 points 0 points 2 points  Total Score 0 points 0 points 4 points    Immunizations Immunization History  Administered Date(s) Administered   Fluad Trivalent(High Dose 65+) 06/12/2023   Influenza, High Dose Seasonal PF 07/07/2019   Influenza,inj,Quad PF,6+ Mos 05/01/2018, 07/08/2020   Influenza-Unspecified 07/10/2013, 07/13/2014, 06/07/2016, 07/08/2017, 07/07/2019   Moderna Sars-Covid-2 Vaccination 12/23/2019, 01/22/2020   Pneumococcal Conjugate-13 10/22/2016   Pneumococcal Polysaccharide-23 10/18/2015    TDAP status: Due, Education has been provided regarding the importance of this vaccine. Advised may receive this vaccine at local pharmacy or Health Dept. Aware to provide a copy of the vaccination record if obtained from local pharmacy or Health Dept. Verbalized acceptance and understanding.  Flu Vaccine status: Up to date  Pneumococcal vaccine status: Up to date  Covid-19 vaccine status: Information provided on how to obtain vaccines.   Qualifies for Shingles Vaccine? Yes   Zostavax completed No   Shingrix Completed?: No.    Education has been provided regarding the importance of this vaccine. Patient has been advised to call insurance company to determine out of  pocket expense if they have not yet received this vaccine. Advised may also receive vaccine at local pharmacy or Health Dept. Verbalized acceptance and understanding.  Screening Tests Health Maintenance  Topic Date Due   Hepatitis C Screening  Never done   DTaP/Tdap/Td (1 - Tdap) Never done   Zoster Vaccines- Shingrix (1 of 2) Never done   COVID-19 Vaccine (3 - Moderna risk series) 02/19/2020   MAMMOGRAM  11/21/2021   DEXA SCAN  05/12/2022   Medicare Annual Wellness (AWV)  08/11/2023   Colonoscopy  02/04/2028   Pneumonia Vaccine 53+ Years old  Completed   INFLUENZA VACCINE  Completed   HPV VACCINES  Aged Out    Health Maintenance  Health Maintenance Due  Topic Date Due   Hepatitis C Screening  Never done   DTaP/Tdap/Td (1 - Tdap) Never done   Zoster Vaccines- Shingrix (1 of 2) Never done   COVID-19 Vaccine (3 - Moderna risk series) 02/19/2020   MAMMOGRAM  11/21/2021   DEXA SCAN  05/12/2022   Medicare Annual Wellness (AWV)  08/11/2023    Colorectal cancer screening: Type of screening: Colonoscopy. Completed 02/03/2018. Repeat every 10 years  Mammogram status: Ordered 08/16/2023. Pt provided with contact info and advised to call to schedule appt.   Bone Density status: Ordered 08/16/2023. Pt provided with contact info  and advised to call to schedule appt.  Lung Cancer Screening: (Low Dose CT Chest recommended if Age 57-80 years, 20 pack-year currently smoking OR have quit w/in 15years.) does not qualify.   Lung Cancer Screening Referral: na  Additional Screening:  Hepatitis C Screening: does qualify; ordered 08/16/2023  Vision Screening: Recommended annual ophthalmology exams for early detection of glaucoma and other disorders of the eye. Is the patient up to date with their annual eye exam?  Yes  Who is the provider or what is the name of the office in which the patient attends annual eye exams? [facility] If pt is not established with a provider, would they like to be  referred to a provider to establish care? No .   Dental Screening: Recommended annual dental exams for proper oral hygiene  Diabetic Foot Exam: na  Community Resource Referral / Chronic Care Management: CRR required this visit?  No   CCM required this visit?  No     Plan:     I have personally reviewed and noted the following in the patient's chart:   Medical and social history Use of alcohol, tobacco or illicit drugs  Current medications and supplements including opioid prescriptions. Patient is not currently taking opioid prescriptions. Functional ability and status Nutritional status Physical activity Advanced directives List of other physicians Hospitalizations, surgeries, and ER visits in previous 12 months Vitals Screenings to include cognitive, depression, and falls Referrals and appointments  In addition, I have reviewed and discussed with patient certain preventive protocols, quality metrics, and best practice recommendations. A written personalized care plan for preventive services as well as general preventive health recommendations were provided to patient.     Jordan Hawks [employer], CMA   08/16/2023   After Visit Summary: (Mail) Due to this being a telephonic visit, the after visit summary with patients personalized plan was offered to patient via mail   Nurse Notes: see routing comment

## 2023-08-19 ENCOUNTER — Ambulatory Visit (INDEPENDENT_AMBULATORY_CARE_PROVIDER_SITE_OTHER): Payer: Medicare HMO | Admitting: Family Medicine

## 2023-08-19 VITALS — BP 134/76 | HR 79 | Temp 97.2°F | Ht 63.0 in | Wt 197.0 lb

## 2023-08-19 DIAGNOSIS — M1712 Unilateral primary osteoarthritis, left knee: Secondary | ICD-10-CM | POA: Diagnosis not present

## 2023-08-19 DIAGNOSIS — Z780 Asymptomatic menopausal state: Secondary | ICD-10-CM

## 2023-08-19 MED ORDER — DICLOFENAC SODIUM 75 MG PO TBEC: 75.0000 mg | DELAYED_RELEASE_TABLET | Freq: Two times a day (BID) | ORAL | 0 refills | Status: DC | PRN

## 2023-08-19 NOTE — Assessment & Plan Note (Signed)
Recent worsening due to overactivity.  Diclofenac as prescribed.  Advised RICE.

## 2023-08-19 NOTE — Patient Instructions (Signed)
Rest. Ice.  Medication as directed.  Call [phone] to schedule mammogram.  Take care  Dr. Adriana Simas

## 2023-08-19 NOTE — Progress Notes (Signed)
Subjective:  Patient ID: Melissa Wiley, female    DOB: February 23, 1951  Age: 72 y.o. MRN: [id number]  CC:   Chief Complaint  Patient presents with   left knee pain    knee pain and swelling- chronic arthritis/ flare up     Recently helped daughter move - has used naproxen which is not really helping     HPI:  72 year old female presents for evaluation of the above.  2-week history of left knee pain.  Patient states that she has underlying osteoarthritis.  Recently worsened after helping her daughter move.  Pain currently 3/10 in severity.  She has been using over-the-counter naproxen without significant improvement.  She reports some swelling.  She has also been using heat.  Exacerbated by activity.  No other associated symptoms.    Patient declines hepatitis C screening.  Declines vaccines at this time.  She is amenable to DEXA scan and mammogram.  Information given regarding how to set up mammogram.  DEXA scan will be ordered.  Patient Active Problem List   Diagnosis Date Noted   Multinodular thyroid 12/11/2022   Encephalomalacia on imaging study 10/09/2022   History of Helicobacter pylori infection 11/09/2021   Hyperlipidemia 11/09/2021   Osteopenia 11/09/2021   Prediabetes 03/22/2020   GERD (gastroesophageal reflux disease) 02/04/2019   Arthritis of left knee 10/26/2014   Insomnia 10/26/2014   Anxiety and depression 10/26/2014   Essential hypertension, benign 02/03/2014    Social Hx   Social History   Socioeconomic History   Marital status: Single    Spouse name: Not on file   Number of children: 2   Years of education: Not on file   Highest education level: Not on file  Occupational History   Not on file  Tobacco Use   Smoking status: Never   Smokeless tobacco: Never  Vaping Use   Vaping status: Never Used  Substance and Sexual Activity   Alcohol use: Never   Drug use: Never   Sexual activity: Yes  Other Topics Concern   Not on file  Social History Narrative    SPECIAL NEEDS DAUGHTER PASSED IN 2019.   One daughter currently living with her and 1 granddaughter    2 living daughters.   6 grandchildren   6 great grandchildren   Social Drivers of Corporate investment banker Strain: Low Risk  (08/16/2023)   Overall Financial Resource Strain (CARDIA)    Difficulty of Paying Living Expenses: Not hard at all  Food Insecurity: No Food Insecurity (08/16/2023)   Hunger Vital Sign    Worried About Running Out of Food in the Last Year: Never true    Ran Out of Food in the Last Year: Never true  Transportation Needs: No Transportation Needs (08/16/2023)   PRAPARE - Administrator, Civil Service (Medical): No    Lack of Transportation (Non-Medical): No  Physical Activity: Insufficiently Active (08/16/2023)   Exercise Vital Sign    Days of Exercise per Week: 7 days    Minutes of Exercise per Session: 20 min  Stress: Stress Concern Present (08/16/2023)   [employer] of Occupational Health - Occupational Stress Questionnaire    Feeling of Stress : To some extent  Social Connections: Unknown (08/16/2023)   Social Connection and Isolation Panel [NHANES]    Frequency of Communication with Friends and Family: More than three times a week    Frequency of Social Gatherings with Friends and Family: More than three times a week  Attends Religious Services: More than 4 times per year    Active Member of Clubs or Organizations: Yes    Attends Banker Meetings: More than 4 times per year    Marital Status: Patient declined    Review of Systems Per HPI  Objective:  BP 134/76   Pulse 79   Temp (!) 97.2 F (36.2 C)   Ht 5\' 3"  (1.6 m)   Wt 197 lb (89.4 kg)   SpO2 100%   BMI 34.90 kg/m      08/19/2023    9:09 AM 08/16/2023    3:28 PM 06/12/2023   10:40 AM  BP/Weight  Systolic BP 134 -- 148  Diastolic BP 76 -- 92  Wt. (Lbs) 197 192   BMI 34.9 kg/m2 34.01 kg/m2     Physical Exam Vitals and nursing note reviewed.   Constitutional:      General: She is not in acute distress.    Appearance: Normal appearance.  HENT:     Head: Normocephalic and atraumatic.  Cardiovascular:     Rate and Rhythm: Normal rate and regular rhythm.  Pulmonary:     Effort: Pulmonary effort is normal.     Breath sounds: Normal breath sounds. No wheezing, rhonchi or rales.  Musculoskeletal:     Comments: Left knee with lateral joint line tenderness to palpation.  Neurological:     Mental Status: She is alert.     Lab Results  Component Value Date   WBC 8.3 06/14/2023   HGB 12.3 06/14/2023   HCT 41.4 06/14/2023   PLT 318 06/14/2023   GLUCOSE 98 06/14/2023   CHOL 212 (H) 06/14/2023   TRIG 294 (H) 06/14/2023   HDL 46 06/14/2023   LDLCALC 115 (H) 06/14/2023   ALT 12 06/14/2023   AST 15 06/14/2023   NA 142 06/14/2023   K 4.1 06/14/2023   CL 99 06/14/2023   CREATININE 0.72 06/14/2023   BUN 10 06/14/2023   CO2 25 06/14/2023   TSH 1.450 11/12/2019   HGBA1C 5.9 (H) 06/14/2023     Assessment & Plan:   Problem List Items Addressed This Visit       Musculoskeletal and Integument   Arthritis of left knee - Primary   Recent worsening due to overactivity.  Diclofenac as prescribed.  Advised RICE.      Relevant Medications   diclofenac (VOLTAREN) 75 MG EC tablet   Other Visit Diagnoses       Post-menopausal       Relevant Orders   DG Bone Density       Meds ordered this encounter  Medications   diclofenac (VOLTAREN) 75 MG EC tablet    Sig: Take 1 tablet (75 mg total) by mouth 2 (two) times daily as needed for moderate pain (pain score 4-6).    Dispense:  30 tablet    Refill:  0    Follow-up:  Has follow up in April with me.  Everlene Other DO [employer] [facility]

## 2023-09-11 ENCOUNTER — Other Ambulatory Visit: Payer: Self-pay | Admitting: Nurse Practitioner

## 2023-09-11 DIAGNOSIS — I10 Essential (primary) hypertension: Secondary | ICD-10-CM

## 2023-09-16 ENCOUNTER — Ambulatory Visit (HOSPITAL_COMMUNITY)
Admission: RE | Admit: 2023-09-16 | Discharge: 2023-09-16 | Disposition: A | Discharge status: Home / Self Care | Payer: Medicare HMO | Source: Ambulatory Visit | Attending: Family Medicine | Admitting: Family Medicine

## 2023-09-16 DIAGNOSIS — Z780 Asymptomatic menopausal state: Secondary | ICD-10-CM | POA: Insufficient documentation

## 2023-09-23 ENCOUNTER — Ambulatory Visit (HOSPITAL_COMMUNITY)
Admission: RE | Admit: 2023-09-23 | Discharge: 2023-09-23 | Disposition: A | Discharge status: Home / Self Care | Payer: Medicare HMO | Source: Ambulatory Visit | Attending: Family Medicine | Admitting: Family Medicine

## 2023-09-23 ENCOUNTER — Encounter (HOSPITAL_COMMUNITY): Payer: Self-pay

## 2023-09-23 DIAGNOSIS — Z1231 Encounter for screening mammogram for malignant neoplasm of breast: Secondary | ICD-10-CM | POA: Diagnosis not present

## 2023-09-30 ENCOUNTER — Other Ambulatory Visit: Payer: Self-pay | Admitting: Family Medicine

## 2023-10-14 ENCOUNTER — Other Ambulatory Visit: Payer: Self-pay | Admitting: Family Medicine

## 2023-10-21 ENCOUNTER — Ambulatory Visit: Payer: Medicare HMO | Admitting: Family Medicine

## 2023-10-21 ENCOUNTER — Encounter: Payer: Self-pay | Admitting: Family Medicine

## 2023-10-21 VITALS — BP 139/84 | HR 87 | Temp 98.0°F | Ht 63.0 in | Wt 198.0 lb

## 2023-10-21 DIAGNOSIS — J988 Other specified respiratory disorders: Secondary | ICD-10-CM

## 2023-10-21 MED ORDER — PROMETHAZINE-DM 6.25-15 MG/5ML PO SYRP: 5.0000 mL | ORAL_SOLUTION | Freq: Four times a day (QID) | ORAL | 0 refills | Status: DC | PRN

## 2023-10-21 MED ORDER — AMOXICILLIN-POT CLAVULANATE 875-125 MG PO TABS: 1.0000 | ORAL_TABLET | Freq: Two times a day (BID) | ORAL | 0 refills | Status: DC

## 2023-10-21 NOTE — Progress Notes (Signed)
 Subjective:  Patient ID: Melissa Wiley, female    DOB: 01/11/1951  Age: 73 y.o. MRN: [id number]  CC:   Chief Complaint  Patient presents with   Sore Throat    Sore throat, non productive cough, headache x's 1 week    HPI:  73 year old female presents for evaluation of respiratory symptoms.  Patient reports that she has been sick for over a week.  She reports postnasal drip, cough, congestion, headache.  She is most bothered by cough.  She states that she has tried over-the-counter treatment without resolution.  Continues to feel poorly.  She states she had some sweats initially.  No documented fever.  No other associated symptoms.  No other complaints.  Patient Active Problem List   Diagnosis Date Noted   Multinodular thyroid 12/11/2022   Encephalomalacia on imaging study 10/09/2022   History of Helicobacter pylori infection 11/09/2021   Hyperlipidemia 11/09/2021   Osteopenia 11/09/2021   Prediabetes 03/22/2020   GERD (gastroesophageal reflux disease) 02/04/2019   Arthritis of left knee 10/26/2014   Insomnia 10/26/2014   Anxiety and depression 10/26/2014   Essential hypertension, benign 02/03/2014    Social Hx   Social History   Socioeconomic History   Marital status: Single    Spouse name: Not on file   Number of children: 2   Years of education: Not on file   Highest education level: Not on file  Occupational History   Not on file  Tobacco Use   Smoking status: Never   Smokeless tobacco: Never  Vaping Use   Vaping status: Never Used  Substance and Sexual Activity   Alcohol use: Never   Drug use: Never   Sexual activity: Yes  Other Topics Concern   Not on file  Social History Narrative   SPECIAL NEEDS DAUGHTER PASSED IN 2019.   One daughter currently living with her and 1 granddaughter    2 living daughters.   6 grandchildren   6 great grandchildren   Social Drivers of Corporate investment banker Strain: Low Risk  (08/16/2023)   Overall Financial  Resource Strain (CARDIA)    Difficulty of Paying Living Expenses: Not hard at all  Food Insecurity: No Food Insecurity (08/16/2023)   Hunger Vital Sign    Worried About Running Out of Food in the Last Year: Never true    Ran Out of Food in the Last Year: Never true  Transportation Needs: No Transportation Needs (08/16/2023)   PRAPARE - Administrator, Civil Service (Medical): No    Lack of Transportation (Non-Medical): No  Physical Activity: Insufficiently Active (08/16/2023)   Exercise Vital Sign    Days of Exercise per Week: 7 days    Minutes of Exercise per Session: 20 min  Stress: Stress Concern Present (08/16/2023)   [employer] of Occupational Health - Occupational Stress Questionnaire    Feeling of Stress : To some extent  Social Connections: Unknown (08/16/2023)   Social Connection and Isolation Panel [NHANES]    Frequency of Communication with Friends and Family: More than three times a week    Frequency of Social Gatherings with Friends and Family: More than three times a week    Attends Religious Services: More than 4 times per year    Active Member of [employer] or Organizations: Yes    Attends Engineer, structural: More than 4 times per year    Marital Status: Patient declined    Review of Systems Per HPI  Objective:  BP 139/84   Pulse 87   Temp 98 F (36.7 C)   Ht 5\' 3"  (1.6 m)   Wt 198 lb (89.8 kg)   SpO2 98%   BMI 35.07 kg/m      10/21/2023    9:59 AM 08/19/2023    9:09 AM 08/16/2023    3:28 PM  BP/Weight  Systolic BP 139 134 --  Diastolic BP 84 76 --  Wt. (Lbs) 198 197 192  BMI 35.07 kg/m2 34.9 kg/m2 34.01 kg/m2    Physical Exam Constitutional:      General: She is not in acute distress.    Appearance: Normal appearance.  HENT:     Head: Normocephalic and atraumatic.     Nose: Congestion present.     Mouth/Throat:     Pharynx: Oropharynx is clear.  Cardiovascular:     Rate and Rhythm: Normal rate and regular rhythm.   Pulmonary:     Effort: Pulmonary effort is normal.     Breath sounds: Normal breath sounds. No wheezing or rales.  Neurological:     Mental Status: She is alert.     Lab Results  Component Value Date   WBC 8.3 06/14/2023   HGB 12.3 06/14/2023   HCT 41.4 06/14/2023   PLT 318 06/14/2023   GLUCOSE 98 06/14/2023   CHOL 212 (H) 06/14/2023   TRIG 294 (H) 06/14/2023   HDL 46 06/14/2023   LDLCALC 115 (H) 06/14/2023   ALT 12 06/14/2023   AST 15 06/14/2023   NA 142 06/14/2023   K 4.1 06/14/2023   CL 99 06/14/2023   CREATININE 0.72 06/14/2023   BUN 10 06/14/2023   CO2 25 06/14/2023   TSH 1.450 11/12/2019   HGBA1C 5.9 (H) 06/14/2023     Assessment & Plan:   73 year old female presents with respiratory infection. Given duration of illness and lack of improvement, placing on Augmentin and Promethazine DM. Supportive care.  Meds ordered this encounter  Medications   amoxicillin-clavulanate (AUGMENTIN) 875-125 MG tablet    Sig: Take 1 tablet by mouth 2 (two) times daily.    Dispense:  14 tablet    Refill:  0   promethazine-dextromethorphan (PROMETHAZINE-DM) 6.25-15 MG/5ML syrup    Sig: Take 5 mLs by mouth 4 (four) times daily as needed (Cough and congestion).    Dispense:  118 mL    Refill:  0    Follow-up:  Return if symptoms worsen or fail to improve.  Everlene Other DO [facility]

## 2023-10-21 NOTE — Patient Instructions (Signed)
Rest. Fluids.  Medications as directed.  Take care  Dr. Virgia Kelner 

## 2023-11-08 ENCOUNTER — Other Ambulatory Visit: Payer: Self-pay | Admitting: Family Medicine

## 2023-12-09 ENCOUNTER — Other Ambulatory Visit: Payer: Self-pay | Admitting: Nurse Practitioner

## 2023-12-09 DIAGNOSIS — I10 Essential (primary) hypertension: Secondary | ICD-10-CM

## 2023-12-11 ENCOUNTER — Encounter: Payer: Self-pay | Admitting: Family Medicine

## 2023-12-11 ENCOUNTER — Ambulatory Visit: Payer: Medicare HMO | Admitting: Family Medicine

## 2023-12-11 VITALS — BP 129/76 | HR 72 | Temp 98.1°F | Ht 63.0 in | Wt 198.0 lb

## 2023-12-11 DIAGNOSIS — M25562 Pain in left knee: Secondary | ICD-10-CM | POA: Diagnosis not present

## 2023-12-11 DIAGNOSIS — M25561 Pain in right knee: Secondary | ICD-10-CM | POA: Diagnosis not present

## 2023-12-11 DIAGNOSIS — G8929 Other chronic pain: Secondary | ICD-10-CM

## 2023-12-11 DIAGNOSIS — F32A Depression, unspecified: Secondary | ICD-10-CM | POA: Diagnosis not present

## 2023-12-11 DIAGNOSIS — I10 Essential (primary) hypertension: Secondary | ICD-10-CM

## 2023-12-11 DIAGNOSIS — F419 Anxiety disorder, unspecified: Secondary | ICD-10-CM | POA: Diagnosis not present

## 2023-12-11 MED ORDER — ALPRAZOLAM 0.5 MG PO TABS: 0.5000 mg | ORAL_TABLET | Freq: Two times a day (BID) | ORAL | 0 refills | Status: DC | PRN

## 2023-12-11 MED ORDER — ENALAPRIL MALEATE 20 MG PO TABS
20.0000 mg | ORAL_TABLET | Freq: Every day | ORAL | 3 refills | Status: AC
Start: 2023-12-11 — End: ?

## 2023-12-11 MED ORDER — NAPROXEN 500 MG PO TABS: 500.0000 mg | ORAL_TABLET | Freq: Two times a day (BID) | ORAL | 1 refills | Status: AC | PRN

## 2023-12-11 NOTE — Assessment & Plan Note (Addendum)
 Stable continue medications.  Enalapril refilled.

## 2023-12-11 NOTE — Assessment & Plan Note (Signed)
 Worsening.  X-rays for evaluation.  Naproxen as directed.

## 2023-12-11 NOTE — Progress Notes (Signed)
 Subjective:  Patient ID: Melissa Wiley, female    DOB: Dec 12, 1950  Age: 73 y.o. MRN: [id number]  CC:   Chief Complaint  Patient presents with   Follow-up    6 month f/u hypertension    Arthritis    Arthritis in knees has gotten worse. Would like medication to help.     HPI:  73 year old female presents for follow-up.  Hypertension stable on HCTZ and enalapril.  Anxiety stable on alprazolam.  Needs refill.  Patient reports persistent bilateral knee pain.  Worsening.  Diclofenac helps some but is short-lived.  She is requesting something else to help her pain.  No imaging studies available.  Patient Active Problem List   Diagnosis Date Noted   Chronic pain of both knees 12/11/2023   Multinodular thyroid 12/11/2022   Encephalomalacia on imaging study 10/09/2022   History of Helicobacter pylori infection 11/09/2021   Hyperlipidemia 11/09/2021   Osteopenia 11/09/2021   Prediabetes 03/22/2020   GERD (gastroesophageal reflux disease) 02/04/2019   Insomnia 10/26/2014   Anxiety and depression 10/26/2014   Essential hypertension, benign 02/03/2014    Social Hx   Social History   Socioeconomic History   Marital status: Single    Spouse name: Not on file   Number of children: 2   Years of education: Not on file   Highest education level: Not on file  Occupational History   Not on file  Tobacco Use   Smoking status: Never   Smokeless tobacco: Never  Vaping Use   Vaping status: Never Used  Substance and Sexual Activity   Alcohol use: Never   Drug use: Never   Sexual activity: Yes  Other Topics Concern   Not on file  Social History Narrative   SPECIAL NEEDS DAUGHTER PASSED IN 2019.   One daughter currently living with her and 1 granddaughter    2 living daughters.   6 grandchildren   6 great grandchildren   Social Drivers of Corporate investment banker Strain: Low Risk  (08/16/2023)   Overall Financial Resource Strain (CARDIA)    Difficulty of Paying Living  Expenses: Not hard at all  Food Insecurity: No Food Insecurity (08/16/2023)   Hunger Vital Sign    Worried About Running Out of Food in the Last Year: Never true    Ran Out of Food in the Last Year: Never true  Transportation Needs: No Transportation Needs (08/16/2023)   PRAPARE - Administrator, Civil Service (Medical): No    Lack of Transportation (Non-Medical): No  Physical Activity: Insufficiently Active (08/16/2023)   Exercise Vital Sign    Days of Exercise per Week: 7 days    Minutes of Exercise per Session: 20 min  Stress: Stress Concern Present (08/16/2023)   [employer] of Occupational Health - Occupational Stress Questionnaire    Feeling of Stress : To some extent  Social Connections: Unknown (08/16/2023)   Social Connection and Isolation Panel [NHANES]    Frequency of Communication with Friends and Family: More than three times a week    Frequency of Social Gatherings with Friends and Family: More than three times a week    Attends Religious Services: More than 4 times per year    Active Member of [employer] or Organizations: Yes    Attends Engineer, structural: More than 4 times per year    Marital Status: Patient declined    Review of Systems Per HPI  Objective:  BP 129/76   Pulse 72  Temp 98.1 F (36.7 C)   Ht 5\' 3"  (1.6 m)   Wt 198 lb (89.8 kg)   SpO2 98%   BMI 35.07 kg/m      12/11/2023   10:25 AM 10/21/2023    9:59 AM 08/19/2023    9:09 AM  BP/Weight  Systolic BP 129 139 134  Diastolic BP 76 84 76  Wt. (Lbs) 198 198 197  BMI 35.07 kg/m2 35.07 kg/m2 34.9 kg/m2    Physical Exam Vitals and nursing note reviewed.  Constitutional:      General: She is not in acute distress.    Appearance: Normal appearance.  HENT:     Head: Normocephalic and atraumatic.  Cardiovascular:     Rate and Rhythm: Normal rate and regular rhythm.  Pulmonary:     Effort: Pulmonary effort is normal.     Breath sounds: Normal breath sounds. No  wheezing, rhonchi or rales.  Musculoskeletal:     Comments: No appreciable effusion of the knee.  Nontender to palpation.  Neurological:     Mental Status: She is alert.  Psychiatric:        Mood and Affect: Mood normal.        Behavior: Behavior normal.     Lab Results  Component Value Date   WBC 8.3 06/14/2023   HGB 12.3 06/14/2023   HCT 41.4 06/14/2023   PLT 318 06/14/2023   GLUCOSE 98 06/14/2023   CHOL 212 (H) 06/14/2023   TRIG 294 (H) 06/14/2023   HDL 46 06/14/2023   LDLCALC 115 (H) 06/14/2023   ALT 12 06/14/2023   AST 15 06/14/2023   NA 142 06/14/2023   K 4.1 06/14/2023   CL 99 06/14/2023   CREATININE 0.72 06/14/2023   BUN 10 06/14/2023   CO2 25 06/14/2023   TSH 1.450 11/12/2019   HGBA1C 5.9 (H) 06/14/2023     Assessment & Plan:  Essential hypertension, benign Assessment & Plan: Stable continue medications.  Enalapril refilled.  Orders: -     Enalapril Maleate; Take 1 tablet (20 mg total) by mouth daily.  Dispense: 90 tablet; Refill: 3  Chronic pain of both knees Assessment & Plan: Worsening.  X-rays for evaluation.  Naproxen as directed.  Orders: -     DG Knee Bilateral Standing [facility]  Anxiety and depression Assessment & Plan: Anxiety stable.  Alprazolam refilled.   Other orders -     ALPRAZolam; Take 1 tablet (0.5 mg total) by mouth 2 (two) times daily as needed. for anxiety  Dispense: 60 tablet; Refill: 0 -     Naproxen; Take 1 tablet (500 mg total) by mouth 2 (two) times daily as needed for moderate pain (pain score 4-6). Take with food.  Dispense: 60 tablet; Refill: 1    Follow-up:  6 months  Talaysia Pinheiro Adriana Simas DO [facility]

## 2023-12-11 NOTE — Patient Instructions (Signed)
 Xray at the hospital.  Medication as directed.  Follow up in 6 months.  Take care  Dr. Adriana Simas

## 2023-12-11 NOTE — Assessment & Plan Note (Signed)
 Anxiety stable.  Alprazolam refilled.

## 2023-12-13 ENCOUNTER — Ambulatory Visit (HOSPITAL_COMMUNITY)
Admission: RE | Admit: 2023-12-13 | Discharge: 2023-12-13 | Disposition: A | Discharge status: Home / Self Care | Source: Ambulatory Visit | Attending: Family Medicine | Admitting: Family Medicine

## 2023-12-13 DIAGNOSIS — G8929 Other chronic pain: Secondary | ICD-10-CM | POA: Diagnosis not present

## 2023-12-13 DIAGNOSIS — M25562 Pain in left knee: Secondary | ICD-10-CM | POA: Insufficient documentation

## 2023-12-13 DIAGNOSIS — M25561 Pain in right knee: Secondary | ICD-10-CM | POA: Diagnosis not present

## 2023-12-13 DIAGNOSIS — M170 Bilateral primary osteoarthritis of knee: Secondary | ICD-10-CM | POA: Diagnosis not present

## 2023-12-22 ENCOUNTER — Encounter: Payer: Self-pay | Admitting: Family Medicine

## 2023-12-25 ENCOUNTER — Encounter: Payer: Self-pay | Admitting: Internal Medicine

## 2023-12-25 ENCOUNTER — Ambulatory Visit: Admitting: Internal Medicine

## 2023-12-25 VITALS — BP 170/92 | HR 105 | Temp 98.2°F | Ht 63.0 in | Wt 202.1 lb

## 2023-12-25 DIAGNOSIS — K5900 Constipation, unspecified: Secondary | ICD-10-CM | POA: Diagnosis not present

## 2023-12-25 DIAGNOSIS — R140 Abdominal distension (gaseous): Secondary | ICD-10-CM | POA: Diagnosis not present

## 2023-12-25 DIAGNOSIS — K5904 Chronic idiopathic constipation: Secondary | ICD-10-CM

## 2023-12-25 DIAGNOSIS — K219 Gastro-esophageal reflux disease without esophagitis: Secondary | ICD-10-CM

## 2023-12-25 NOTE — Patient Instructions (Addendum)
 I am going to give you samples of Linzess 290 mcg daily.  Let me know in a week or so how you are doing and I can send in formal prescription if improved.  If this dose is too strong, we have room to decrease as needed.    Linzess works best when taken once a day every day, on an empty stomach, at least 30 minutes before your first meal of the day.  When Linzess is taken daily as directed:  *Constipation relief is typically felt in about a week *IBS-C patients may begin to experience relief from belly pain and overall abdominal symptoms (pain, discomfort, and bloating) in about 1 week,   with symptoms typically improving over 12 weeks.  Diarrhea may occur in the first 2 weeks -keep taking it.  The diarrhea should go away and you should start having normal, complete, full bowel movements. It may be helpful to start treatment when you can be near the comfort of your own bathroom, such as a weekend.   Follow-up in 2 to 3 months.  It was very nice seeing you again today.  Dr. Mordechai April

## 2023-12-25 NOTE — Progress Notes (Signed)
 Referring Provider: Cook, [name], DO Primary Care Physician:  Cook, [name], DO Primary GI:  Dr. Mordechai April  Chief Complaint  Patient presents with   Constipation    Patient here today with concerns of going weeks with out having a bm. Denies any abdominal pain. She has issues with gas,belching,bloating. She says she has gained weight,but she does not eat a lot.     HPI:   Melissa Wiley is a 73 y.o. female who presents to the clinic today for follow-up visit.  She has not been seen since 2023.    Chronic GERD: Previously well-controlled on pantoprazole  daily, now managing with diet modification.  Denies any dysphagia odynophagia.  No epigastric pain.  No melena hematochezia.    Chronic constipation, abdominal bloating: has worsened since retiring which she attribute to not being as active. Will sometimes go 2-3 weeks without having a BM. Reports associated abdominal bloating. When she does have a BM reports "small balls" of stool.  Previously improved on OTC stool softener though this is not working anymore  EGD 02/03/2018 with H. pylori gastritis.  Treated with triple therapy and subsequent eradication testing being negative.  Colonoscopy 02/03/2018 unremarkable with no recall given due to age.  Past Medical History:  Diagnosis Date   Anemia    Anxiety    Arthritis    Grand mal seizure disorder (HCC) 50's   Headache    Hx of migraines    Hypertension    Insomnia    Stress     Past Surgical History:  Procedure Laterality Date   BIOPSY  02/03/2018   Procedure: BIOPSY;  Surgeon: Alyce Jubilee, MD;  Location: [facility];  Service: Endoscopy;;  duodenum,gastric   CATARACT EXTRACTION W/PHACO Right 08/05/2020   Procedure: CATARACT EXTRACTION PHACO AND INTRAOCULAR LENS PLACEMENT (IOC);  Surgeon: Tarri Farm, MD;  Location: [facility];  Service: Ophthalmology;  Laterality: Right;  CDE: 13.10   CATARACT EXTRACTION W/PHACO Left 08/19/2020   Procedure: CATARACT EXTRACTION PHACO AND  INTRAOCULAR LENS PLACEMENT (IOC);  Surgeon: Tarri Farm, MD;  Location: [facility];  Service: Ophthalmology;  Laterality: Left;  CDE  7.16   CESAREAN SECTION  1985   COLONOSCOPY WITH PROPOFOL  N/A 02/03/2018   Procedure: COLONOSCOPY WITH PROPOFOL ;  Surgeon: Alyce Jubilee, MD;  Location: [facility];  Service: Endoscopy;  Laterality: N/A;  7:30am   ESOPHAGOGASTRODUODENOSCOPY (EGD) WITH PROPOFOL  N/A 02/03/2018   Procedure: ESOPHAGOGASTRODUODENOSCOPY (EGD) WITH PROPOFOL ;  Surgeon: Alyce Jubilee, MD;  Location: [facility];  Service: Endoscopy;  Laterality: N/A;    Current Outpatient Medications  Medication Sig Dispense Refill   ALPRAZolam  (XANAX ) 0.5 MG tablet Take 1 tablet (0.5 mg total) by mouth 2 (two) times daily as needed. for anxiety 60 tablet 0   aspirin EC 81 MG tablet Take 81 mg by mouth daily. Swallow whole.     enalapril  (VASOTEC ) 20 MG tablet Take 1 tablet (20 mg total) by mouth daily. 90 tablet 3   hydrochlorothiazide  (HYDRODIURIL ) 25 MG tablet Take 1/2 (one-half) tablet by mouth once daily 45 tablet 3   Multiple Vitamins-Minerals (MULTIVITAMIN WITH MINERALS) tablet Take 1 tablet by mouth daily.     naproxen  (NAPROSYN ) 500 MG tablet Take 1 tablet (500 mg total) by mouth 2 (two) times daily as needed for moderate pain (pain score 4-6). Take with food. 60 tablet 1   Omega-3 Fatty Acids (FISH OIL) 1000 MG CAPS Take 1,000 mg by mouth daily.  PARoxetine  (PAXIL ) 20 MG tablet TAKE 2 TABLETS BY MOUTH IN THE MORNING 180 tablet 0   rosuvastatin  (CRESTOR ) 10 MG tablet Take 1 tablet (10 mg total) by mouth daily. 90 tablet 3   No current facility-administered medications for this visit.    Allergies as of 12/25/2023   (No Known Allergies)    Family History  Problem Relation Age of Onset   Hypertension Mother    Hypertension Sister    Diabetes Sister    Heart disease Sister    Migraines Daughter    Migraines Daughter    Colon cancer Neg Hx    Colon polyps Neg Hx     Social  History   Socioeconomic History   Marital status: Single    Spouse name: Not on file   Number of children: 2   Years of education: Not on file   Highest education level: Not on file  Occupational History   Not on file  Tobacco Use   Smoking status: Never   Smokeless tobacco: Never  Vaping Use   Vaping status: Never Used  Substance and Sexual Activity   Alcohol use: Never   Drug use: Never   Sexual activity: Yes  Other Topics Concern   Not on file  Social History Narrative   SPECIAL NEEDS DAUGHTER PASSED IN 2019.   One daughter currently living with her and 1 granddaughter    2 living daughters.   6 grandchildren   6 great grandchildren   Social Drivers of Corporate investment banker Strain: Low Risk  (08/16/2023)   Overall Financial Resource Strain (CARDIA)    Difficulty of Paying Living Expenses: Not hard at all  Food Insecurity: No Food Insecurity (08/16/2023)   Hunger Vital Sign    Worried About Running Out of Food in the Last Year: Never true    Ran Out of Food in the Last Year: Never true  Transportation Needs: No Transportation Needs (08/16/2023)   PRAPARE - Administrator, Civil Service (Medical): No    Lack of Transportation (Non-Medical): No  Physical Activity: Insufficiently Active (08/16/2023)   Exercise Vital Sign    Days of Exercise per Week: 7 days    Minutes of Exercise per Session: 20 min  Stress: Stress Concern Present (08/16/2023)   [employer] of Occupational Health - Occupational Stress Questionnaire    Feeling of Stress : To some extent  Social Connections: Unknown (08/16/2023)   Social Connection and Isolation Panel [NHANES]    Frequency of Communication with Friends and Family: More than three times a week    Frequency of Social Gatherings with Friends and Family: More than three times a week    Attends Religious Services: More than 4 times per year    Active Member of [employer] or Organizations: Yes    Attends Museum/gallery exhibitions officer: More than 4 times per year    Marital Status: Patient declined    Subjective: Review of Systems  Constitutional:  Negative for chills and fever.  HENT:  Negative for congestion and hearing loss.   Eyes:  Negative for blurred vision and double vision.  Respiratory:  Negative for cough and shortness of breath.   Cardiovascular:  Negative for chest pain and palpitations.  Gastrointestinal:  Positive for constipation. Negative for abdominal pain, blood in stool, diarrhea, heartburn, melena and vomiting.  Genitourinary:  Negative for dysuria and urgency.  Musculoskeletal:  Negative for joint pain and myalgias.  Skin:  Negative for itching  and rash.  Neurological:  Negative for dizziness and headaches.  Psychiatric/Behavioral:  Negative for depression. The patient is not nervous/anxious.      Objective: BP (!) 170/92 (BP Location: Left Arm, Patient Position: Sitting, Cuff Size: Large)   Pulse (!) 105   Temp 98.2 F (36.8 C) (Temporal)   Ht 5\' 3"  (1.6 m)   Wt 202 lb 1.6 oz (91.7 kg)   BMI 35.80 kg/m  Physical Exam Constitutional:      Appearance: Normal appearance.  HENT:     Head: Normocephalic and atraumatic.  Eyes:     Extraocular Movements: Extraocular movements intact.     Conjunctiva/sclera: Conjunctivae normal.  Cardiovascular:     Rate and Rhythm: Normal rate and regular rhythm.  Pulmonary:     Effort: Pulmonary effort is normal.     Breath sounds: Normal breath sounds.  Abdominal:     General: Bowel sounds are normal.     Palpations: Abdomen is soft.  Musculoskeletal:        General: No swelling. Normal range of motion.     Cervical back: Normal range of motion and neck supple.  Skin:    General: Skin is warm and dry.     Coloration: Skin is not jaundiced.  Neurological:     General: No focal deficit present.     Mental Status: She is alert and oriented to person, place, and time.  Psychiatric:        Mood and Affect: Mood normal.         Behavior: Behavior normal.      Assessment/Plan:  1.  Abdominal bloating, constipation-not well-controlled on OTC stool softener.  Likely has component of IBS with constipation.  Samples of Linzess  290 mcg daily provided today.  Counseled on initial washout.  Counseled on room to decrease dose if too strong.  Call with update in 1 week and I can send in formal prescription if improved.  2.  Chronic GERD-previously well-controlled on pantoprazole  40 mg daily.  Now managing managing with diet modification.  Continue to monitor.  Follow-up in 3 months.  12/25/2023 2:36 PM   Disclaimer: This note was dictated with voice recognition software. Similar sounding words can inadvertently be transcribed and may not be corrected upon review.

## 2023-12-27 ENCOUNTER — Other Ambulatory Visit: Payer: Self-pay

## 2023-12-27 DIAGNOSIS — G8929 Other chronic pain: Secondary | ICD-10-CM

## 2023-12-27 NOTE — Telephone Encounter (Unsigned)
 Copied from CRM [id number]. Topic: Referral - Request for Referral >> Dec 27, 2023 11:20 AM Dorthula Gavel H wrote: Did the patient discuss referral with their provider in the last year? Yes (If No - schedule appointment) (If Yes - send message)  Appointment offered? No  Type of order/referral and detailed reason for visit: ORTHO  Preference of office, provider, location: Dr. Osborne Blazer [phone] , [address], [place] [address]  If referral order, have you been seen by this specialty before? No (If Yes, this issue or another issue? When? Where?  Can we respond through MyChart? Yes

## 2024-01-01 ENCOUNTER — Telehealth: Payer: Self-pay

## 2024-01-01 ENCOUNTER — Other Ambulatory Visit: Payer: Self-pay | Admitting: Internal Medicine

## 2024-01-01 MED ORDER — LINACLOTIDE 290 MCG PO CAPS: 290.0000 ug | ORAL_CAPSULE | Freq: Every day | ORAL | 3 refills | Status: AC

## 2024-01-01 NOTE — Telephone Encounter (Signed)
 Noted   Pt advised

## 2024-01-01 NOTE — Telephone Encounter (Signed)
 Returned the pt's call and was advised by her that the Linzess 290 mcg samples worked for her and she does want a formal Rx for them sent to [facility]

## 2024-01-01 NOTE — Telephone Encounter (Signed)
 Sent to pharmacy, ty

## 2024-01-14 ENCOUNTER — Encounter: Payer: Self-pay | Admitting: Internal Medicine

## 2024-01-29 DIAGNOSIS — M25562 Pain in left knee: Secondary | ICD-10-CM | POA: Diagnosis not present

## 2024-01-29 DIAGNOSIS — M25561 Pain in right knee: Secondary | ICD-10-CM | POA: Diagnosis not present

## 2024-02-04 ENCOUNTER — Other Ambulatory Visit: Payer: Self-pay | Admitting: Family Medicine

## 2024-02-24 ENCOUNTER — Other Ambulatory Visit: Payer: Self-pay | Admitting: Family Medicine

## 2024-03-05 ENCOUNTER — Ambulatory Visit (INDEPENDENT_AMBULATORY_CARE_PROVIDER_SITE_OTHER): Admitting: Internal Medicine

## 2024-03-05 VITALS — BP 123/77 | HR 76 | Temp 98.5°F | Ht 63.0 in | Wt 199.0 lb

## 2024-03-05 DIAGNOSIS — K581 Irritable bowel syndrome with constipation: Secondary | ICD-10-CM

## 2024-03-05 DIAGNOSIS — K5900 Constipation, unspecified: Secondary | ICD-10-CM

## 2024-03-05 DIAGNOSIS — K219 Gastro-esophageal reflux disease without esophagitis: Secondary | ICD-10-CM | POA: Diagnosis not present

## 2024-03-05 DIAGNOSIS — R140 Abdominal distension (gaseous): Secondary | ICD-10-CM | POA: Diagnosis not present

## 2024-03-05 NOTE — Patient Instructions (Signed)
 I am happy to hear you are doing better on the Linzess . Recommend you continue on this.   Recommend high fiber foods such as fruits, vegetables, chia seeds. You can try Prunes as well though this may make your gas/bloating worse.   Follow up in 4 months or sooner if needed.   It is always a pleasure seeing you.   Dr. Cindie

## 2024-03-05 NOTE — Progress Notes (Signed)
 Referring Provider: Cook, [name], DO Primary Care Physician:  Cook, [name], DO Primary GI:  Dr. Cindie  Chief Complaint  Patient presents with   Follow-up    Pt states things are a little bit better since the Linzess     HPI:   Melissa Wiley is a 73 y.o. female who presents to the clinic today for follow-up visit.  She has not been seen since 2023.    Chronic GERD: Previously well-controlled on pantoprazole  daily, now managing with diet modification.  Denies any dysphagia odynophagia.  No epigastric pain.  No melena hematochezia.    Chronic constipation, abdominal bloating: Previously improved on OTC stool softener though this stopped working. Was having a BM every 2 weeks or so.   Currently taking Linzess  290 mcg daily.  Reports her symptoms are improved. Having BM every 3-4 days, [place] stool #4.   EGD 02/03/2018 with H. pylori gastritis.  Treated with triple therapy and subsequent eradication testing being negative.  Colonoscopy 02/03/2018 unremarkable with no recall given due to age.  Past Medical History:  Diagnosis Date   Anemia    Anxiety    Arthritis    Grand mal seizure disorder (HCC) 50's   Headache    Hx of migraines    Hypertension    Insomnia    Stress     Past Surgical History:  Procedure Laterality Date   BIOPSY  02/03/2018   Procedure: BIOPSY;  Surgeon: Harvey Margo CROME, MD;  Location: [facility];  Service: Endoscopy;;  duodenum,gastric   CATARACT EXTRACTION W/PHACO Right 08/05/2020   Procedure: CATARACT EXTRACTION PHACO AND INTRAOCULAR LENS PLACEMENT (IOC);  Surgeon: Harrie Agent, MD;  Location: [facility];  Service: Ophthalmology;  Laterality: Right;  CDE: 13.10   CATARACT EXTRACTION W/PHACO Left 08/19/2020   Procedure: CATARACT EXTRACTION PHACO AND INTRAOCULAR LENS PLACEMENT (IOC);  Surgeon: Harrie Agent, MD;  Location: [facility];  Service: Ophthalmology;  Laterality: Left;  CDE  7.16   CESAREAN SECTION  1985   COLONOSCOPY WITH PROPOFOL  N/A 02/03/2018    Procedure: COLONOSCOPY WITH PROPOFOL ;  Surgeon: Harvey Margo CROME, MD;  Location: [facility];  Service: Endoscopy;  Laterality: N/A;  7:30am   ESOPHAGOGASTRODUODENOSCOPY (EGD) WITH PROPOFOL  N/A 02/03/2018   Procedure: ESOPHAGOGASTRODUODENOSCOPY (EGD) WITH PROPOFOL ;  Surgeon: Harvey Margo CROME, MD;  Location: [facility];  Service: Endoscopy;  Laterality: N/A;    Current Outpatient Medications  Medication Sig Dispense Refill   ALPRAZolam  (XANAX ) 0.5 MG tablet Take 1 tablet by mouth twice daily as needed for anxiety 60 tablet 0   aspirin EC 81 MG tablet Take 81 mg by mouth daily. Swallow whole.     enalapril  (VASOTEC ) 20 MG tablet Take 1 tablet (20 mg total) by mouth daily. 90 tablet 3   hydrochlorothiazide  (HYDRODIURIL ) 25 MG tablet Take 1/2 (one-half) tablet by mouth once daily 45 tablet 3   linaclotide  (LINZESS ) 290 MCG CAPS capsule Take 1 capsule (290 mcg total) by mouth daily before breakfast. 90 capsule 3   Multiple Vitamins-Minerals (MULTIVITAMIN WITH MINERALS) tablet Take 1 tablet by mouth daily.     naproxen  (NAPROSYN ) 500 MG tablet Take 1 tablet (500 mg total) by mouth 2 (two) times daily as needed for moderate pain (pain score 4-6). Take with food. 60 tablet 1   Omega-3 Fatty Acids (FISH OIL) 1000 MG CAPS Take 1,000 mg by mouth daily.     PARoxetine  (PAXIL ) 20 MG tablet TAKE 2 TABLETS BY MOUTH IN THE MORNING  180 tablet 0   rosuvastatin  (CRESTOR ) 10 MG tablet Take 1 tablet (10 mg total) by mouth daily. 90 tablet 3   No current facility-administered medications for this visit.    Allergies as of 03/05/2024   (No Known Allergies)    Family History  Problem Relation Age of Onset   Hypertension Mother    Hypertension Sister    Diabetes Sister    Heart disease Sister    Migraines Daughter    Migraines Daughter    Colon cancer Neg Hx    Colon polyps Neg Hx     Social History   Socioeconomic History   Marital status: Single    Spouse name: Not on file   Number of children: 2    Years of education: Not on file   Highest education level: Not on file  Occupational History   Not on file  Tobacco Use   Smoking status: Never   Smokeless tobacco: Never  Vaping Use   Vaping status: Never Used  Substance and Sexual Activity   Alcohol use: Never   Drug use: Never   Sexual activity: Yes  Other Topics Concern   Not on file  Social History Narrative   SPECIAL NEEDS DAUGHTER PASSED IN 2019.   One daughter currently living with her and 1 granddaughter    2 living daughters.   6 grandchildren   6 great grandchildren   Social Drivers of Corporate investment banker Strain: Low Risk  (08/16/2023)   Overall Financial Resource Strain (CARDIA)    Difficulty of Paying Living Expenses: Not hard at all  Food Insecurity: No Food Insecurity (08/16/2023)   Hunger Vital Sign    Worried About Running Out of Food in the Last Year: Never true    Ran Out of Food in the Last Year: Never true  Transportation Needs: No Transportation Needs (08/16/2023)   PRAPARE - Administrator, Civil Service (Medical): No    Lack of Transportation (Non-Medical): No  Physical Activity: Insufficiently Active (08/16/2023)   Exercise Vital Sign    Days of Exercise per Week: 7 days    Minutes of Exercise per Session: 20 min  Stress: Stress Concern Present (08/16/2023)   [employer] of Occupational Health - Occupational Stress Questionnaire    Feeling of Stress : To some extent  Social Connections: Unknown (08/16/2023)   Social Connection and Isolation Panel    Frequency of Communication with Friends and Family: More than three times a week    Frequency of Social Gatherings with Friends and Family: More than three times a week    Attends Religious Services: More than 4 times per year    Active Member of [employer] or Organizations: Yes    Attends Engineer, structural: More than 4 times per year    Marital Status: Patient declined    Subjective: Review of Systems   Constitutional:  Negative for chills and fever.  HENT:  Negative for congestion and hearing loss.   Eyes:  Negative for blurred vision and double vision.  Respiratory:  Negative for cough and shortness of breath.   Cardiovascular:  Negative for chest pain and palpitations.  Gastrointestinal:  Positive for constipation. Negative for abdominal pain, blood in stool, diarrhea, heartburn, melena and vomiting.  Genitourinary:  Negative for dysuria and urgency.  Musculoskeletal:  Negative for joint pain and myalgias.  Skin:  Negative for itching and rash.  Neurological:  Negative for dizziness and headaches.  Psychiatric/Behavioral:  Negative  for depression. The patient is not nervous/anxious.      Objective: BP 123/77   Pulse 76   Temp 98.5 F (36.9 C)   Ht 5' 3 (1.6 m)   Wt 199 lb (90.3 kg)   BMI 35.25 kg/m  Physical Exam Constitutional:      Appearance: Normal appearance.  HENT:     Head: Normocephalic and atraumatic.  Eyes:     Extraocular Movements: Extraocular movements intact.     Conjunctiva/sclera: Conjunctivae normal.  Cardiovascular:     Rate and Rhythm: Normal rate and regular rhythm.  Pulmonary:     Effort: Pulmonary effort is normal.     Breath sounds: Normal breath sounds.  Abdominal:     General: Bowel sounds are normal.     Palpations: Abdomen is soft.  Musculoskeletal:        General: No swelling. Normal range of motion.     Cervical back: Normal range of motion and neck supple.  Skin:    General: Skin is warm and dry.     Coloration: Skin is not jaundiced.  Neurological:     General: No focal deficit present.     Mental Status: She is alert and oriented to person, place, and time.  Psychiatric:        Mood and Affect: Mood normal.        Behavior: Behavior normal.      Assessment/Plan:  1.  Abdominal bloating, constipation-not well-controlled on OTC stool softener.  Likely has component of IBS with constipation.  Improved on Linzess  290 mcg  daily. Will continue.   2.  Chronic GERD-previously well-controlled on pantoprazole  40 mg daily.  Now managing managing with diet modification.  Continue to monitor.  Follow-up in 4 months.  03/05/2024 10:37 AM   Disclaimer: This note was dictated with voice recognition software. Similar sounding words can inadvertently be transcribed and may not be corrected upon review.

## 2024-04-08 ENCOUNTER — Other Ambulatory Visit: Payer: Self-pay | Admitting: Family Medicine

## 2024-04-15 DIAGNOSIS — H04123 Dry eye syndrome of bilateral lacrimal glands: Secondary | ICD-10-CM | POA: Diagnosis not present

## 2024-04-21 ENCOUNTER — Ambulatory Visit (INDEPENDENT_AMBULATORY_CARE_PROVIDER_SITE_OTHER): Admitting: Family Medicine

## 2024-04-21 VITALS — BP 119/76 | HR 76 | Temp 97.7°F | Ht 63.0 in | Wt 195.0 lb

## 2024-04-21 DIAGNOSIS — R519 Headache, unspecified: Secondary | ICD-10-CM | POA: Diagnosis not present

## 2024-04-21 DIAGNOSIS — R7303 Prediabetes: Secondary | ICD-10-CM | POA: Diagnosis not present

## 2024-04-21 DIAGNOSIS — E785 Hyperlipidemia, unspecified: Secondary | ICD-10-CM | POA: Diagnosis not present

## 2024-04-21 DIAGNOSIS — I10 Essential (primary) hypertension: Secondary | ICD-10-CM

## 2024-04-21 DIAGNOSIS — Z130 Encounter for screening for diseases of the blood and blood-forming organs and certain disorders involving the immune mechanism: Secondary | ICD-10-CM | POA: Diagnosis not present

## 2024-04-21 NOTE — Patient Instructions (Addendum)
 Labs today.  Tylenol  and/or Naproxen  as needed.  Take care  Dr. Bluford

## 2024-04-21 NOTE — Assessment & Plan Note (Signed)
 History and exam reassuring. Has had prior neuroimaging.  Advised tylenol  and naproxen  as needed. Labs today.

## 2024-04-21 NOTE — Progress Notes (Signed)
 Subjective:  Patient ID: Melissa Wiley, female    DOB: 05-01-1951  Age: 73 y.o. MRN: [id number]  CC:   Chief Complaint  Patient presents with   Headache    Last episode on Sunday lasted all day took xanax  and naproxen  Bp was elevated 160/90    HPI:  73 year old female presents for evaluation of the above.  Headache Intermittent headaches. Located in the frontal region. Occur infrequently but are severe when they occur. She states that this happens every couple of years. Recent headache on Sunday.  BP was elevated at home.  Lasted all day and was gone in the morning.  She reports that she took Xanax  and Naproxen .  Associated dizziness. No nausea, vomiting, or photophobia.  Has not occurred again.  She is concerned.  Patient Active Problem List   Diagnosis Date Noted   Headache 04/21/2024   Chronic pain of both knees 12/11/2023   Multinodular thyroid  12/11/2022   Encephalomalacia on imaging study 10/09/2022   History of Helicobacter pylori infection 11/09/2021   Hyperlipidemia 11/09/2021   Osteopenia 11/09/2021   Prediabetes 03/22/2020   GERD (gastroesophageal reflux disease) 02/04/2019   Insomnia 10/26/2014   Anxiety and depression 10/26/2014   Essential hypertension, benign 02/03/2014    Social Hx   Social History   Socioeconomic History   Marital status: Single    Spouse name: Not on file   Number of children: 2   Years of education: Not on file   Highest education level: Not on file  Occupational History   Not on file  Tobacco Use   Smoking status: Never   Smokeless tobacco: Never  Vaping Use   Vaping status: Never Used  Substance and Sexual Activity   Alcohol use: Never   Drug use: Never   Sexual activity: Yes  Other Topics Concern   Not on file  Social History Narrative   SPECIAL NEEDS DAUGHTER PASSED IN 2019.   One daughter currently living with her and 1 granddaughter    2 living daughters.   6 grandchildren   6 great grandchildren   Social  Drivers of Corporate investment banker Strain: Low Risk  (08/16/2023)   Overall Financial Resource Strain (CARDIA)    Difficulty of Paying Living Expenses: Not hard at all  Food Insecurity: No Food Insecurity (08/16/2023)   Hunger Vital Sign    Worried About Running Out of Food in the Last Year: Never true    Ran Out of Food in the Last Year: Never true  Transportation Needs: No Transportation Needs (08/16/2023)   PRAPARE - Administrator, Civil Service (Medical): No    Lack of Transportation (Non-Medical): No  Physical Activity: Insufficiently Active (08/16/2023)   Exercise Vital Sign    Days of Exercise per Week: 7 days    Minutes of Exercise per Session: 20 min  Stress: Stress Concern Present (08/16/2023)   [employer] of Occupational Health - Occupational Stress Questionnaire    Feeling of Stress : To some extent  Social Connections: Unknown (08/16/2023)   Social Connection and Isolation Panel    Frequency of Communication with Friends and Family: More than three times a week    Frequency of Social Gatherings with Friends and Family: More than three times a week    Attends Religious Services: More than 4 times per year    Active Member of [employer] or Organizations: Yes    Attends Banker Meetings: More than 4 times  per year    Marital Status: Patient declined    Review of Systems Per HPI  Objective:  BP 119/76   Pulse 76   Temp 97.7 F (36.5 C)   Ht 5' 3 (1.6 m)   Wt 195 lb (88.5 kg)   SpO2 97%   BMI 34.54 kg/m      04/21/2024   11:32 AM 03/05/2024   10:33 AM 12/25/2023    2:13 PM  BP/Weight  Systolic BP 119 123 170  Diastolic BP 76 77 92  Wt. (Lbs) 195 199   BMI 34.54 kg/m2 35.25 kg/m2     Physical Exam Vitals and nursing note reviewed.  Constitutional:      General: She is not in acute distress.    Appearance: Normal appearance.  HENT:     Head: Normocephalic and atraumatic.  Eyes:     General:        Right eye: No  discharge.        Left eye: No discharge.     Conjunctiva/sclera: Conjunctivae normal.  Cardiovascular:     Rate and Rhythm: Normal rate and regular rhythm.  Pulmonary:     Effort: Pulmonary effort is normal.     Breath sounds: Normal breath sounds. No wheezing, rhonchi or rales.  Neurological:     General: No focal deficit present.     Mental Status: She is alert.  Psychiatric:        Mood and Affect: Mood normal.        Behavior: Behavior normal.     Lab Results  Component Value Date   WBC 8.3 06/14/2023   HGB 12.3 06/14/2023   HCT 41.4 06/14/2023   PLT 318 06/14/2023   GLUCOSE 98 06/14/2023   CHOL 212 (H) 06/14/2023   TRIG 294 (H) 06/14/2023   HDL 46 06/14/2023   LDLCALC 115 (H) 06/14/2023   ALT 12 06/14/2023   AST 15 06/14/2023   NA 142 06/14/2023   K 4.1 06/14/2023   CL 99 06/14/2023   CREATININE 0.72 06/14/2023   BUN 10 06/14/2023   CO2 25 06/14/2023   TSH 1.450 11/12/2019   HGBA1C 5.9 (H) 06/14/2023     Assessment & Plan:  Nonintractable episodic headache, unspecified headache type Assessment & Plan: History and exam reassuring. Has had prior neuroimaging.  Advised tylenol  and naproxen  as needed. Labs today.   Prediabetes -     Hemoglobin A1c  Essential hypertension, benign -     CMP14+EGFR  Hyperlipidemia, unspecified hyperlipidemia type -     Lipid panel  Screening for deficiency anemia -     CBC    Follow-up:  Has scheduled follow up.  Jacqulyn Ahle DO [facility]

## 2024-04-22 DIAGNOSIS — I10 Essential (primary) hypertension: Secondary | ICD-10-CM | POA: Diagnosis not present

## 2024-04-22 DIAGNOSIS — E785 Hyperlipidemia, unspecified: Secondary | ICD-10-CM | POA: Diagnosis not present

## 2024-04-22 DIAGNOSIS — Z130 Encounter for screening for diseases of the blood and blood-forming organs and certain disorders involving the immune mechanism: Secondary | ICD-10-CM | POA: Diagnosis not present

## 2024-04-22 DIAGNOSIS — R7303 Prediabetes: Secondary | ICD-10-CM | POA: Diagnosis not present

## 2024-04-23 ENCOUNTER — Ambulatory Visit: Payer: Self-pay | Admitting: Family Medicine

## 2024-04-23 LAB — CBC
Hematocrit: 38.9 % (ref 34.0–46.6)
Hemoglobin: 11.7 g/dL (ref 11.1–15.9)
MCH: 25.3 pg — ABNORMAL LOW (ref 26.6–33.0)
MCHC: 30.1 g/dL — ABNORMAL LOW (ref 31.5–35.7)
MCV: 84 fL (ref 79–97)
Platelets: 252 x10E3/uL (ref 150–450)
RBC: 4.63 x10E6/uL (ref 3.77–5.28)
RDW: 12.8 % (ref 11.7–15.4)
WBC: 8 x10E3/uL (ref 3.4–10.8)

## 2024-04-23 LAB — CMP14+EGFR
ALT: 9 IU/L (ref 0–32)
AST: 11 IU/L (ref 0–40)
Albumin: 4.5 g/dL (ref 3.8–4.8)
Alkaline Phosphatase: 74 IU/L (ref 44–121)
BUN/Creatinine Ratio: 23 (ref 12–28)
BUN: 18 mg/dL (ref 8–27)
Bilirubin Total: 0.2 mg/dL (ref 0.0–1.2)
CO2: 24 mmol/L (ref 20–29)
Calcium: 9.5 mg/dL (ref 8.7–10.3)
Chloride: 99 mmol/L (ref 96–106)
Creatinine, Ser: 0.77 mg/dL (ref 0.57–1.00)
Globulin, Total: 2.4 g/dL (ref 1.5–4.5)
Glucose: 113 mg/dL — ABNORMAL HIGH (ref 70–99)
Potassium: 4.1 mmol/L (ref 3.5–5.2)
Sodium: 140 mmol/L (ref 134–144)
Total Protein: 6.9 g/dL (ref 6.0–8.5)
eGFR: 80 mL/min/1.73 (ref >59)

## 2024-04-23 LAB — LIPID PANEL
Chol/HDL Ratio: 3.5 ratio (ref 0.0–4.4)
Cholesterol, Total: 146 mg/dL (ref 100–199)
HDL: 42 mg/dL (ref >39)
LDL Chol Calc (NIH): 71 mg/dL (ref 0–99)
Triglycerides: 198 mg/dL — ABNORMAL HIGH (ref 0–149)
VLDL Cholesterol Cal: 33 mg/dL (ref 5–40)

## 2024-04-23 LAB — HEMOGLOBIN A1C
Est. average glucose Bld gHb Est-mCnc: 120 mg/dL
Hgb A1c MFr Bld: 5.8 % — ABNORMAL HIGH (ref 4.8–5.6)

## 2024-04-28 ENCOUNTER — Ambulatory Visit: Payer: Self-pay

## 2024-04-28 NOTE — Telephone Encounter (Signed)
 FYI Only or Action Required?: FYI only for provider.  Patient was last seen in primary care on 04/21/2024 by Cook, [name], DO.  Called Nurse Triage reporting No chief complaint on file..  Symptoms began today.  Interventions attempted: Nothing.  Symptoms are: unchanged.  Triage Disposition: Information or Advice Only Call  Patient/caregiver understands and will follow disposition?: Yes  Copied from CRM [id number]. Topic: Clinical - Lab/Test Results >> Apr 28, 2024 11:26 AM Rosaria BRAVO wrote: Reason for CRM: Pt wants to have her lab results mailed to her. Address on file verified.   Pt wants to also discuss her lab results. Reason for Disposition  Health information question, no triage required and triager able to answer question  Protocols used: Information Only Call - No Triage-[facility]

## 2024-05-12 ENCOUNTER — Other Ambulatory Visit: Payer: Self-pay | Admitting: Family Medicine

## 2024-06-02 ENCOUNTER — Other Ambulatory Visit: Payer: Self-pay | Admitting: Family Medicine

## 2024-06-25 ENCOUNTER — Ambulatory Visit: Admitting: Internal Medicine

## 2024-07-27 ENCOUNTER — Encounter: Payer: Self-pay | Admitting: Family Medicine

## 2024-07-27 ENCOUNTER — Ambulatory Visit (INDEPENDENT_AMBULATORY_CARE_PROVIDER_SITE_OTHER): Payer: Self-pay | Admitting: Family Medicine

## 2024-07-27 VITALS — BP 111/74 | HR 98 | Temp 97.3°F | Ht 63.0 in | Wt 193.0 lb

## 2024-07-27 DIAGNOSIS — J019 Acute sinusitis, unspecified: Secondary | ICD-10-CM | POA: Diagnosis not present

## 2024-07-27 MED ORDER — AMOXICILLIN-POT CLAVULANATE 875-125 MG PO TABS: 1.0000 | ORAL_TABLET | Freq: Two times a day (BID) | ORAL | 0 refills | Status: AC

## 2024-07-27 NOTE — Assessment & Plan Note (Signed)
 Treating with Augmentin.

## 2024-07-27 NOTE — Progress Notes (Signed)
 Subjective:  Patient ID: Melissa Wiley, female    DOB: June 24, 1951  Age: 73 y.o. MRN: [id number]  CC:   Chief Complaint  Patient presents with   head congestion and cough     Currently still has a dry cough - she is taking mucinex, feels like a lump in the throat - had headaches, sore throat, runny nose     HPI:  73 year old female presents for evaluation of the above.  Patient has been sick for a little over a week.  She reports sinus pressure and congestion, headaches, sore throat, postnasal drip, runny nose.  She is also had a dry cough.  Feels like she has a lump in her throat.  Subjective fever.  No relief with Mucinex.  Patient Active Problem List   Diagnosis Date Noted   Acute sinusitis 07/27/2024   Chronic pain of both knees 12/11/2023   Multinodular thyroid  12/11/2022   Encephalomalacia on imaging study 10/09/2022   History of Helicobacter pylori infection 11/09/2021   Hyperlipidemia 11/09/2021   Osteopenia 11/09/2021   Prediabetes 03/22/2020   GERD (gastroesophageal reflux disease) 02/04/2019   Insomnia 10/26/2014   Anxiety and depression 10/26/2014   Essential hypertension, benign 02/03/2014    Social Hx   Social History   Socioeconomic History   Marital status: Single    Spouse name: Not on file   Number of children: 2   Years of education: Not on file   Highest education level: Not on file  Occupational History   Not on file  Tobacco Use   Smoking status: Never   Smokeless tobacco: Never  Vaping Use   Vaping status: Never Used  Substance and Sexual Activity   Alcohol use: Never   Drug use: Never   Sexual activity: Yes  Other Topics Concern   Not on file  Social History Narrative   SPECIAL NEEDS DAUGHTER PASSED IN 2019.   One daughter currently living with her and 1 granddaughter    2 living daughters.   6 grandchildren   6 great grandchildren   Social Drivers of Corporate Investment Banker Strain: Low Risk  (08/16/2023)   Overall  Financial Resource Strain (CARDIA)    Difficulty of Paying Living Expenses: Not hard at all  Food Insecurity: No Food Insecurity (08/16/2023)   Hunger Vital Sign    Worried About Running Out of Food in the Last Year: Never true    Ran Out of Food in the Last Year: Never true  Transportation Needs: No Transportation Needs (08/16/2023)   PRAPARE - Administrator, Civil Service (Medical): No    Lack of Transportation (Non-Medical): No  Physical Activity: Insufficiently Active (08/16/2023)   Exercise Vital Sign    Days of Exercise per Week: 7 days    Minutes of Exercise per Session: 20 min  Stress: Stress Concern Present (08/16/2023)   [employer] of Occupational Health - Occupational Stress Questionnaire    Feeling of Stress : To some extent  Social Connections: Unknown (08/16/2023)   Social Connection and Isolation Panel    Frequency of Communication with Friends and Family: More than three times a week    Frequency of Social Gatherings with Friends and Family: More than three times a week    Attends Religious Services: More than 4 times per year    Active Member of [employer] or Organizations: Yes    Attends Banker Meetings: More than 4 times per year    Marital Status:  Patient declined    Review of Systems Per HPI  Objective:  BP 111/74   Pulse 98   Temp (!) 97.3 F (36.3 C)   Ht 5' 3 (1.6 m)   Wt 193 lb (87.5 kg)   SpO2 98%   BMI 34.19 kg/m      07/27/2024    8:22 AM 04/21/2024   11:32 AM 03/05/2024   10:33 AM  BP/Weight  Systolic BP 111 119 123  Diastolic BP 74 76 77  Wt. (Lbs) 193 195 199  BMI 34.19 kg/m2 34.54 kg/m2 35.25 kg/m2    Physical Exam Vitals and nursing note reviewed.  Constitutional:      General: She is not in acute distress.    Appearance: Normal appearance.  HENT:     Head: Normocephalic and atraumatic.     Nose: No rhinorrhea.     Mouth/Throat:     Pharynx: Oropharynx is clear.  Eyes:     General:        Right  eye: No discharge.        Left eye: No discharge.     Conjunctiva/sclera: Conjunctivae normal.  Cardiovascular:     Rate and Rhythm: Normal rate and regular rhythm.  Pulmonary:     Effort: Pulmonary effort is normal.     Breath sounds: Normal breath sounds. No wheezing, rhonchi or rales.  Neurological:     Mental Status: She is alert.  Psychiatric:        Mood and Affect: Mood normal.        Behavior: Behavior normal.     Lab Results  Component Value Date   WBC 8.0 04/22/2024   HGB 11.7 04/22/2024   HCT 38.9 04/22/2024   PLT 252 04/22/2024   GLUCOSE 113 (H) 04/22/2024   CHOL 146 04/22/2024   TRIG 198 (H) 04/22/2024   HDL 42 04/22/2024   LDLCALC 71 04/22/2024   ALT 9 04/22/2024   AST 11 04/22/2024   NA 140 04/22/2024   K 4.1 04/22/2024   CL 99 04/22/2024   CREATININE 0.77 04/22/2024   BUN 18 04/22/2024   CO2 24 04/22/2024   TSH 1.450 11/12/2019   HGBA1C 5.8 (H) 04/22/2024     Assessment & Plan:  Acute sinusitis, recurrence not specified, unspecified location Assessment & Plan: Treating with Augmentin .  Orders: -     Amoxicillin -Pot Clavulanate; Take 1 tablet by mouth 2 (two) times daily.  Dispense: 14 tablet; Refill: 0    Follow-up:  Return if symptoms worsen or fail to improve.  Jacqulyn Ahle DO [facility]

## 2024-08-07 ENCOUNTER — Ambulatory Visit: Payer: Self-pay

## 2024-08-07 NOTE — Telephone Encounter (Signed)
 FYI Only or Action Required?: Action required by provider: clinical question for provider.  Patient was last seen in primary care on 07/27/2024 by Cook, [name], DO.  Called Nurse Triage reporting Nasal Congestion.  Symptoms began yesterday.  Interventions attempted: Nothing.  Symptoms are: unchanged.  Triage Disposition: See Physician Within 24 Hours  Patient/caregiver understands and will follow disposition?: No, wishes to speak with PCP  Copied from CRM [id number]. Topic: Clinical - Red Word Triage >> Aug 07, 2024 10:50 AM Wess RAMAN wrote: Red Word that prompted transfer to Nurse Triage: Patient has a sinus infection and was prescribed amoxicillin -clavulanate (AUGMENTIN ) 875-125 MG tablet. She is still not feeling well. She is still congested, sore throat, cough, sneezing, right pressure.  She would like to get more amoxicillin -clavulanate (AUGMENTIN ) 875-125 MG tablet sent to the pharmacy  Pharmacy: [facility] [address] [place] - [address] [place] [address] [place] [address] Phone: [phone] Fax: [phone] Hours: Not open 24 hours Reason for Disposition  [1] Taking antibiotic > 72 hours (3 days) AND [2] sinus pain not improved  Answer Assessment - Initial Assessment Questions No available appts with pcp, patient declines appt and UC.  Advised call back and UC/ED if symptoms worsen.  Patient request alternative medication/antibiotic.  1. ANTIBIOTIC: What antibiotic are you taking? How many times a day?     Augmentin , finished 2 days ago 2. ONSET: When was the antibiotic started?     07/28/24; finished abt, started feeling better, then yesterday started back again Coughing, sneezing, congestion, right ear stuffiness, sore throat; no difficulties swallowing, not painful 3. PAIN: How bad is the pain?   (Scale 0-10; or none, mild, moderate or severe)     0/10 4. FEVER: Do you have a fever? If Yes, ask: What is it, how was it measured,  and when did it start?      Denies fever, chills, n/v/d 5. SYMPTOMS: Are there any other symptoms you're concerned about? If Yes, ask: When did it start? Still having same symptoms  Protocols used: Sinus Infection on Antibiotic Follow-up Call-[facility]

## 2024-08-13 ENCOUNTER — Ambulatory Visit: Payer: Self-pay

## 2024-08-13 NOTE — Telephone Encounter (Signed)
 FYI Only or Action Required?: FYI only for provider: appointment scheduled on 08/14/24.  Patient was last seen in primary care on 07/27/2024 by Cook, [name], DO.  Called Nurse Triage reporting Cough and Ear Fullness.  Symptoms began several days ago.  Interventions attempted: Prescription medications: Amoxicillin .  Symptoms are: gradually worsening.  Triage Disposition: See Physician Within 24 Hours  Patient/caregiver understands and will follow disposition?: Yes   Copied from CRM #[id number]. Topic: Clinical - Red Word Triage >> Aug 13, 2024  3:27 PM Montie POUR wrote: Red Word that prompted transfer to Nurse Triage:  She is still not feeling well. She is still congested, sore throat, cough, sneezing, right pressure. Her symptoms are getting worse. Reason for Disposition  Earache is present    Ear fullness like it's stopped up  Answer Assessment - Initial Assessment Questions 11/24 ago saw PCP for severe sinusitis. Prescribed for amoxicillin  for 14 days. Completed course and reported symptoms resolved. A few days ago onset of right ear fullness and return of dry cough. Scheduled appt with PCP tomorrow. Advised UC or ED for worsening symptoms.   1. ONSET: When did the cough begin?      A few days ago 2. SEVERITY: How bad is the cough today?      Moderate to severe 3. SPUTUM: Describe the color of your sputum (e.g., none, dry cough; clear, white, yellow, green)     Dry 4. HEMOPTYSIS: Are you coughing up any blood? If Yes, ask: How much? (e.g., flecks, streaks, tablespoons, etc.)     Denies 5. DIFFICULTY BREATHING: Are you having difficulty breathing? If Yes, ask: How bad is it? (e.g., mild, moderate, severe)      Denies 6. FEVER: Do you have a fever? If Yes, ask: What is your temperature, how was it measured, and when did it start?     Denies 7. CARDIAC HISTORY: Do you have any history of heart disease? (e.g., heart attack, congestive heart failure)       Denies 8. LUNG HISTORY: Do you have any history of lung disease?  (e.g., pulmonary embolus, asthma, emphysema)     Denies 9. PE RISK FACTORS: Do you have a history of blood clots? (or: recent major surgery, recent prolonged travel, bedridden)     Denies 10. OTHER SYMPTOMS: Do you have any other symptoms? (e.g., runny nose, wheezing, chest pain)       Right ear fullness, sneezing  Protocols used: Cough - Acute Non-Productive-A-AH

## 2024-08-14 ENCOUNTER — Encounter: Payer: Self-pay | Admitting: Family Medicine

## 2024-08-14 ENCOUNTER — Ambulatory Visit: Admitting: Family Medicine

## 2024-08-14 VITALS — BP 144/85 | HR 73 | Temp 97.2°F | Ht 63.0 in | Wt 198.6 lb

## 2024-08-14 DIAGNOSIS — H6591 Unspecified nonsuppurative otitis media, right ear: Secondary | ICD-10-CM

## 2024-08-14 MED ORDER — LEVOFLOXACIN 500 MG PO TABS: 500.0000 mg | ORAL_TABLET | Freq: Every day | ORAL | 0 refills | Status: AC

## 2024-08-14 NOTE — Patient Instructions (Signed)
 Medication as directed.  If you continue to have trouble, please let me know and I will refer you to ENT.

## 2024-08-16 DIAGNOSIS — H6590 Unspecified nonsuppurative otitis media, unspecified ear: Secondary | ICD-10-CM | POA: Insufficient documentation

## 2024-08-16 NOTE — Assessment & Plan Note (Signed)
 Given recent antibiotic use, treating with Levaquin .

## 2024-08-16 NOTE — Progress Notes (Signed)
 Subjective:  Patient ID: Melissa Wiley, female    DOB: 06-26-1951  Age: 73 y.o. MRN: [id number]  CC:   Chief Complaint  Patient presents with   Acute Visit    Cough w/ right ear fullness : sneezing, coughing, lft ear stopped up    HPI:  73 year old female presents for evaluation of the above.  Patient treated for sinusitis on 11/24. She reports that she had improvement but symptoms recurred. She reports post nasal drip, congestion, dry cough. Also reports right ear is stopped up. No fever. No other associated symptoms.  Patient Active Problem List   Diagnosis Date Noted   OME (otitis media with effusion) 08/16/2024   Acute sinusitis 07/27/2024   Chronic pain of both knees 12/11/2023   Multinodular thyroid  12/11/2022   Encephalomalacia on imaging study 10/09/2022   History of Helicobacter pylori infection 11/09/2021   Hyperlipidemia 11/09/2021   Osteopenia 11/09/2021   Prediabetes 03/22/2020   GERD (gastroesophageal reflux disease) 02/04/2019   Insomnia 10/26/2014   Anxiety and depression 10/26/2014   Essential hypertension, benign 02/03/2014    Social Hx   Social History   Socioeconomic History   Marital status: Single    Spouse name: Not on file   Number of children: 2   Years of education: Not on file   Highest education level: Not on file  Occupational History   Not on file  Tobacco Use   Smoking status: Never   Smokeless tobacco: Never  Vaping Use   Vaping status: Never Used  Substance and Sexual Activity   Alcohol use: Never   Drug use: Never   Sexual activity: Yes  Other Topics Concern   Not on file  Social History Narrative   SPECIAL NEEDS DAUGHTER PASSED IN 2019.   One daughter currently living with her and 1 granddaughter    2 living daughters.   6 grandchildren   6 great grandchildren   Social Drivers of Health   Tobacco Use: Low Risk (08/14/2024)   Patient History    Smoking Tobacco Use: Never    Smokeless Tobacco Use: Never     Passive Exposure: Not on file  Financial Resource Strain: Low Risk (08/16/2023)   Overall Financial Resource Strain (CARDIA)    Difficulty of Paying Living Expenses: Not hard at all  Food Insecurity: No Food Insecurity (08/16/2023)   Hunger Vital Sign    Worried About Running Out of Food in the Last Year: Never true    Ran Out of Food in the Last Year: Never true  Transportation Needs: No Transportation Needs (08/16/2023)   PRAPARE - Administrator, Civil Service (Medical): No    Lack of Transportation (Non-Medical): No  Physical Activity: Insufficiently Active (08/16/2023)   Exercise Vital Sign    Days of Exercise per Week: 7 days    Minutes of Exercise per Session: 20 min  Stress: Stress Concern Present (08/16/2023)   [employer] of Occupational Health - Occupational Stress Questionnaire    Feeling of Stress : To some extent  Social Connections: Unknown (08/16/2023)   Social Connection and Isolation Panel    Frequency of Communication with Friends and Family: More than three times a week    Frequency of Social Gatherings with Friends and Family: More than three times a week    Attends Religious Services: More than 4 times per year    Active Member of [employer] or Organizations: Yes    Attends Banker Meetings: More  than 4 times per year    Marital Status: Patient declined  Depression (PHQ2-9): Low Risk (07/27/2024)   Depression (PHQ2-9)    PHQ-2 Score: 1  Alcohol Screen: Low Risk (08/16/2023)   Alcohol Screen    Last Alcohol Screening Score (AUDIT): 0  Housing: Patient Declined (08/16/2023)   Housing Stability Vital Sign    Unable to Pay for Housing in the Last Year: Patient declined    Number of Times Moved in the Last Year: 0    Homeless in the Last Year: Patient declined  Utilities: Not At Risk (08/16/2023)   A[employer] Utilities    Threatened with loss of utilities: No  Health Literacy: Adequate Health Literacy (08/16/2023)   B1300 Health Literacy     Frequency of need for help with medical instructions: Never    Review of Systems Per HPI  Objective:  BP (!) 144/85   Pulse 73   Temp (!) 97.2 F (36.2 C)   Ht 5' 3 (1.6 m)   Wt 198 lb 9.6 oz (90.1 kg)   SpO2 98%   BMI 35.18 kg/m      08/14/2024   11:18 AM 08/14/2024   10:57 AM 07/27/2024    8:22 AM  BP/Weight  Systolic BP 144 146 111  Diastolic BP 85 90 74  Wt. (Lbs)  198.6 193  BMI  35.18 kg/m2 34.19 kg/m2    Physical Exam Vitals and nursing note reviewed.  Constitutional:      General: She is not in acute distress.    Appearance: Normal appearance.  HENT:     Head: Normocephalic and atraumatic.     Ears:     Comments: Right TM with effusion. Cardiovascular:     Rate and Rhythm: Normal rate and regular rhythm.  Pulmonary:     Effort: Pulmonary effort is normal.     Breath sounds: Normal breath sounds. No wheezing or rales.  Neurological:     Mental Status: She is alert.     Lab Results  Component Value Date   WBC 8.0 04/22/2024   HGB 11.7 04/22/2024   HCT 38.9 04/22/2024   PLT 252 04/22/2024   GLUCOSE 113 (H) 04/22/2024   CHOL 146 04/22/2024   TRIG 198 (H) 04/22/2024   HDL 42 04/22/2024   LDLCALC 71 04/22/2024   ALT 9 04/22/2024   AST 11 04/22/2024   NA 140 04/22/2024   K 4.1 04/22/2024   CL 99 04/22/2024   CREATININE 0.77 04/22/2024   BUN 18 04/22/2024   CO2 24 04/22/2024   TSH 1.450 11/12/2019   HGBA1C 5.8 (H) 04/22/2024     Assessment & Plan:  Right otitis media with effusion Assessment & Plan: Given recent antibiotic use, treating with Levaquin .   Other orders -     levoFLOXacin ; Take 1 tablet (500 mg total) by mouth daily for 7 days.  Dispense: 7 tablet; Refill: 0    Follow-up:  Return if symptoms worsen or fail to improve.  Jacqulyn Ahle DO [facility]

## 2024-09-09 ENCOUNTER — Telehealth: Payer: Self-pay | Admitting: *Deleted

## 2024-09-09 NOTE — Telephone Encounter (Signed)
 Copied from CRM [id number]. Topic: Clinical - Medication Question >> Sep 07, 2024 10:03 AM Delon DASEN wrote: Reason for CRM: Insurance made changes to formulary- Paxil  and Xanax  have to be generic and she has a list to choose from, please call to discuss [phone]

## 2024-09-10 NOTE — Telephone Encounter (Signed)
 I've called the patient to let her know this information regarding her generic prescriptions.

## 2024-09-10 NOTE — Telephone Encounter (Signed)
 Cook, [name], DO      09/09/24  4:21 PM I only use generic medications

## 2024-09-24 ENCOUNTER — Other Ambulatory Visit: Payer: Self-pay | Admitting: Family Medicine

## 2024-10-01 ENCOUNTER — Ambulatory Visit

## 2024-10-05 ENCOUNTER — Other Ambulatory Visit: Payer: Self-pay

## 2024-10-05 ENCOUNTER — Emergency Department (HOSPITAL_COMMUNITY)
Admission: EM | Admit: 2024-10-05 | Discharge: 2024-10-05 | Disposition: A | Discharge status: Home / Self Care | Attending: Emergency Medicine | Admitting: Emergency Medicine

## 2024-10-05 ENCOUNTER — Emergency Department (HOSPITAL_COMMUNITY)

## 2024-10-05 DIAGNOSIS — S92345A Nondisplaced fracture of fourth metatarsal bone, left foot, initial encounter for closed fracture: Secondary | ICD-10-CM | POA: Insufficient documentation

## 2024-10-05 DIAGNOSIS — S8261XA Displaced fracture of lateral malleolus of right fibula, initial encounter for closed fracture: Secondary | ICD-10-CM | POA: Insufficient documentation

## 2024-10-05 DIAGNOSIS — W010XXA Fall on same level from slipping, tripping and stumbling without subsequent striking against object, initial encounter: Secondary | ICD-10-CM | POA: Insufficient documentation

## 2024-10-05 DIAGNOSIS — Z7982 Long term (current) use of aspirin: Secondary | ICD-10-CM | POA: Insufficient documentation

## 2024-10-05 DIAGNOSIS — W19XXXA Unspecified fall, initial encounter: Secondary | ICD-10-CM

## 2024-10-05 NOTE — ED Provider Notes (Signed)
 "  EMERGENCY DEPARTMENT AT [facility] Provider Note   CSN: [id number] Arrival date & time: 10/05/24  1214     Patient presents with: Melissa Wiley is a 74 y.o. female.   HPI Presents after a fall that occurred yesterday with ongoing pain in right ankle, right foot.  No head trauma, loss of consciousness, weakness in any extremity.  Minimal relief with OTC medication and Ace wrap.    Prior to Admission medications  Medication Sig Start Date End Date Taking? Authorizing Provider  ALPRAZolam  (XANAX ) 0.5 MG tablet Take 1 tablet by mouth twice daily as needed for anxiety 09/26/24   Cook, [name], DO  amoxicillin -clavulanate (AUGMENTIN ) 875-125 MG tablet Take 1 tablet by mouth 2 (two) times daily. 07/27/24   Cook, [name], DO  aspirin EC 81 MG tablet Take 81 mg by mouth daily. Swallow whole.    [provider]  enalapril  (VASOTEC ) 20 MG tablet Take 1 tablet (20 mg total) by mouth daily. 12/11/23   Cook, [name], DO  hydrochlorothiazide  (HYDRODIURIL ) 25 MG tablet Take 1/2 (one-half) tablet by mouth once daily 05/12/24   Cook, [name], DO  linaclotide  (LINZESS ) 290 MCG CAPS capsule Take 1 capsule (290 mcg total) by mouth daily before breakfast. 01/01/24 12/31/24  Carver, Charles K, DO  Multiple Vitamins-Minerals (MULTIVITAMIN WITH MINERALS) tablet Take 1 tablet by mouth daily.    [provider]  naproxen  (NAPROSYN ) 500 MG tablet Take 1 tablet (500 mg total) by mouth 2 (two) times daily as needed for moderate pain (pain score 4-6). Take with food. 12/11/23   Cook, [name], DO  Omega-3 Fatty Acids (FISH OIL) 1000 MG CAPS Take 1,000 mg by mouth daily.    [provider]  PARoxetine  (PAXIL ) 20 MG tablet TAKE 2 TABLETS BY MOUTH IN THE MORNING 06/03/24   Cook, [name], DO  rosuvastatin  (CRESTOR ) 10 MG tablet Take 1 tablet (10 mg total) by mouth daily. 06/19/23   Cook, [name], DO    Allergies: Patient has no known allergies.    Review of  Systems  Updated Vital Signs BP (!) 145/85 (BP Location: Right Arm)   Pulse 80   Temp 98.9 F (37.2 C) (Oral)   Resp 16   Ht 1.6 m (5' 3)   Wt 87.5 kg   SpO2 99%   BMI 34.19 kg/m   Physical Exam Vitals and nursing note reviewed.  Constitutional:      General: She is not in acute distress.    Appearance: She is well-developed.  HENT:     Head: Normocephalic and atraumatic.  Eyes:     Conjunctiva/sclera: Conjunctivae normal.  Cardiovascular:     Rate and Rhythm: Normal rate and regular rhythm.     Pulses: Normal pulses.  Pulmonary:     Effort: Pulmonary effort is normal. No respiratory distress.     Breath sounds: No stridor.  Abdominal:     General: There is no distension.  Musculoskeletal:       Legs:  Skin:    General: Skin is warm and dry.  Neurological:     Mental Status: She is alert and oriented to person, place, and time.     Cranial Nerves: No cranial nerve deficit.  Psychiatric:        Mood and Affect: Mood normal.     (all labs ordered are listed, but only abnormal results are displayed) Labs Reviewed - No data to display  EKG: None  Radiology: DG Ankle Complete Right Result Date: 10/05/2024 CLINICAL DATA:  RIGHT ankle and foot pain status post recent fall EXAM: RIGHT ANKLE - COMPLETE 3+ VIEW; RIGHT FOOT COMPLETE - 3+ VIEW COMPARISON:  None available FINDINGS: RIGHT foot: Mild degenerative changes of the first metatarsophalangeal joint. Mildly angulated fracture of the distal fourth metatarsal. Mild enthesopathic spurring at the insertion of the plantar fascia on the calcaneus. RIGHT ankle: Rounded lucency in the medial talar dome may be related to an osteochondral defect. Curvilinear ossific fragment seen adjacent to the tip of the lateral malleolus, best seen on the oblique view is suspicious for an avulsion fracture. Moderate soft tissue swelling overlying the lateral malleolus. IMPRESSION: 1. Mildly angulated fracture of the distal fourth metatarsal.  2. Curvilinear ossific fragment adjacent to the tip of the lateral malleolus is suspicious for an avulsion fracture. 3. Rounded lucency in the medial talar dome may be related to an osteochondral defect. Electronically Signed   By: Aliene Lloyd M.D.   On: 10/05/2024 14:48   DG Foot Complete Right Result Date: 10/05/2024 CLINICAL DATA:  RIGHT ankle and foot pain status post recent fall EXAM: RIGHT ANKLE - COMPLETE 3+ VIEW; RIGHT FOOT COMPLETE - 3+ VIEW COMPARISON:  None available FINDINGS: RIGHT foot: Mild degenerative changes of the first metatarsophalangeal joint. Mildly angulated fracture of the distal fourth metatarsal. Mild enthesopathic spurring at the insertion of the plantar fascia on the calcaneus. RIGHT ankle: Rounded lucency in the medial talar dome may be related to an osteochondral defect. Curvilinear ossific fragment seen adjacent to the tip of the lateral malleolus, best seen on the oblique view is suspicious for an avulsion fracture. Moderate soft tissue swelling overlying the lateral malleolus. IMPRESSION: 1. Mildly angulated fracture of the distal fourth metatarsal. 2. Curvilinear ossific fragment adjacent to the tip of the lateral malleolus is suspicious for an avulsion fracture. 3. Rounded lucency in the medial talar dome may be related to an osteochondral defect. Electronically Signed   By: Aliene Lloyd M.D.   On: 10/05/2024 14:48     Procedures   Medications Ordered in the ED - No data to display                                  Medical Decision Making Well-appearing adult female presents after fall that occurred yesterday with ongoing foot and ankle pain.  Patient is awake, alert, distally neurovasc intact, but has pain, swelling concerning for fracture versus soft tissue injury ankle, foot.  X-rays ordered.  Amount and/or Complexity of Data Reviewed Independent Historian: spouse Radiology: ordered and independent interpretation performed. Decision-making details documented in  ED Course.  Risk Prescription drug management.   3:15 PM On repeat exam patient in no distress, vital signs normal.  I reviewed and demonstrated the images from her x-rays to her and her female companion at bedside.  Evidence for lateral malleolus avulsion fracture, fourth metatarsal fracture demonstrated.  Patient had immobilization with CAM Walker, will follow-up with orthopedics.     Final diagnoses:  Fall, initial encounter  Closed avulsion fracture of lateral malleolus of right fibula, initial encounter  Nondisplaced fracture of fourth metatarsal bone, left foot, initial encounter for closed fracture    ED Discharge Orders     None          Garrick Charleston, MD 10/05/24 1515  "

## 2024-10-05 NOTE — Discharge Instructions (Addendum)
 Ice, Tylenol , ibuprofen are all appropriate for pain control.  Follow-up with our orthopedic colleagues.

## 2024-10-05 NOTE — ED Triage Notes (Signed)
 Patient arrives POV from home c/c two falls. Slipped on ice and had a GLF to her knees on concrete. Currently endorsing R ankle and foot pain. Ambulated independently into triage.

## 2024-10-21 ENCOUNTER — Ambulatory Visit: Admitting: Orthopedic Surgery
